# Patient Record
Sex: Female | Born: 1950
Health system: Southern US, Community
[De-identification: ages and names within clinical notes are randomized; demographics above are authoritative.]

## PROBLEM LIST (undated history)

## (undated) DIAGNOSIS — E7800 Pure hypercholesterolemia, unspecified: Secondary | ICD-10-CM

## (undated) DIAGNOSIS — I10 Essential (primary) hypertension: Secondary | ICD-10-CM

## (undated) HISTORY — DX: Essential (primary) hypertension: I10

---

## 2006-02-27 ENCOUNTER — Emergency Department: Payer: Self-pay | Admitting: Unknown Physician Specialty

## 2006-02-27 IMAGING — CR DG KNEE COMPLETE 4+V*R*
1 series · 4 of 4 positions shown · non-contrast
Comparison: none

REASON FOR EXAM: MULTIPLE FALLS
COMMENTS:

PROCEDURE:     DXR - DXR KNEE RT COMP WITH OBLIQUES  - [DATE]  [DATE]
RESULT:     No fracture, dislocation or other acute bony abnormality is
seen. The knee joint space is well maintained. The patella is intact.  There
is slight degenerative spurring about the knee medially.

[Series 1: view not recorded · 0.17mm/px · 4 of 4 slices shown]
[im 1/4]
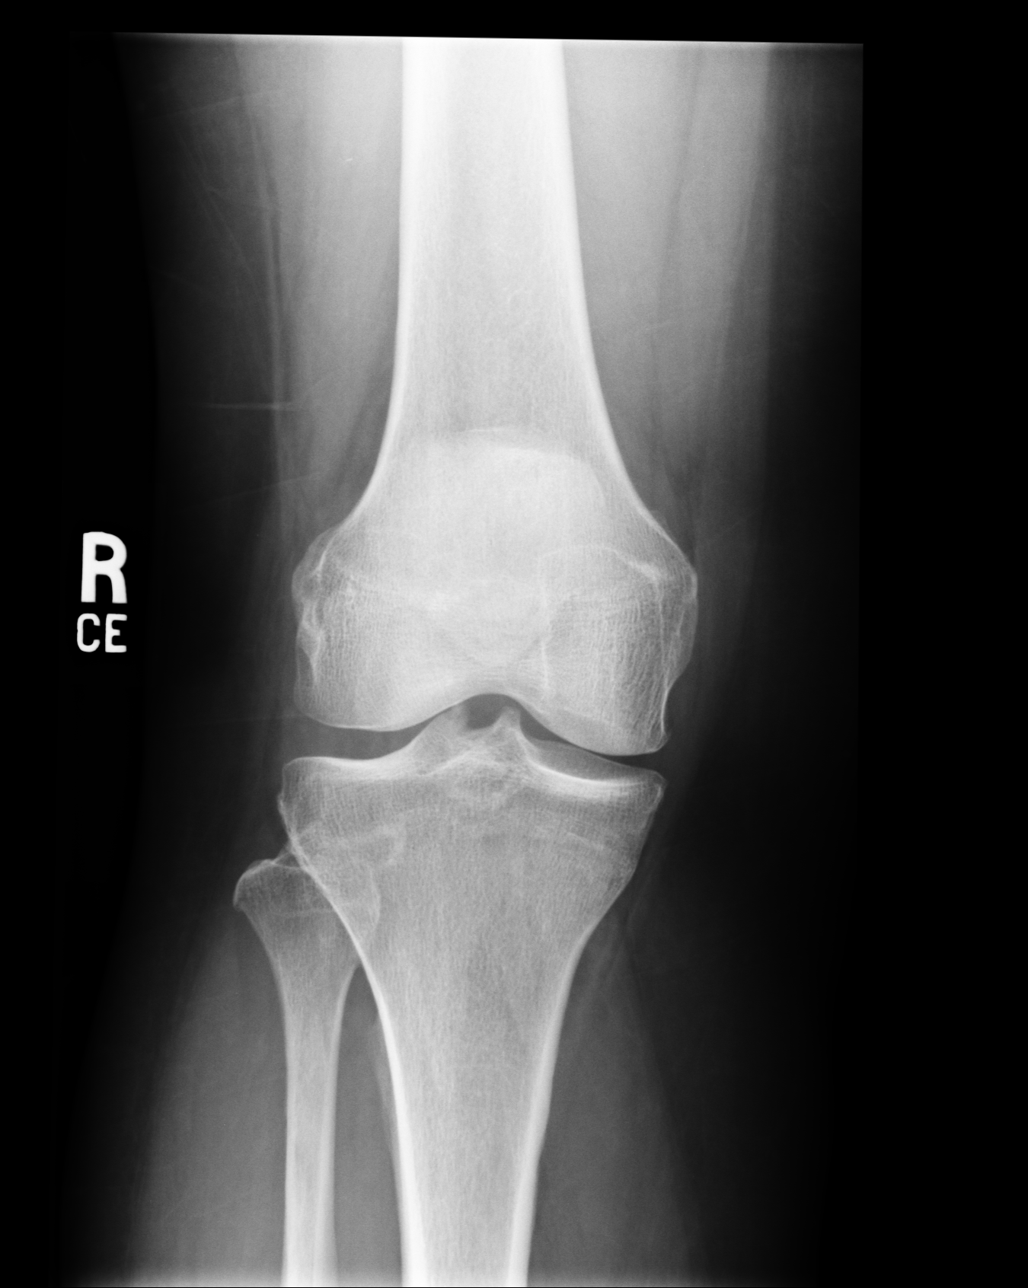
[im 2/4]
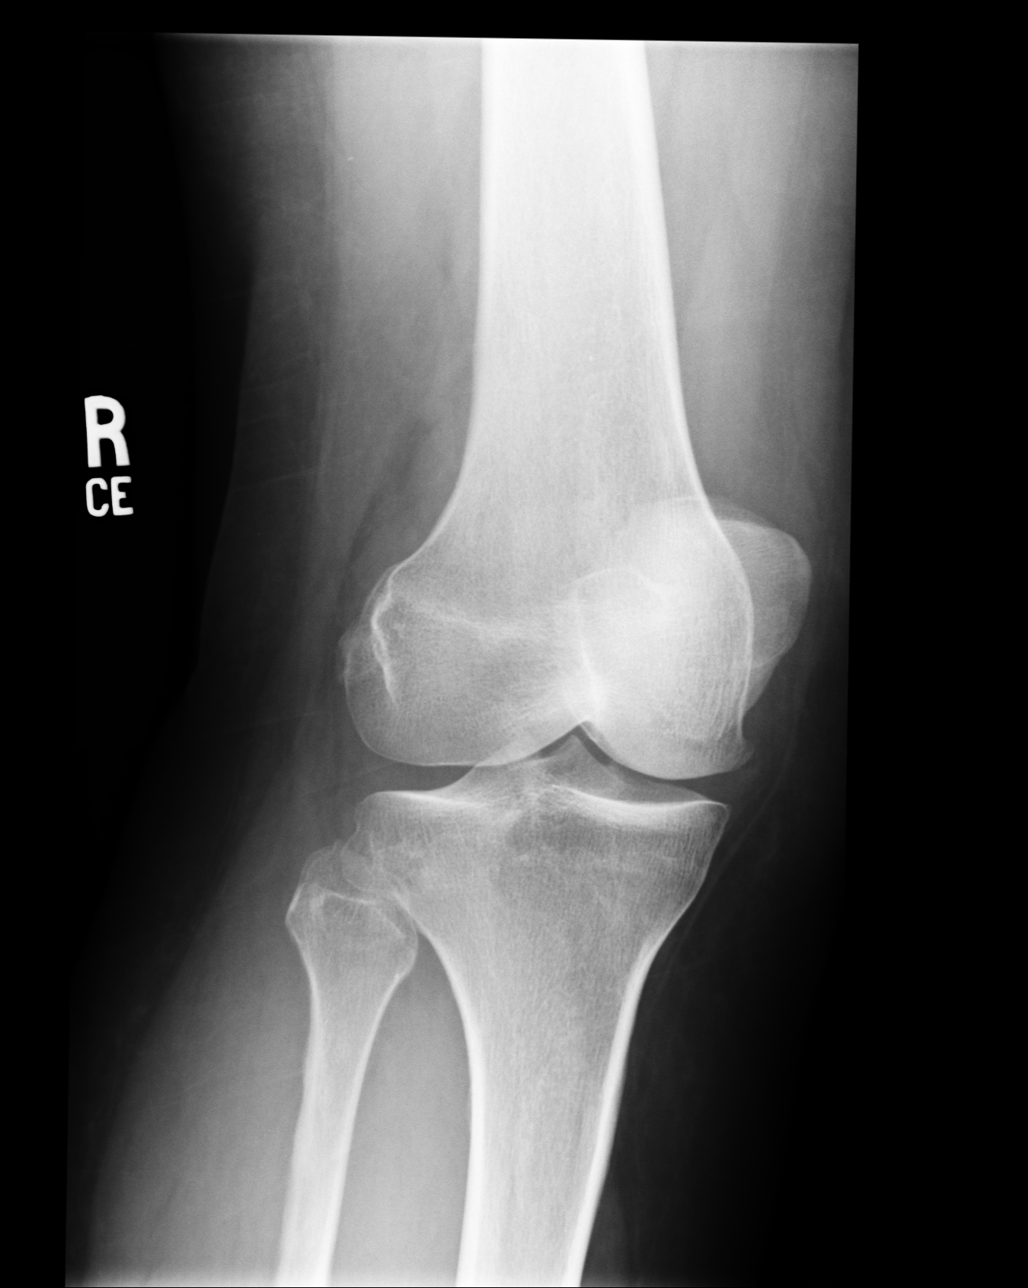
[im 3/4]
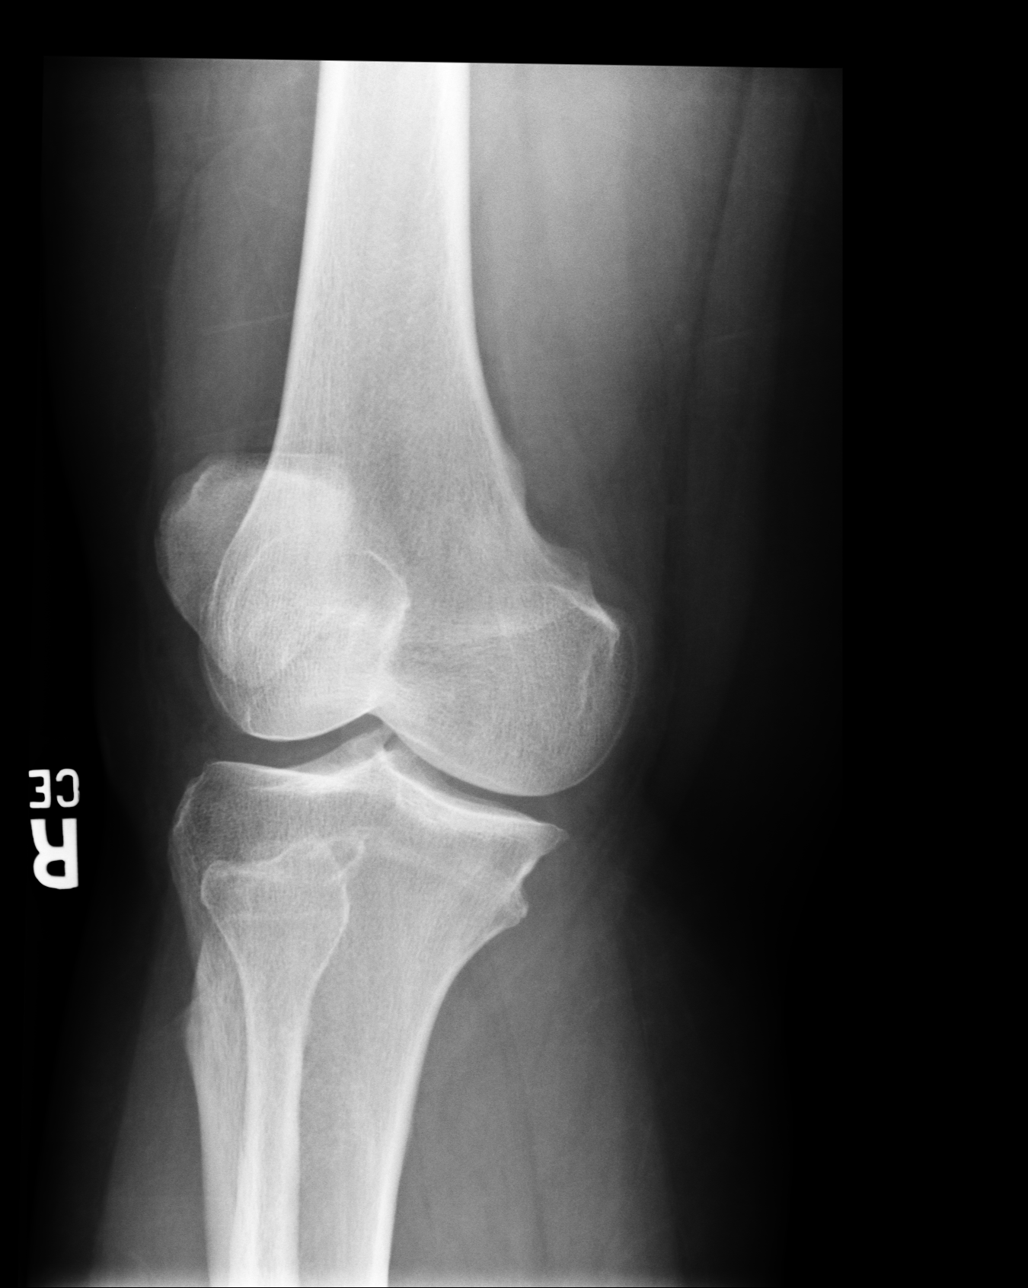
[im 4/4]
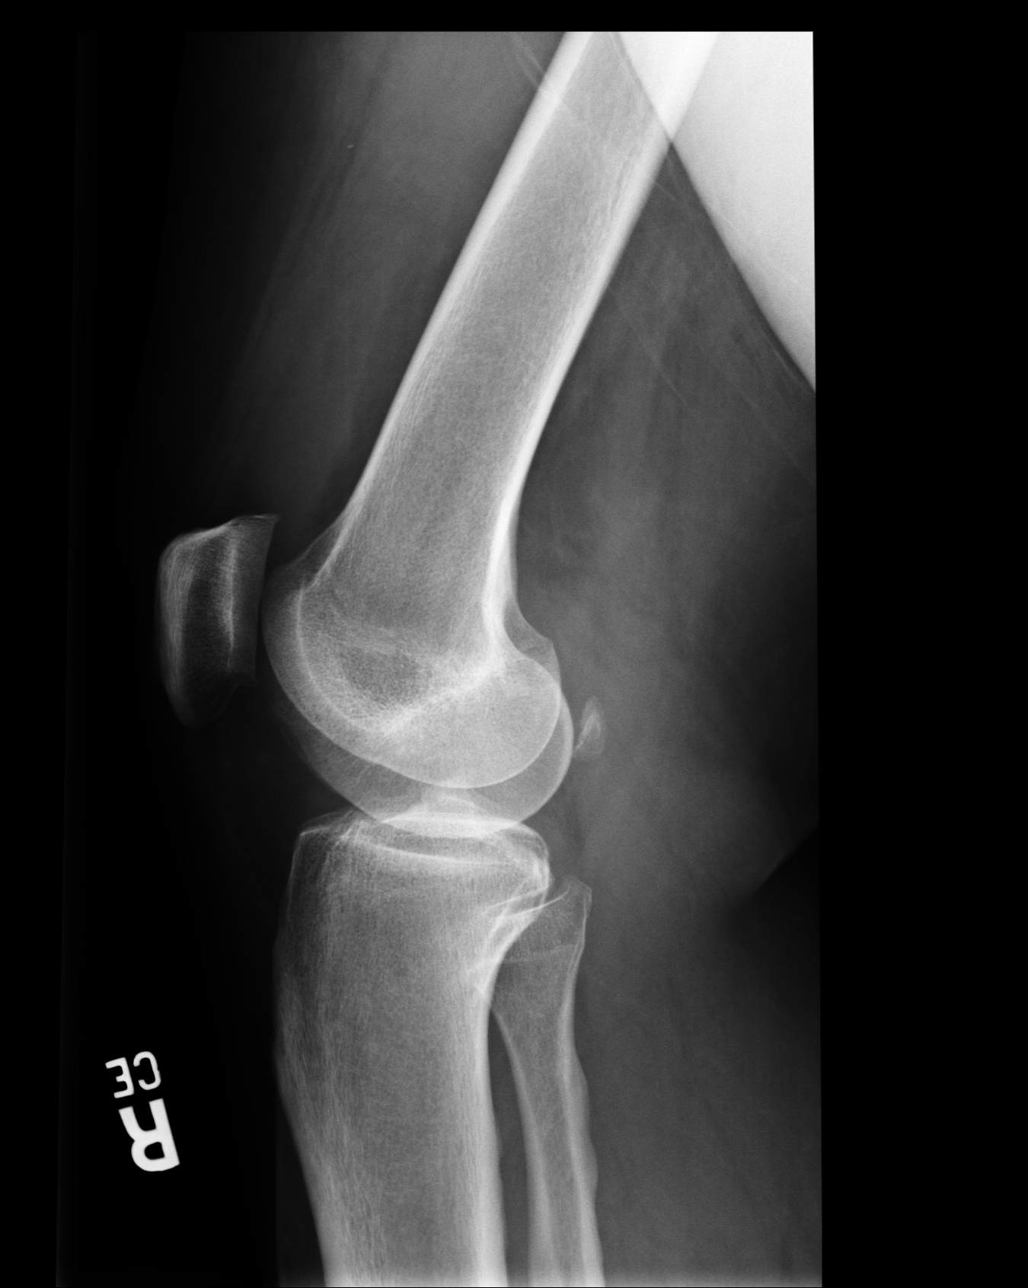

[4 of 4 positions shown; findings below may reference images not displayed]

IMPRESSION: 1)No acute bony abnormalities are seen.

2)There is slight degenerative spurring about the knee medially.

## 2007-03-08 ENCOUNTER — Other Ambulatory Visit: Payer: Self-pay

## 2007-03-08 ENCOUNTER — Emergency Department: Payer: Self-pay | Admitting: Emergency Medicine

## 2007-03-08 IMAGING — CR DG CHEST 2V
1 series · 2 of 2 positions shown · non-contrast
Comparison: none

REASON FOR EXAM: chest pain rm 7
COMMENTS:

[Series 1: view not recorded · 0.17mm/px · 2 of 2 slices shown]
[im 1/2]
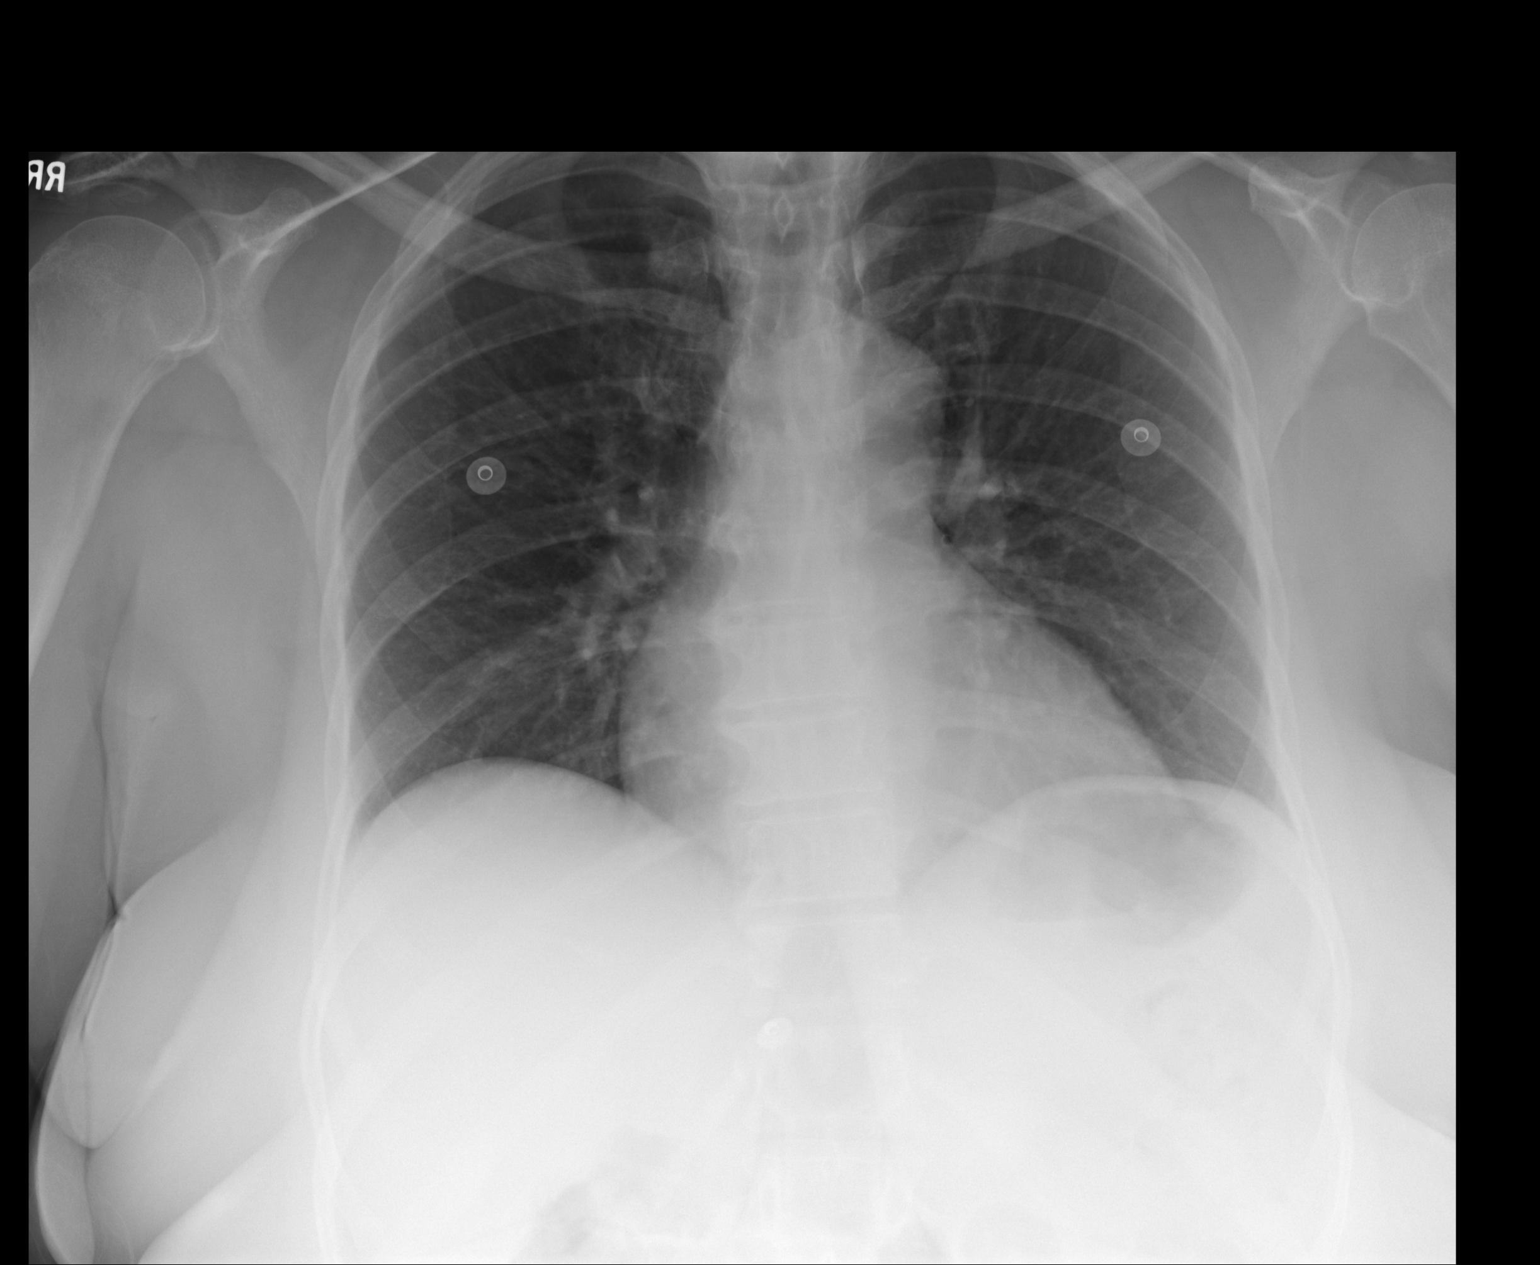
[im 2/2]
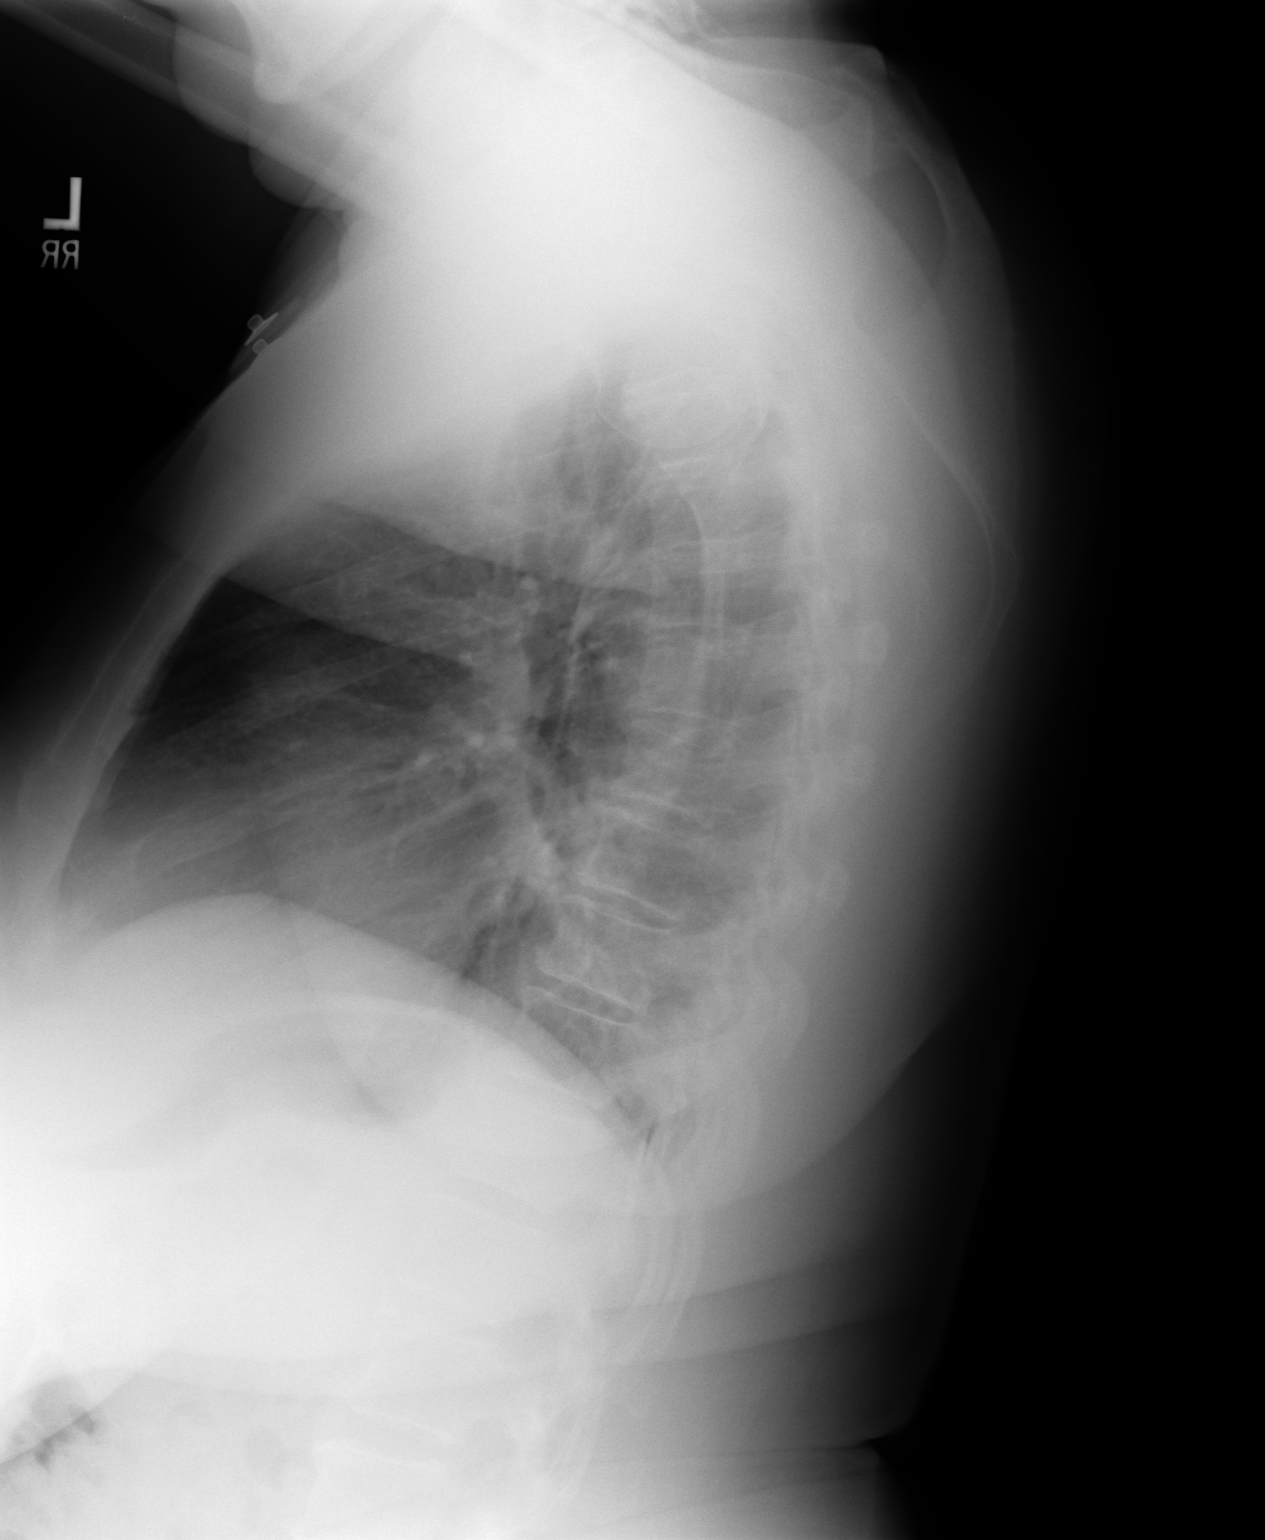

[2 of 2 positions shown; findings below may reference images not displayed]

PROCEDURE:     DXR - DXR CHEST PA (OR AP) AND LATERAL  - [DATE] [DATE]

RESULT:     The lungs are mildly hypoinflated. There is no focal infiltrate.
There is overlying soft tissue density which does increase the density of
the lung parenchyma over the thoracic spine on the lateral film. On the
frontal view this area is clear. The heart is not enlarged.
IMPRESSION: I do not see evidence of acute cardiopulmonary abnormality. Followup films
are recommended if patient has persistent symptoms.

## 2013-09-02 ENCOUNTER — Emergency Department: Payer: Self-pay | Admitting: Internal Medicine

## 2013-09-02 IMAGING — CR DG CHEST 2V
1 series · 2 of 2 positions shown · non-contrast
Comparison: none

REASON FOR EXAM: cough
COMMENTS:   LMP: Post-Menopausal

PROCEDURE:     DXR - DXR CHEST PA (OR AP) AND LATERAL  - [DATE]  [DATE]
RESULT:     The lungs are well-expanded and clear. The cardiac silhouette is
normal in size. The pulmonary vascularity is not engorged. The mediastinum
is normal in width. The observed portions of the bony thorax are normal.

[Series 1: w chest pa · 0.14mm/px · 2 of 2 slices shown]
[im 1/2]
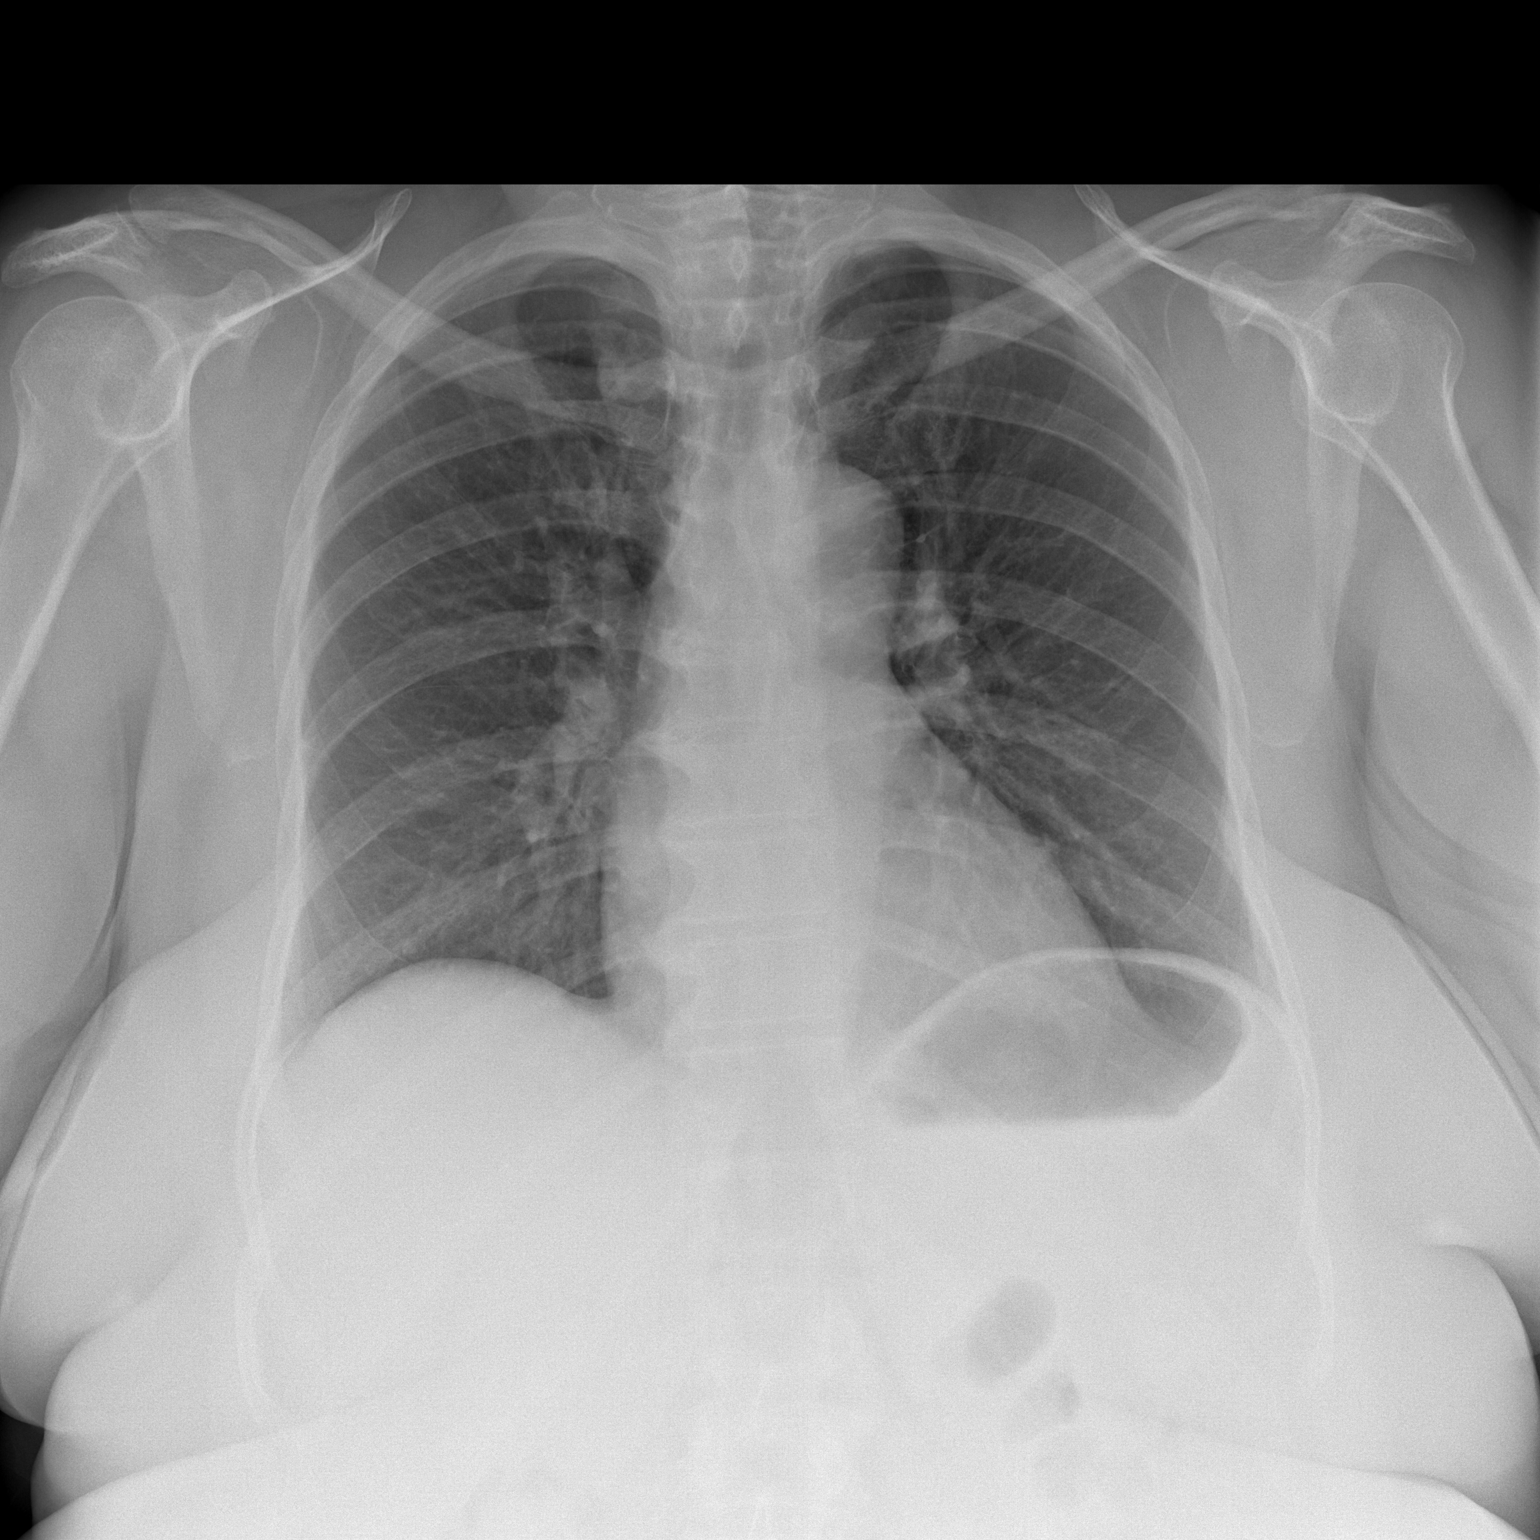
[im 2/2]
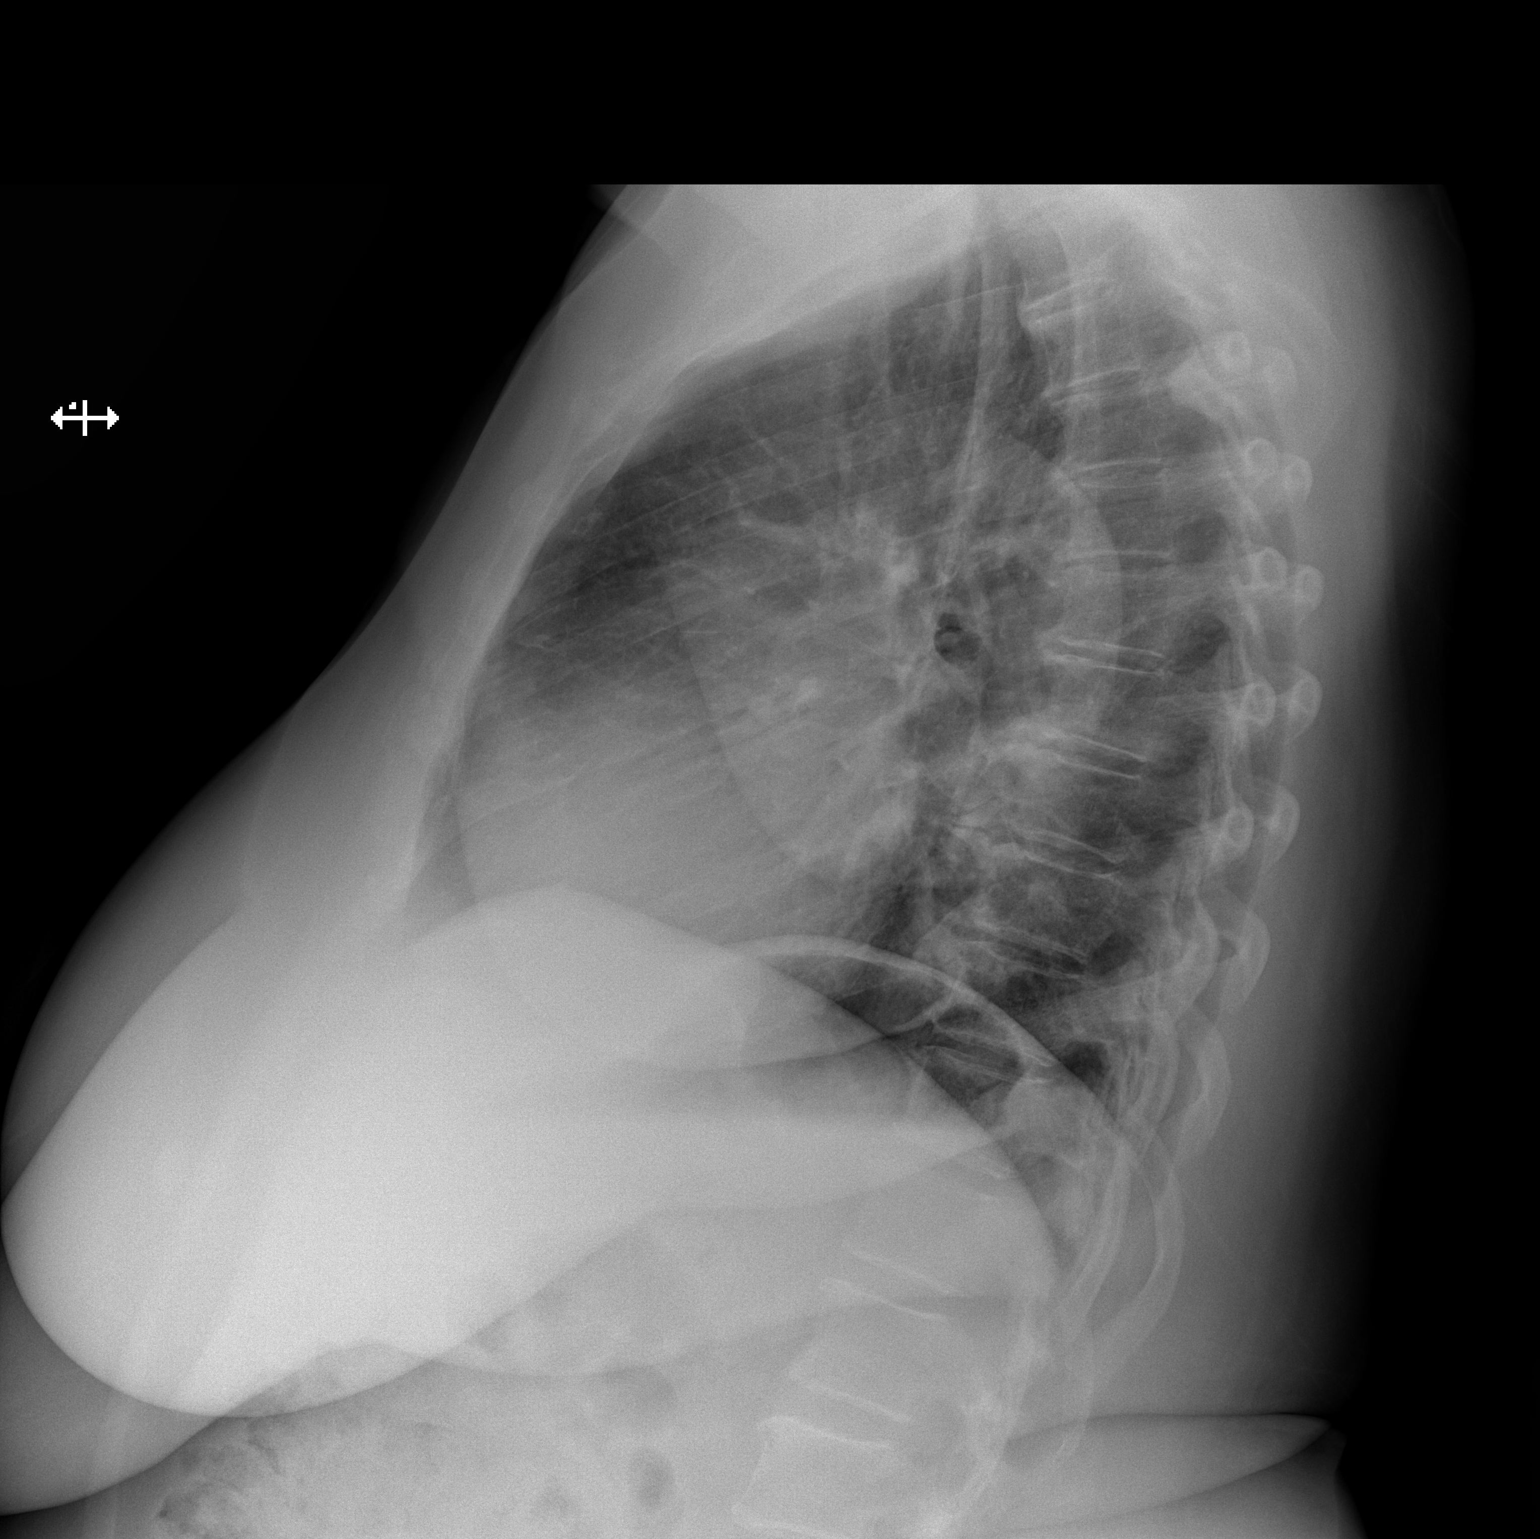

[2 of 2 positions shown; findings below may reference images not displayed]

IMPRESSION: There is no evidence of acute cardiopulmonary abnormality.

[REDACTED]

## 2013-12-13 ENCOUNTER — Emergency Department: Payer: Self-pay | Admitting: Emergency Medicine

## 2013-12-13 IMAGING — CR DG CHEST 2V
1 series · 2 of 2 positions shown · non-contrast
Comparison: PA and lateral chest [DATE].

CLINICAL DATA: Cough beginning [DATE].

EXAM:
CHEST  2 VIEW

[Series 1: w chest pa · 0.14mm/px · 2 of 2 slices shown]
[im 1/2]
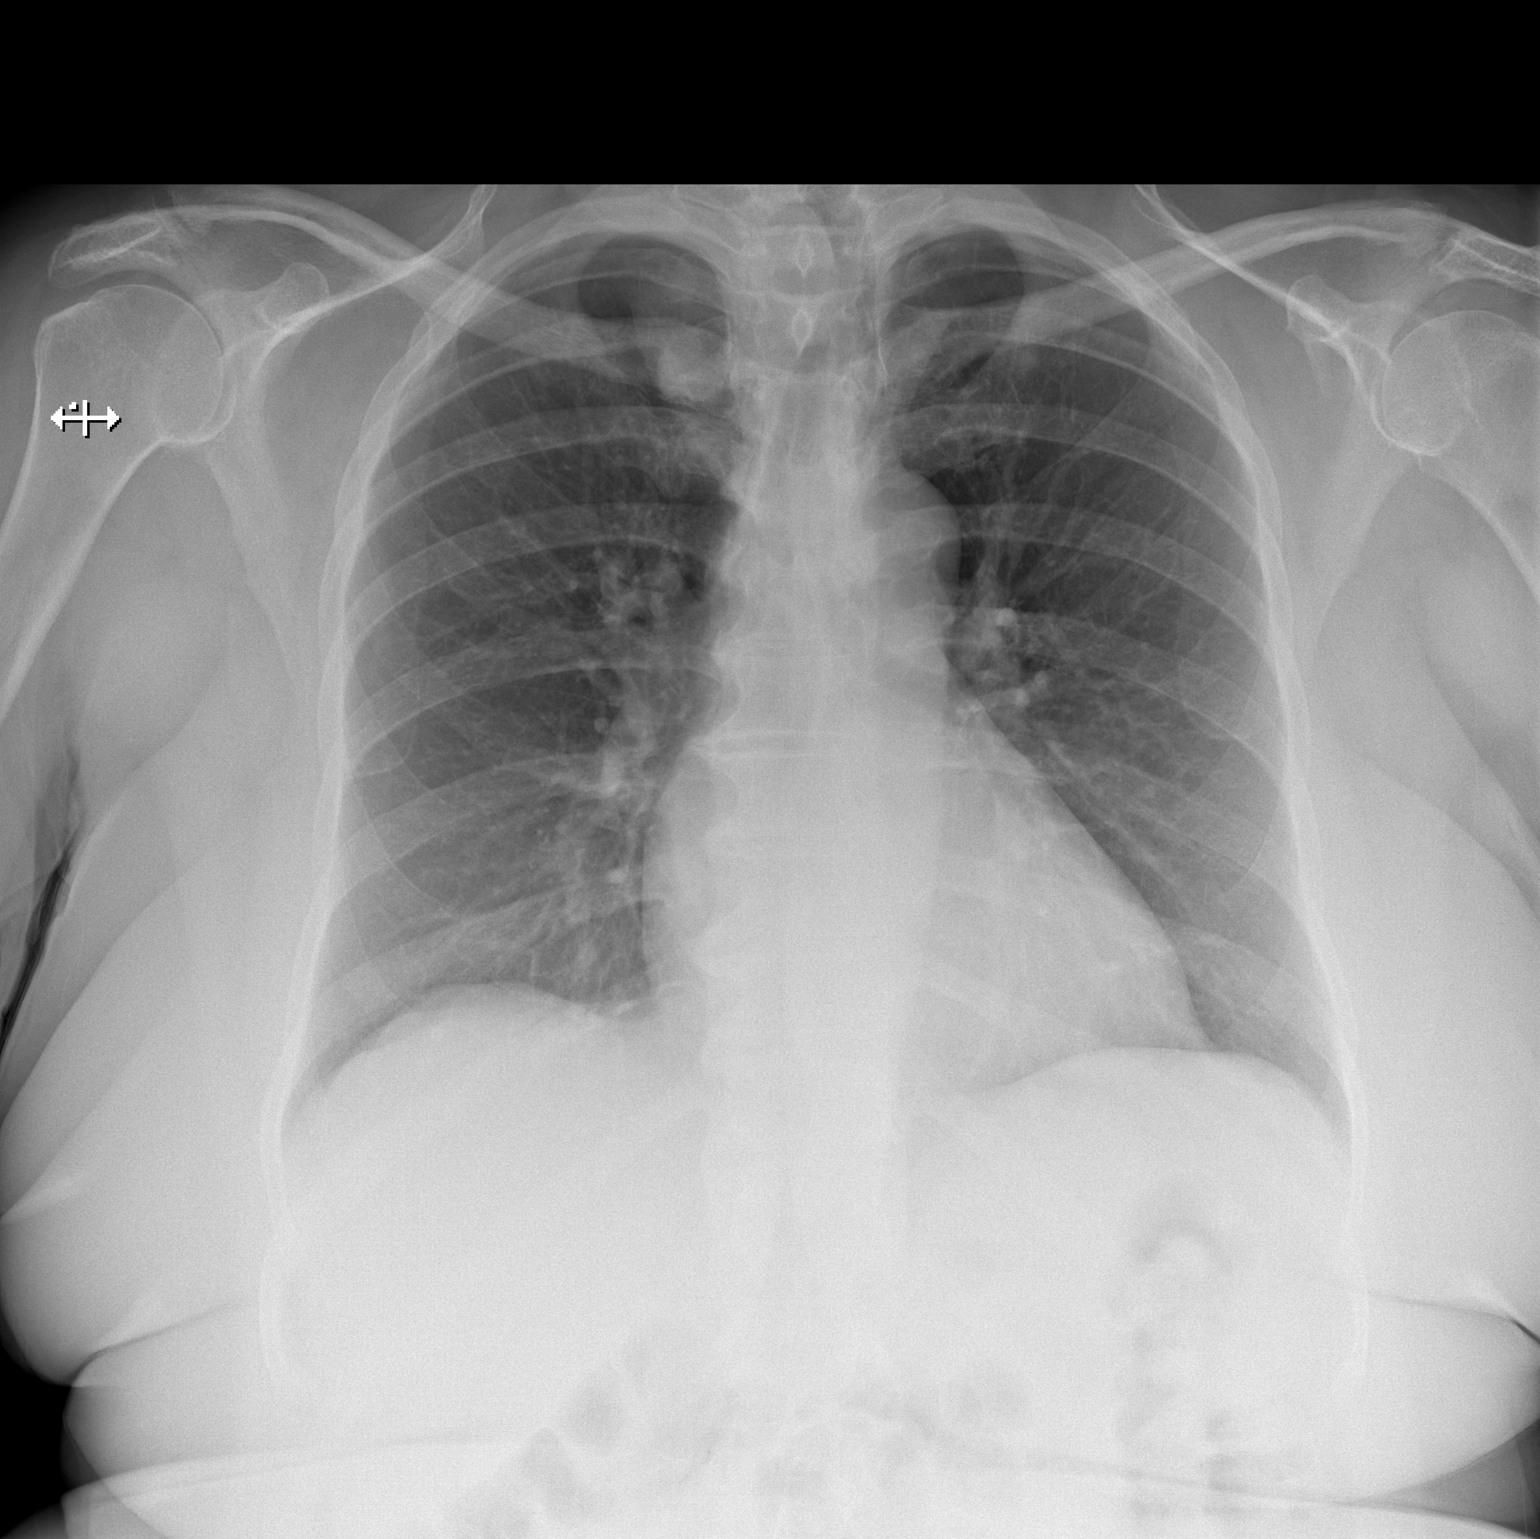
[im 2/2]
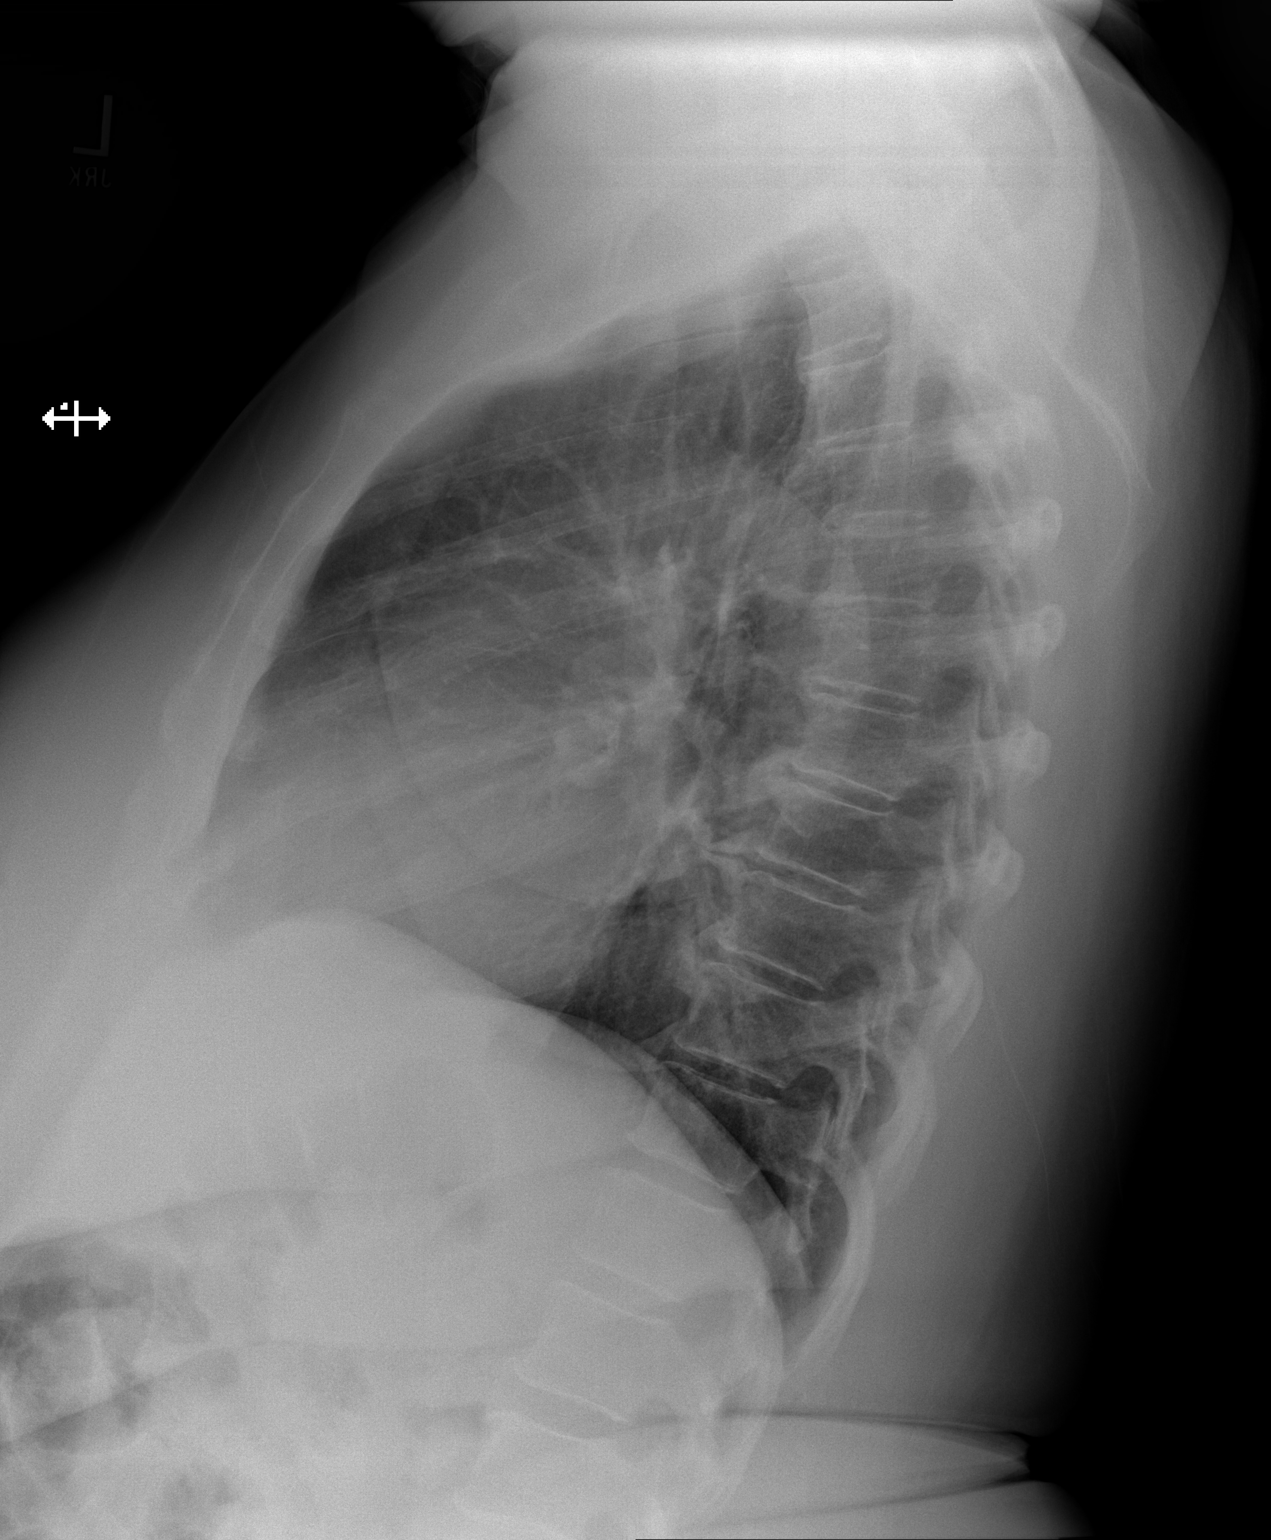

[2 of 2 positions shown; findings below may reference images not displayed]

FINDINGS: Heart size and mediastinal contours are within normal limits. Both
lungs are clear. Visualized skeletal structures are unremarkable.
IMPRESSION: Negative exam.

## 2013-12-14 LAB — RAPID INFLUENZA A&B ANTIGENS (ARMC ONLY)

## 2014-02-20 ENCOUNTER — Emergency Department: Payer: Self-pay | Admitting: Emergency Medicine

## 2014-02-20 LAB — TROPONIN I: Troponin-I: <0.02 ng/mL

## 2014-02-20 LAB — CBC WITH DIFFERENTIAL/PLATELET
BASOS ABS: 0.1 10*3/uL (ref 0.0–0.1)
Basophil %: 1.1 %
EOS PCT: 0.4 %
Eosinophil #: 0 10*3/uL (ref 0.0–0.7)
HCT: 38 % (ref 35.0–47.0)
HGB: 12.6 g/dL (ref 12.0–16.0)
Lymphocyte #: 1.6 10*3/uL (ref 1.0–3.6)
Lymphocyte %: 29 %
MCH: 28.7 pg (ref 26.0–34.0)
MCHC: 33 g/dL (ref 32.0–36.0)
MCV: 87 fL (ref 80–100)
MONOS PCT: 6.3 %
Monocyte #: 0.3 x10 3/mm (ref 0.2–0.9)
NEUTROS ABS: 3.4 10*3/uL (ref 1.4–6.5)
Neutrophil %: 63.2 %
Platelet: 324 10*3/uL (ref 150–440)
RBC: 4.37 10*6/uL (ref 3.80–5.20)
RDW: 15.1 % — AB (ref 11.5–14.5)
WBC: 5.4 10*3/uL (ref 3.6–11.0)

## 2014-02-20 LAB — URINALYSIS, COMPLETE
BLOOD: NEGATIVE
Bacteria: NONE SEEN
Bilirubin,UR: NEGATIVE
Glucose,UR: NEGATIVE mg/dL (ref 0–75)
Ketone: NEGATIVE
LEUKOCYTE ESTERASE: NEGATIVE
NITRITE: NEGATIVE
PH: 7 (ref 4.5–8.0)
PROTEIN: NEGATIVE
Specific Gravity: 1.01 (ref 1.003–1.030)
Squamous Epithelial: NONE SEEN

## 2014-02-20 LAB — BASIC METABOLIC PANEL
Anion Gap: 5 — ABNORMAL LOW (ref 7–16)
BUN: 11 mg/dL (ref 7–18)
CALCIUM: 9.1 mg/dL (ref 8.5–10.1)
Chloride: 106 mmol/L (ref 98–107)
Co2: 27 mmol/L (ref 21–32)
Creatinine: 0.71 mg/dL (ref 0.60–1.30)
GLUCOSE: 97 mg/dL (ref 65–99)
Osmolality: 275 (ref 275–301)
POTASSIUM: 4.2 mmol/L (ref 3.5–5.1)
SODIUM: 138 mmol/L (ref 136–145)

## 2014-02-20 IMAGING — CR DG CHEST 1V PORT
1 series · 1 of 1 positions shown · non-contrast
Comparison: DG CHEST 2V dated [DATE]; DG CHEST 2V dated
[DATE]; DG CHEST 2V dated [DATE]

CLINICAL DATA: Weakness and dizziness.

EXAM:
PORTABLE CHEST - 1 VIEW

[ap]
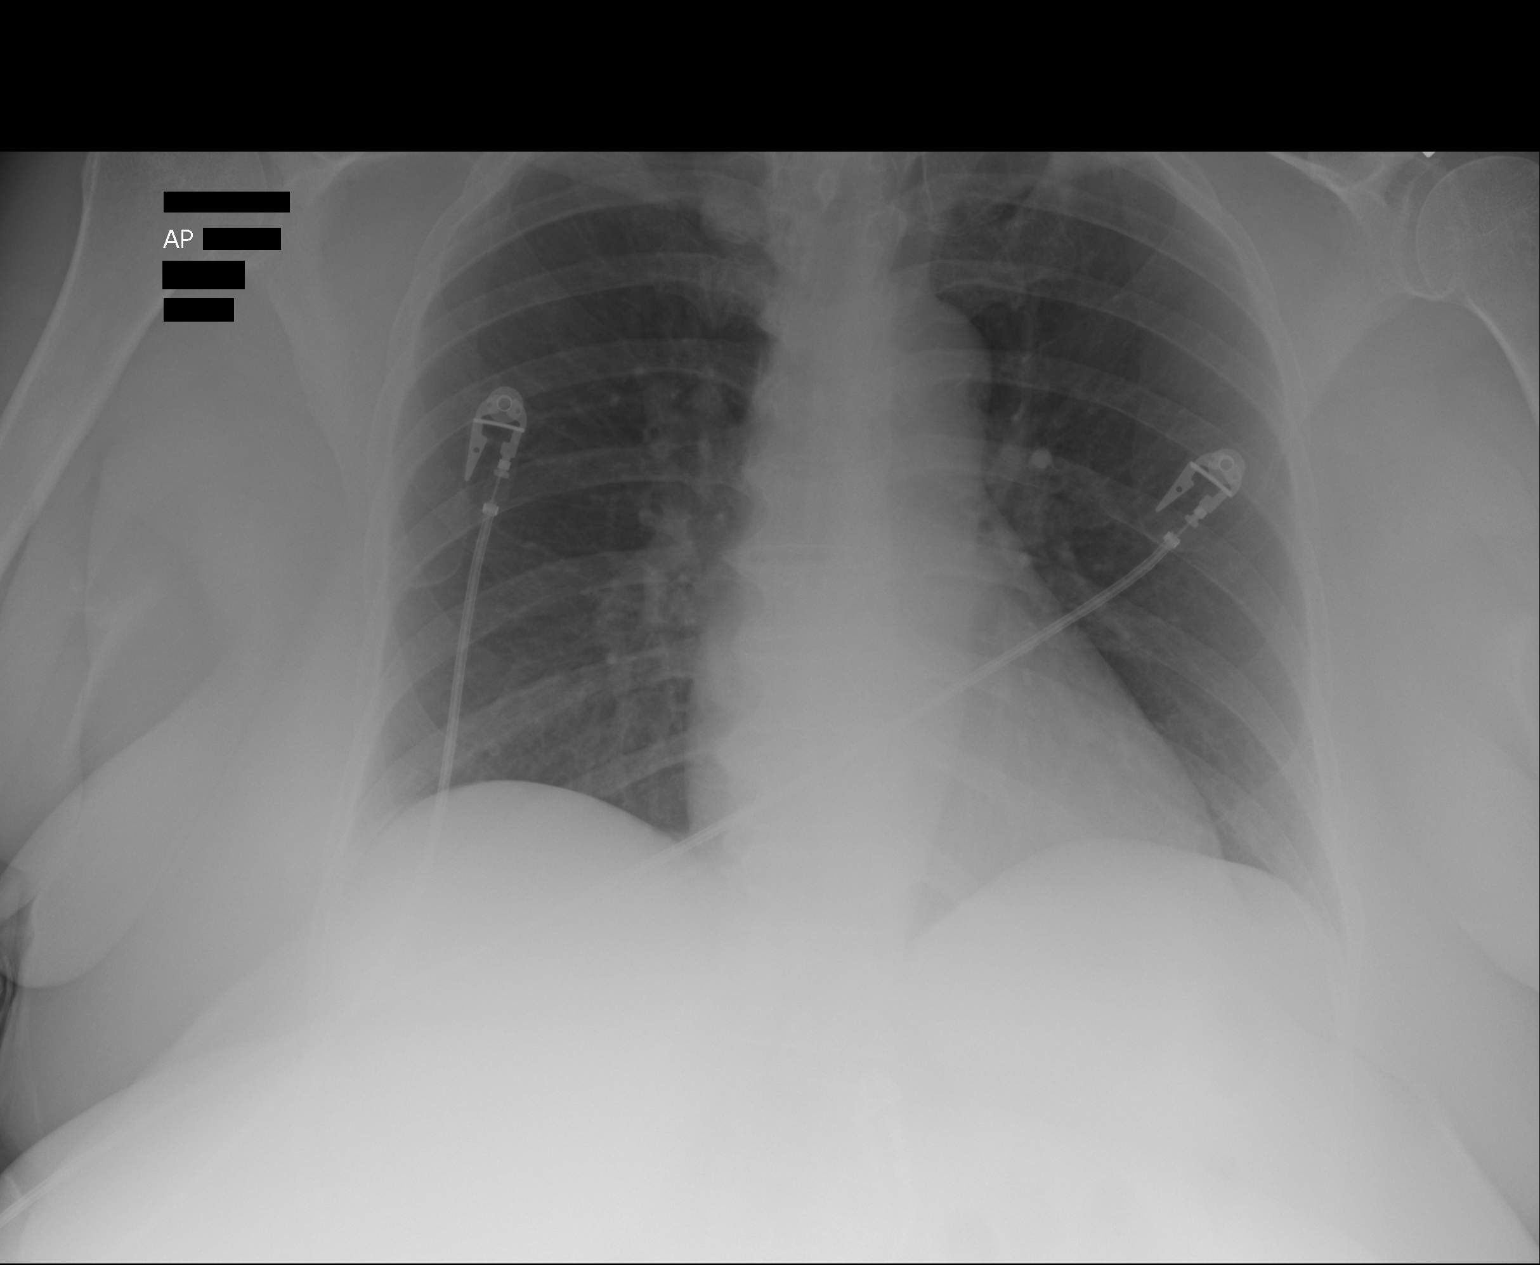

[1 of 1 positions shown; findings below may reference images not displayed]

FINDINGS: Both lungs are clear. Heart and mediastinum are within normal
limits. Again noted is leftward deviation of the trachea in the
upper chest. There has been at least mild tracheal deviation since
[FL] and findings could be related to an enlarged thyroid.
Degenerative changes in the AC joints bilaterally.
IMPRESSION: No acute chest findings.

Tracheal deviation as described. This could be related to an
enlarged thyroid gland but nonspecific.

## 2014-02-20 IMAGING — CT CT HEAD WITHOUT CONTRAST
1 series · 16 of 30 positions shown, 20 images · non-contrast
Comparison: None.

CLINICAL DATA: Dizziness since last night, vomiting, symptoms worse
with standing, hypertensive

EXAM:
CT HEAD WITHOUT CONTRAST
TECHNIQUE: Contiguous axial images were obtained from the base of the skull
through the vertex without intravenous contrast.

[Series 2: head wo · axial · 0.44mm/px · z∈[+1281,+1416]mm · 16 of 34 slices shown, 20 images]
[im 2/34  brain]
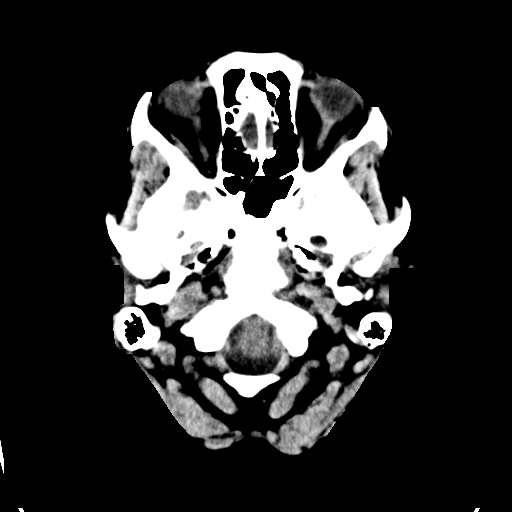
[im 2/34  bone]
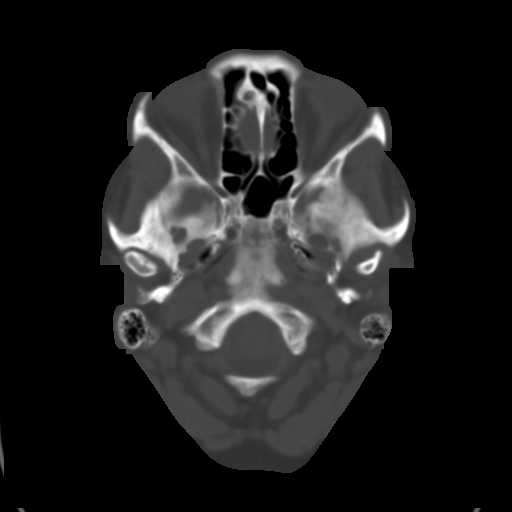
[im 4/34  brain]
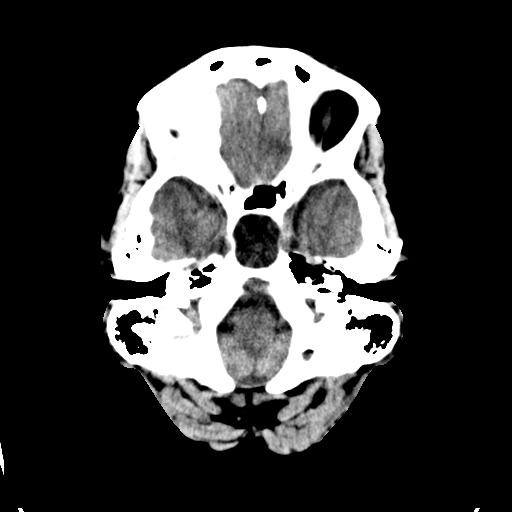
[im 6/34  brain]
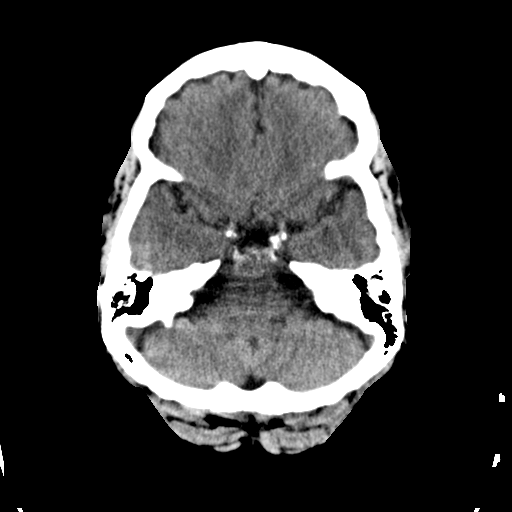
[im 8/34  brain]
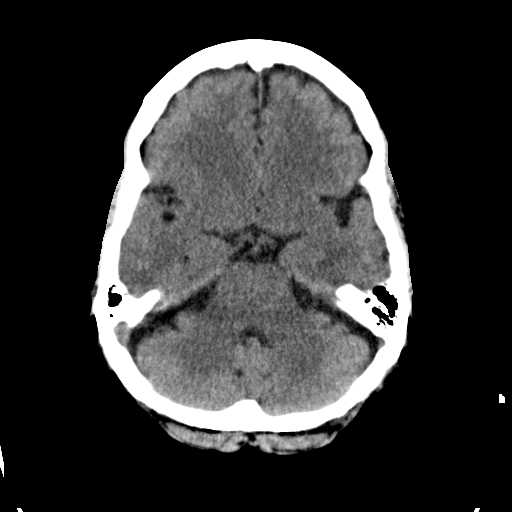
[im 10/34  brain]
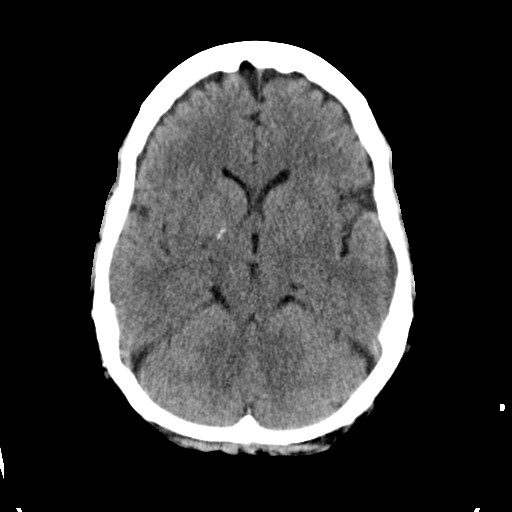
[im 10/34  bone]
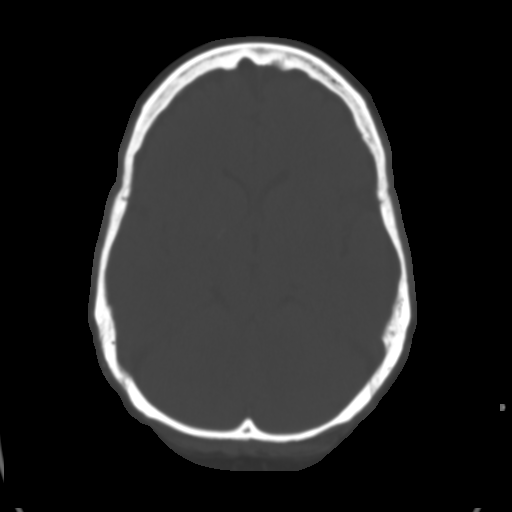
[im 12/34  brain]
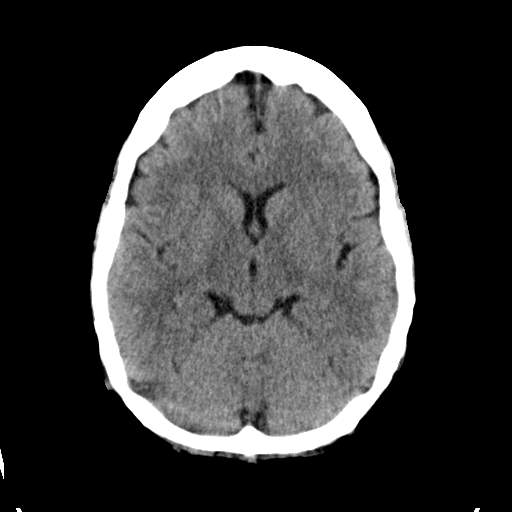
[im 14/34  brain]
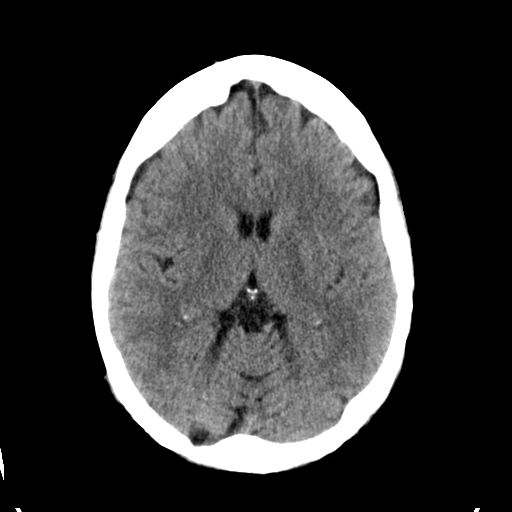
[im 16/34  brain]
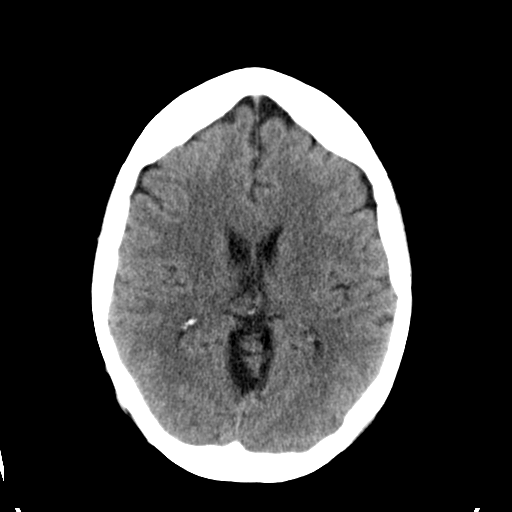
[im 18/34  brain]
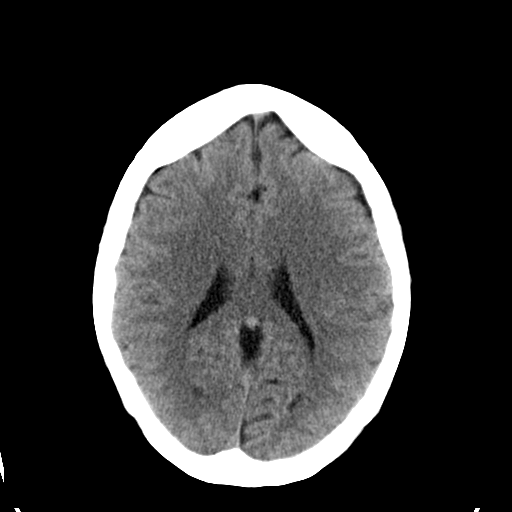
[im 18/34  bone]
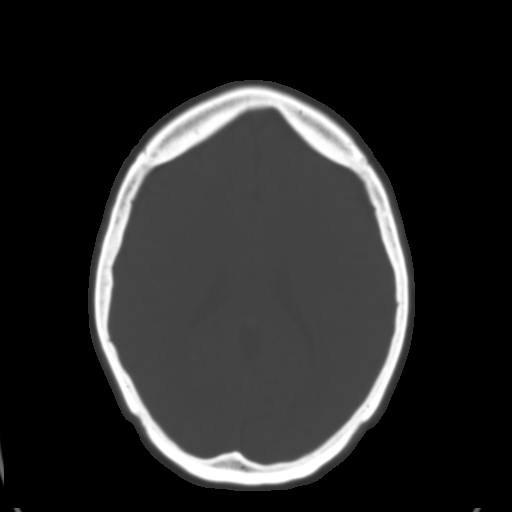
[im 20/34  brain]
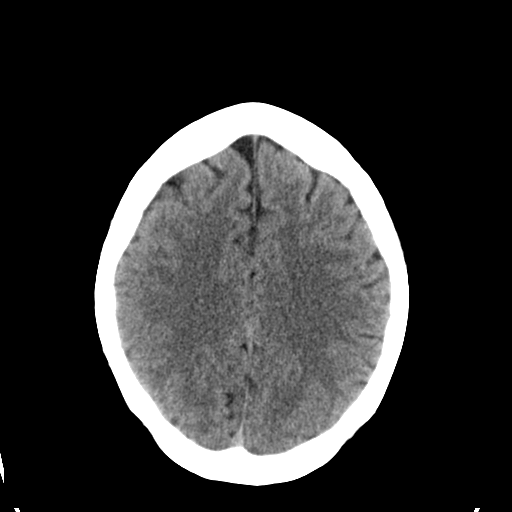
[im 22/34  brain]
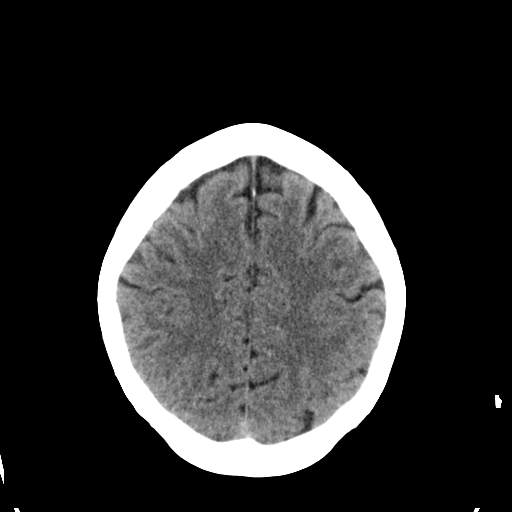
[im 24/34  brain]
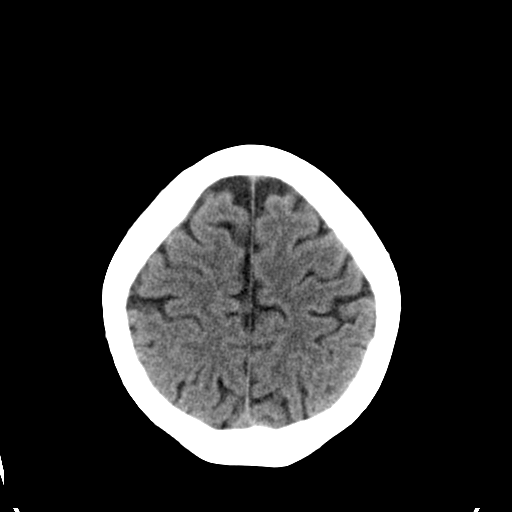
[im 26/34  brain]
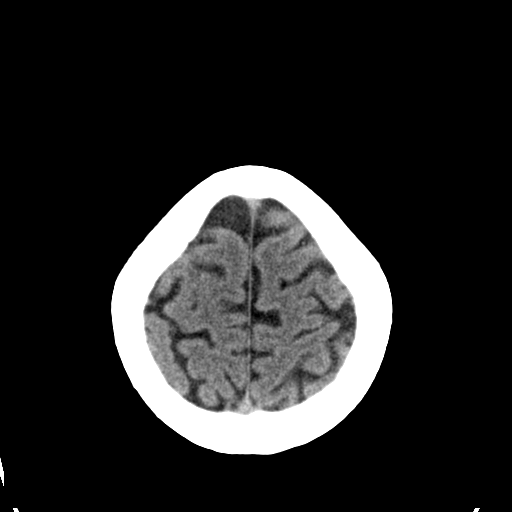
[im 26/34  bone]
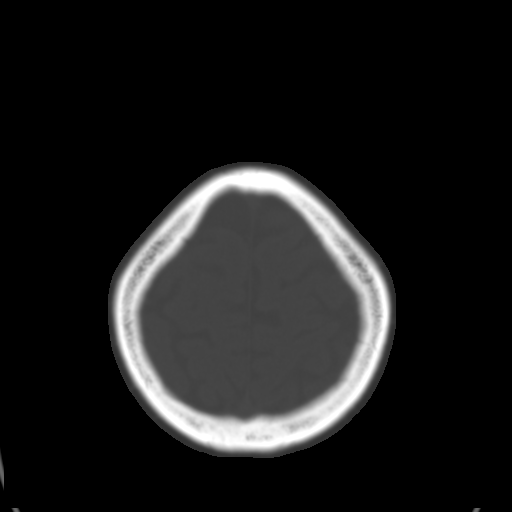
[im 28/34  brain]
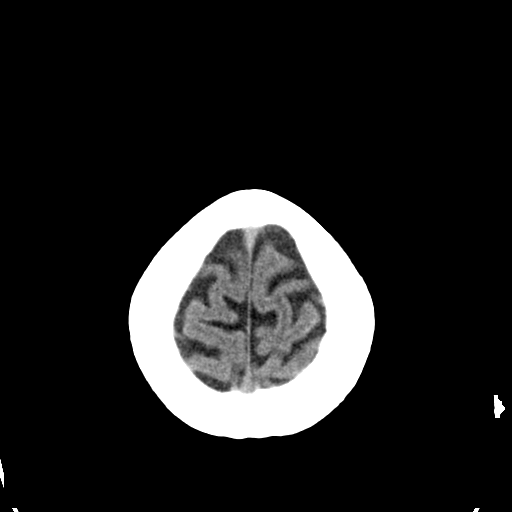
[im 30/34  brain]
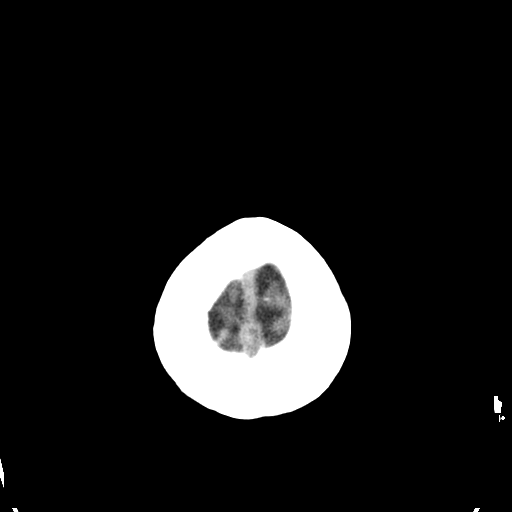
[im 32/34  brain]
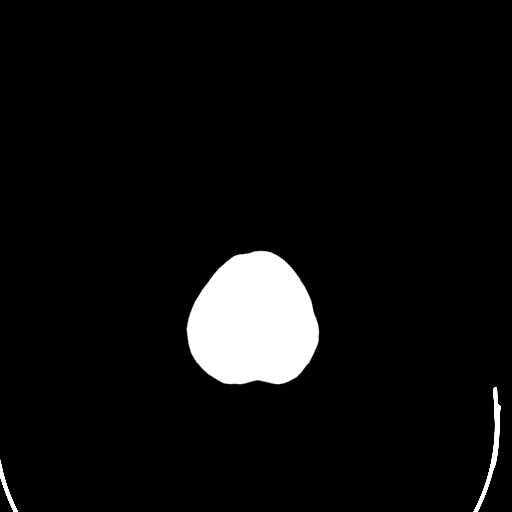

[16 of 30 positions shown; findings below may reference images not displayed]

FINDINGS: Normal ventricular morphology.

No midline shift or mass effect.

Normal appearance of brain parenchyma.

No intracranial hemorrhage, mass lesion or evidence acute
infarction.

No extra-axial fluid collections.

Sinuses clear.

Calvaria intact.

Markedly enlarged sella turcica 2.7 x 2.2 cm without definite soft
tissue mass.
IMPRESSION: No acute intracranial abnormalities.

Markedly enlarged sella turcica 2.7 x 2.2 cm without definite soft
tissue mass.

While this may represent an empty sella, consider followup
nonemergent MR imaging to further assess for other etiologies such
as arachnoid cyst or other hypodense mass.

## 2014-03-03 ENCOUNTER — Ambulatory Visit: Payer: Self-pay | Admitting: Family Medicine

## 2014-03-03 IMAGING — US THYROID ULTRASOUND
1 series · 13 of 25 positions shown · non-contrast
Comparison: None.

CLINICAL DATA: Enlarged thyroid

Findings meet consensus criteria for biopsy. Ultrasound-guided fine
needle aspiration should be considered, as per the consensus
statement: Management of Thyroid Nodules Detected at US: Society of
[4R]; [DATE].
EXAM:
THYROID ULTRASOUND
TECHNIQUE: Ultrasound examination of the thyroid gland and adjacent soft
tissues was performed.

[Series 1: thyroid ultrasound · 0.10mm/px · 13 of 65 slices shown]
[im 1/65]
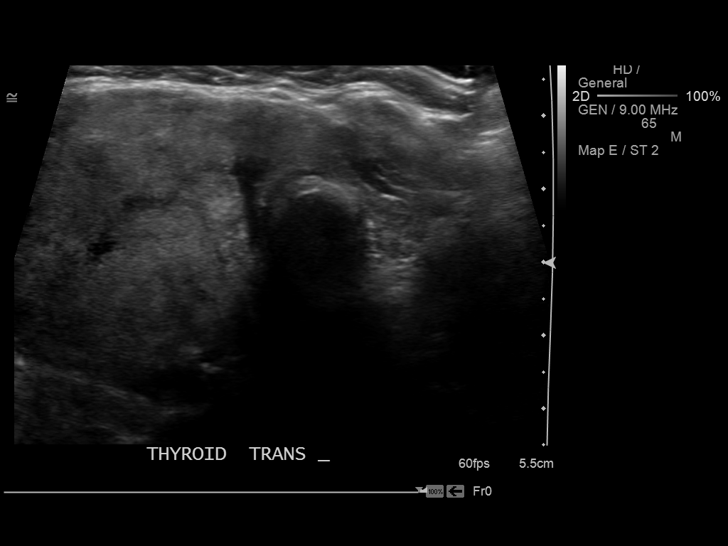
[im 6/65]
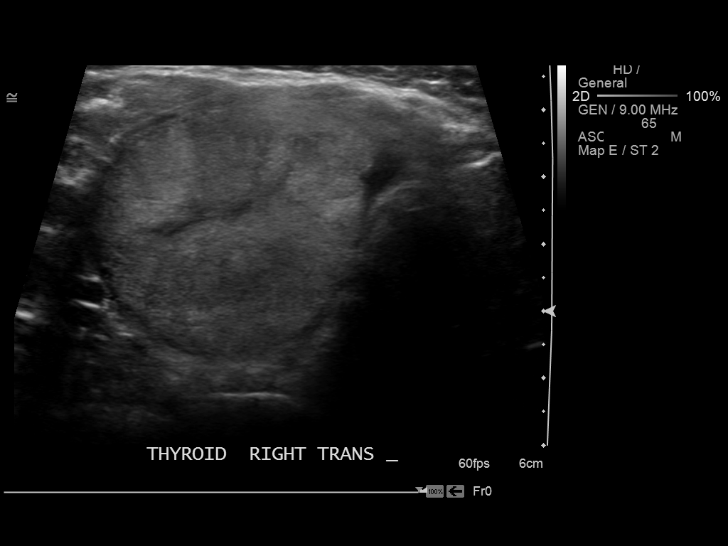
[im 11/65]
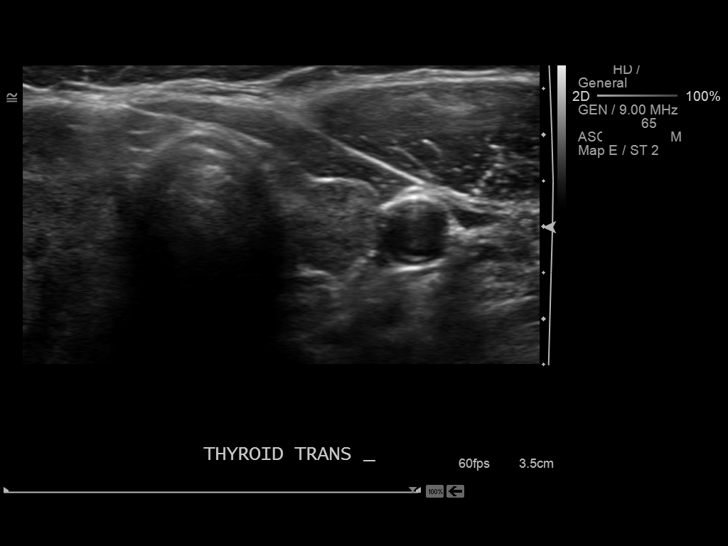
[im 17/65]
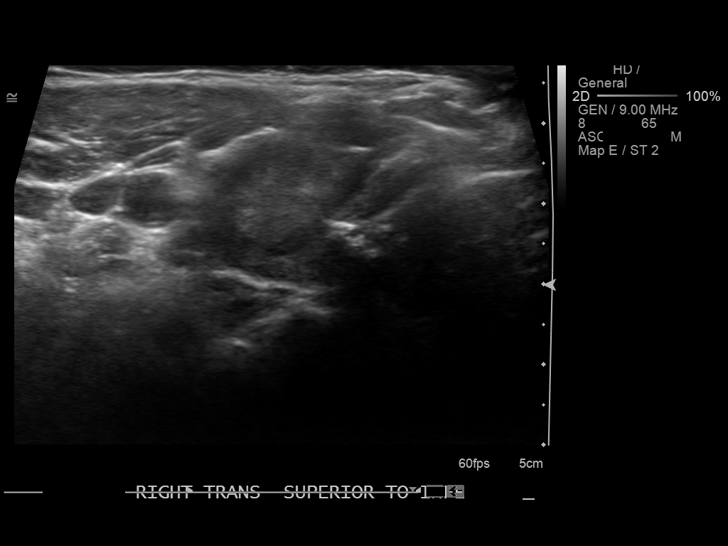
[im 22/65]
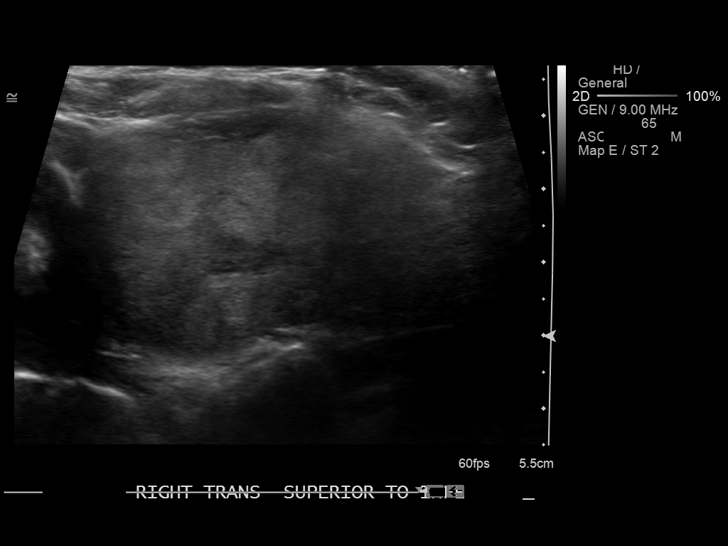
[im 27/65]
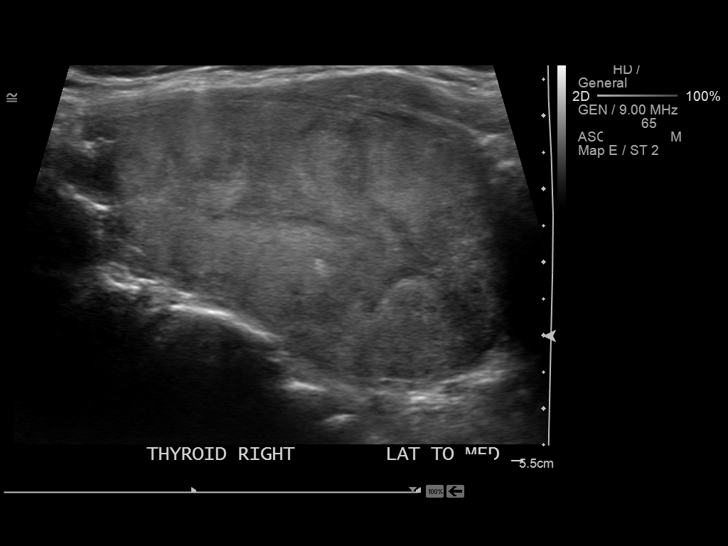
[im 33/65]
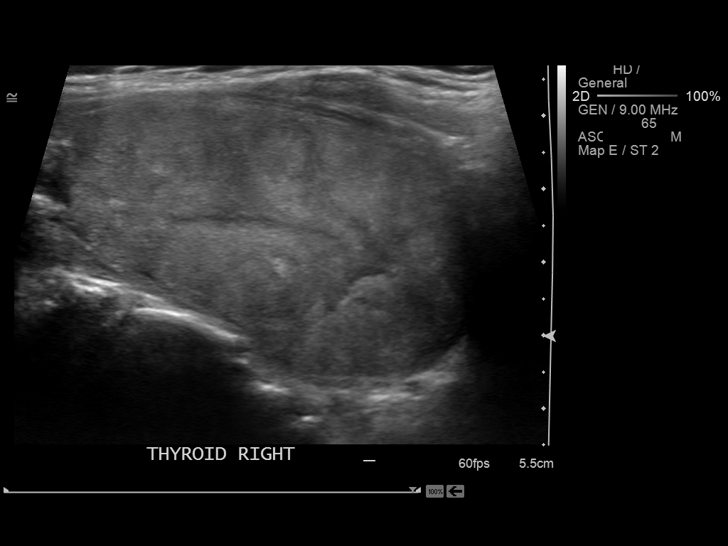
[im 38/65]
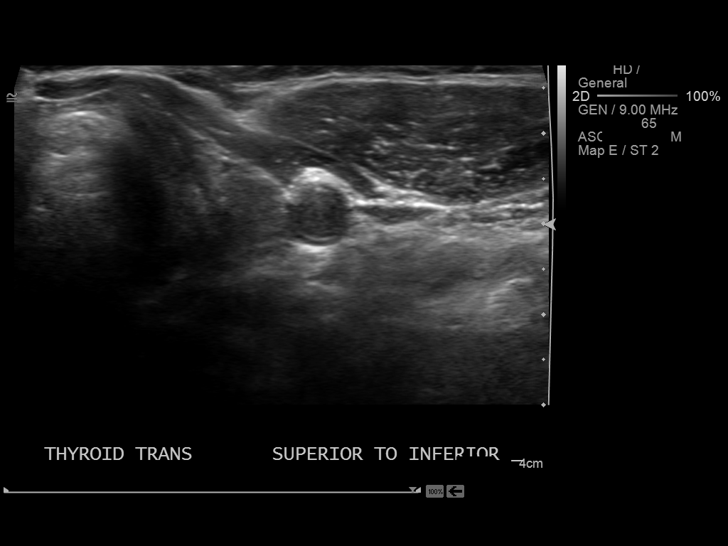
[im 43/65]
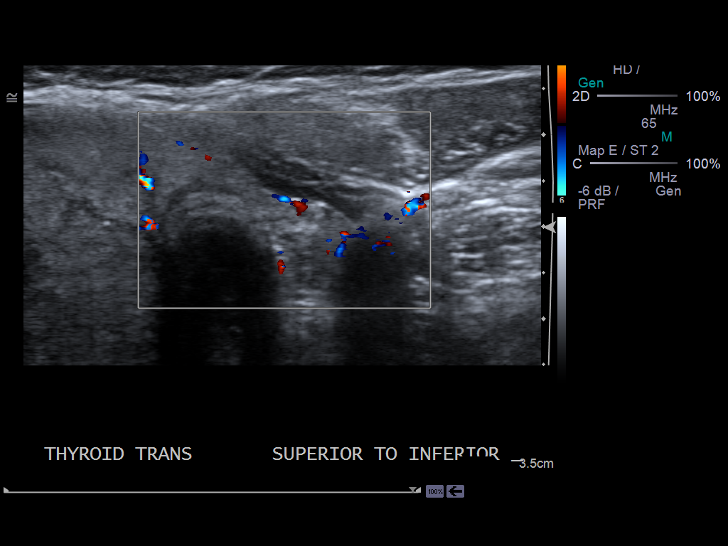
[im 49/65]
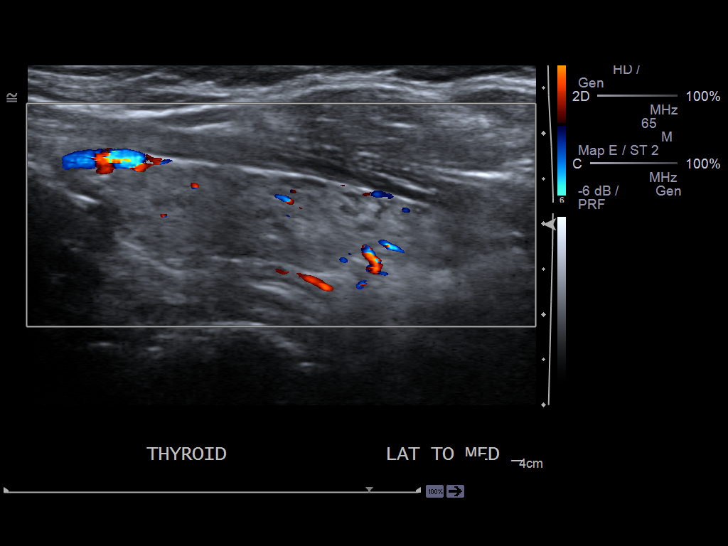
[im 54/65]
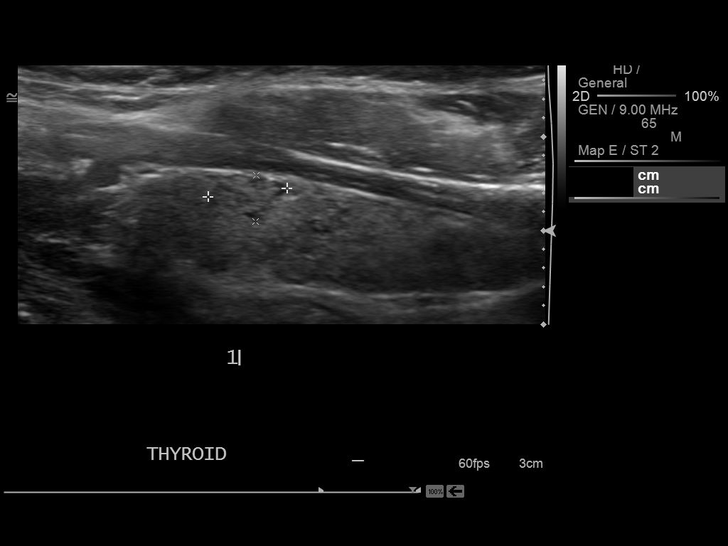
[im 59/65]
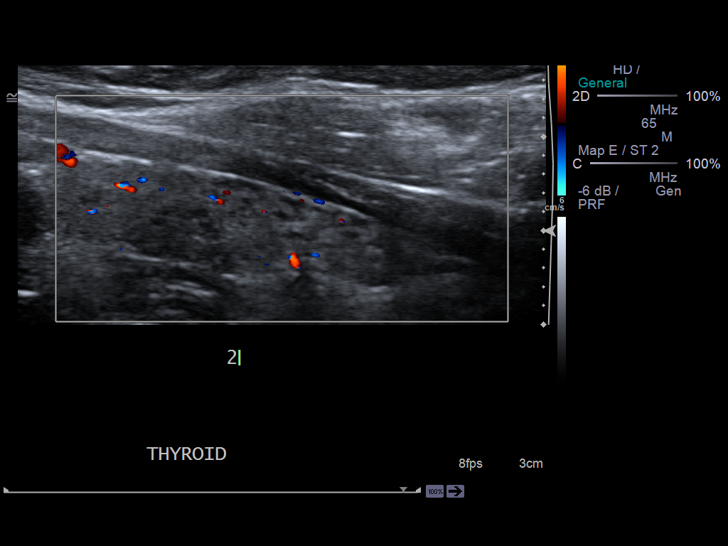
[im 65/65]
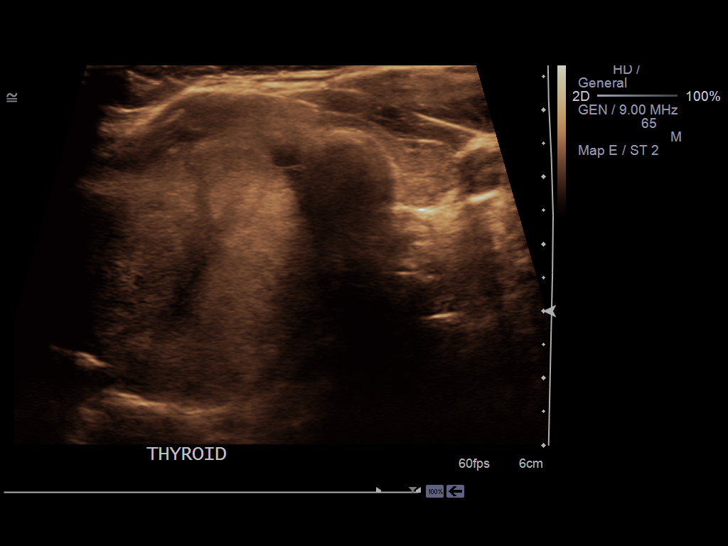

[13 of 25 positions shown; findings below may reference images not displayed]

FINDINGS: Right lobe measures 7.1 x 1.3 x 4.1 cm. Left lobe measures 4.9 x
x 1.1 cm. Isthmus measures 4 mm in thickness.

There is a dominant solid mass occupying much of the right lobe of
the thyroid measuring 5.9 x 3.5 x 5.2 cm. In the left lobe, there is
a 0.9 x 0.5 x 0.5 cm mass in the upper to midpole left lobe. In the
lower pole left lobe, there is a 1.3 x 0.5 x 0.8 cm mass. There are
microcalcifications in this lower pole left lobe mass. No other
calcifications are noted.

There is no surrounding adenopathy or mass.
IMPRESSION: Dominant solid mass occupying much of the right lobe of the thyroid.
Given the size of this mass, tissue sampling would be advisable.

There is a smaller mass in the left lobe inferiorly which contains
calcification. Given the calcification in this lesion, suspicion for
underlying thyroid neoplasia is heightened. It may be reasonable to
consider tissue sampling of this lesion is well given these
calcifications within the lesion.

## 2014-03-03 IMAGING — US US CAROTID DUPLEX BILAT
1 series · 13 of 24 positions shown · non-contrast
Comparison: None.

CLINICAL DATA: Bruit

EXAM:
BILATERAL CAROTID DUPLEX ULTRASOUND
TECHNIQUE: Gray scale imaging, color Doppler and duplex ultrasound were
performed of bilateral carotid and vertebral arteries in the neck.

[Series 1: us carotid duplex bilat · 0.08mm/px · 13 of 75 slices shown]
[im 1/75]
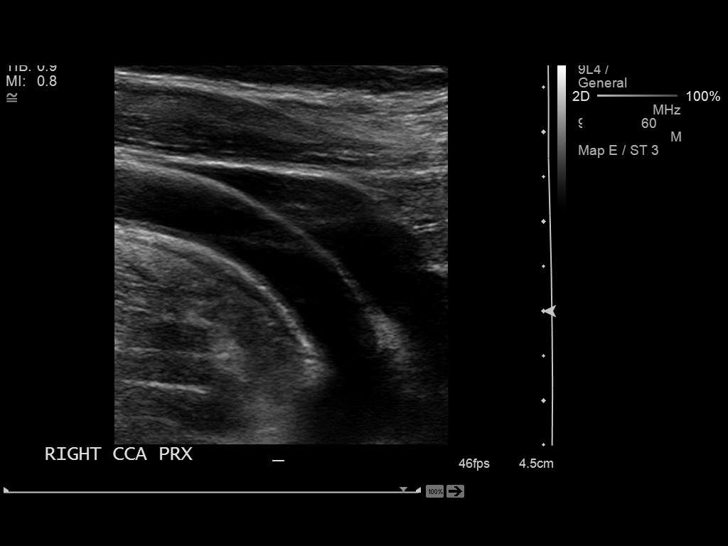
[im 7/75]
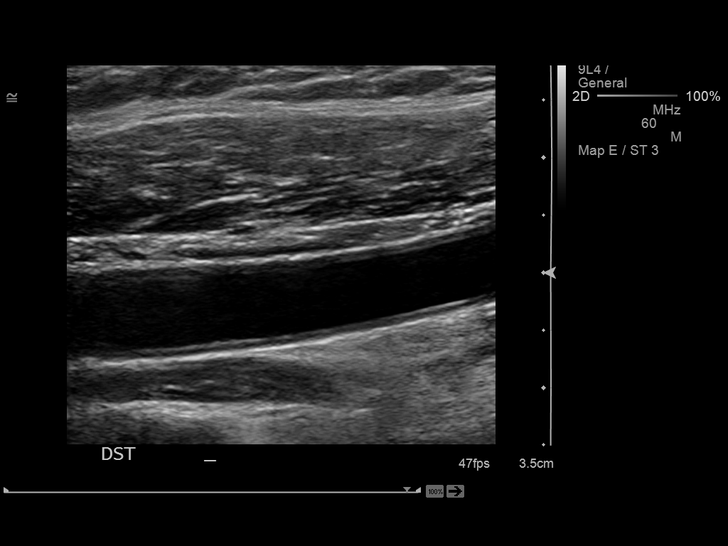
[im 13/75]
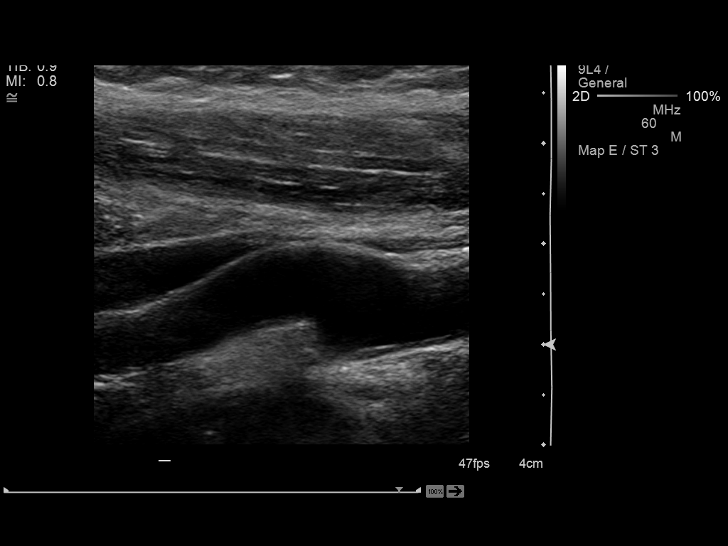
[im 20/75]
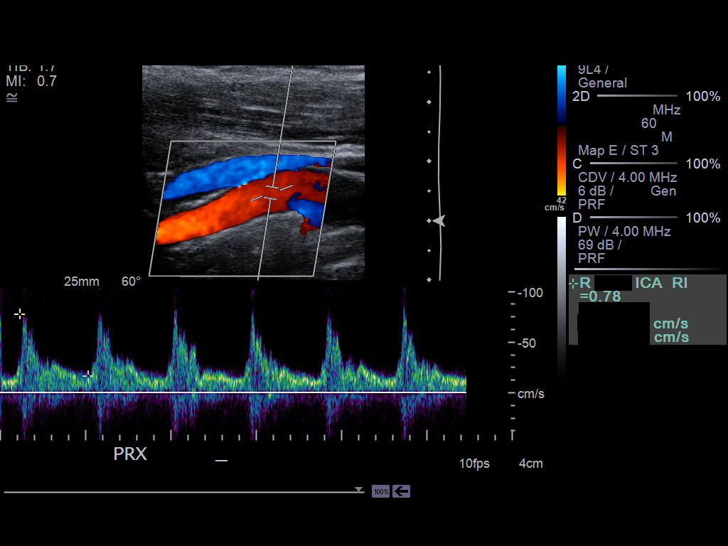
[im 26/75]
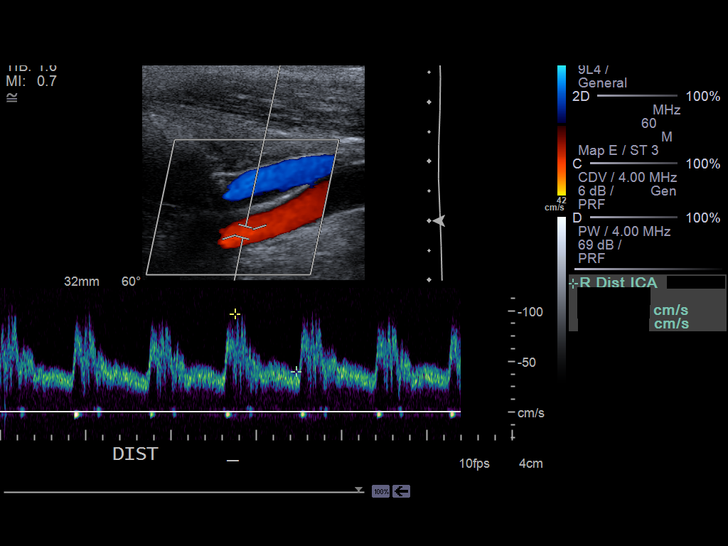
[im 33/75]
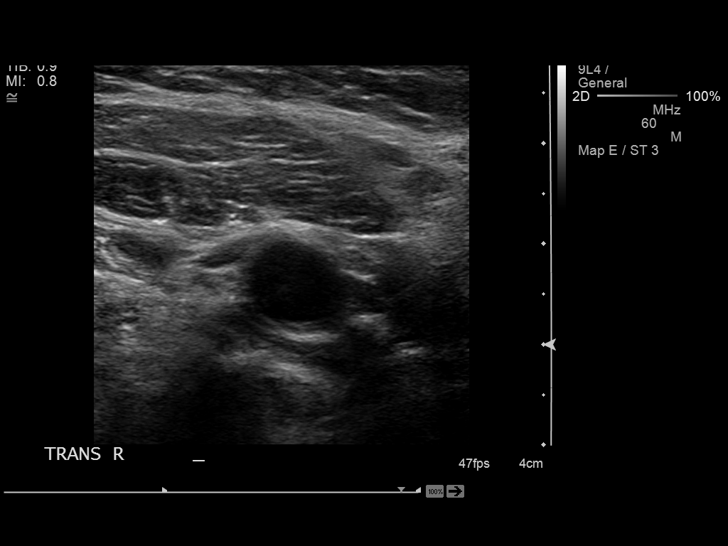
[im 39/75]
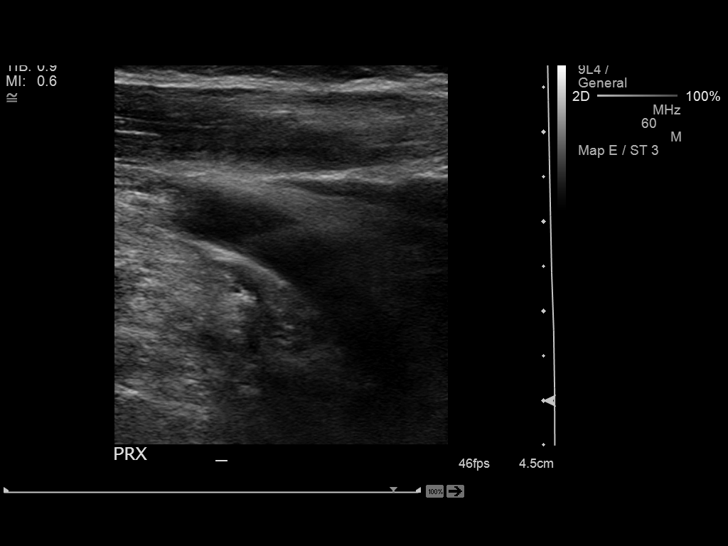
[im 42/75]
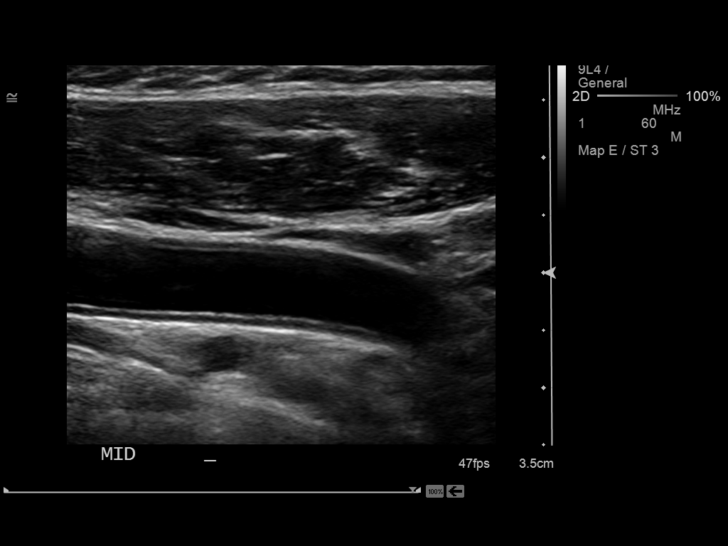
[im 49/75]
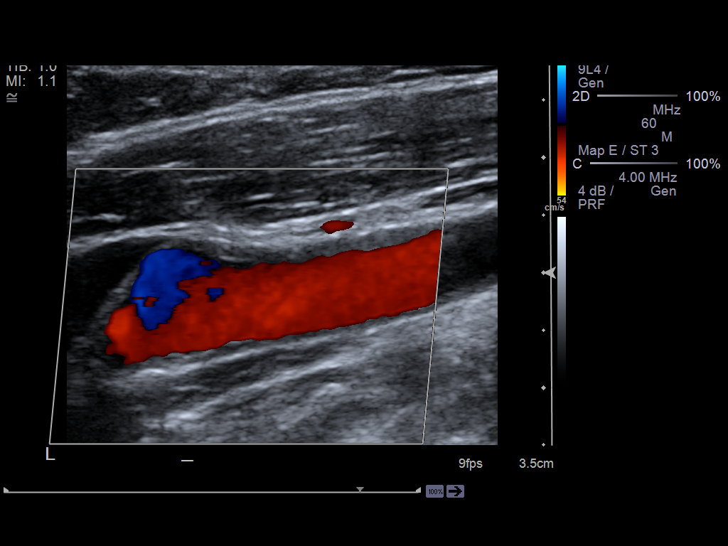
[im 55/75]
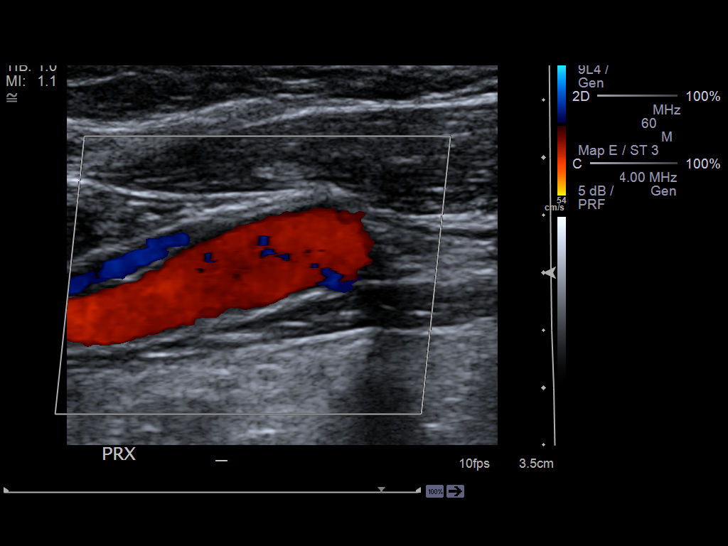
[im 62/75]
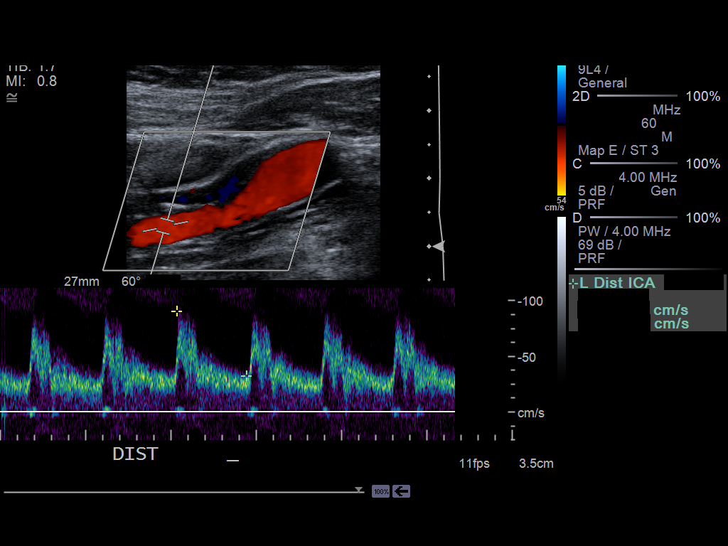
[im 68/75]
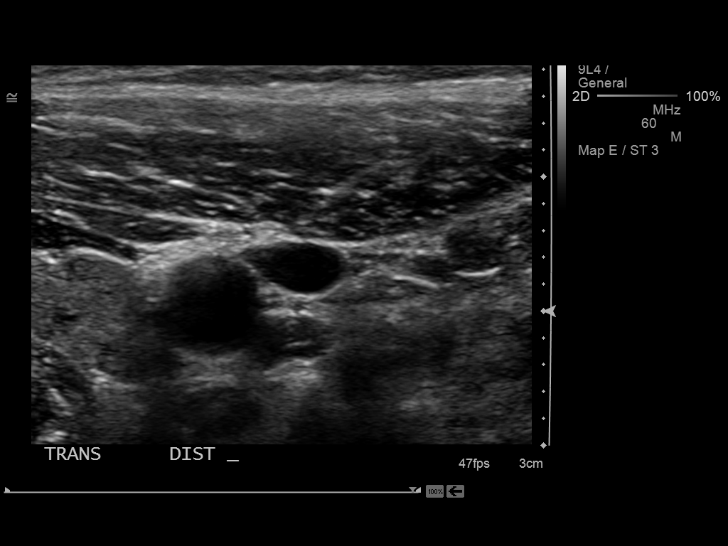
[im 75/75]
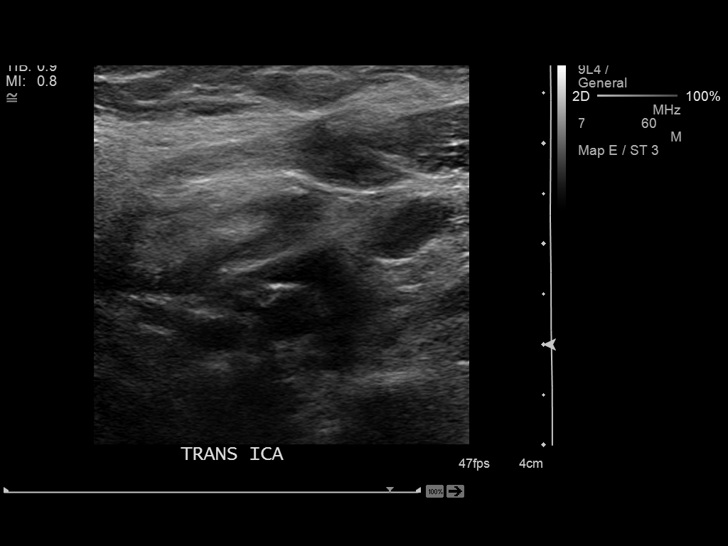

[13 of 24 positions shown; findings below may reference images not displayed]

FINDINGS: Criteria: Quantification of carotid stenosis is based on velocity
parameters that correlate the residual internal carotid diameter
with NASCET-based stenosis levels, using the diameter of the distal
internal carotid lumen as the denominator for stenosis measurement.

The following velocity measurements were obtained:

RIGHT

ICA:  101 cm/sec

CCA:  115 cm/sec

SYSTOLIC ICA/CCA RATIO:

DIASTOLIC ICA/CCA RATIO:

ECA:  86 cm/sec

LEFT

ICA:  93 cm/sec

CCA:  119 cm/sec

SYSTOLIC ICA/CCA RATIO:

DIASTOLIC ICA/CCA RATIO:

ECA:  94 cm/sec

RIGHT CAROTID ARTERY: Little if any plaque in the bulb. Low
resistance internal carotid Doppler pattern.

RIGHT VERTEBRAL ARTERY:  Antegrade.  Normal Doppler pattern.

LEFT CAROTID ARTERY: Little if any plaque in the bulb. Low
resistance internal carotid Doppler pattern.

LEFT VERTEBRAL ARTERY:  Antegrade.  Normal Doppler pattern.
IMPRESSION: Less than 50% stenosis in the right and left internal carotid
arteries.

## 2014-03-04 ENCOUNTER — Encounter: Payer: Self-pay | Admitting: General Surgery

## 2014-03-06 ENCOUNTER — Ambulatory Visit: Payer: Self-pay | Admitting: Family Medicine

## 2014-03-16 ENCOUNTER — Encounter: Payer: Self-pay | Admitting: General Surgery

## 2014-03-16 ENCOUNTER — Ambulatory Visit (INDEPENDENT_AMBULATORY_CARE_PROVIDER_SITE_OTHER): Payer: BC Managed Care – PPO | Admitting: General Surgery

## 2014-03-16 ENCOUNTER — Other Ambulatory Visit: Payer: BC Managed Care – PPO

## 2014-03-16 VITALS — BP 150/100 | HR 78 | Resp 16 | Ht 65.0 in | Wt 241.0 lb

## 2014-03-16 DIAGNOSIS — E041 Nontoxic single thyroid nodule: Secondary | ICD-10-CM

## 2014-03-16 NOTE — Patient Instructions (Signed)
The patient is aware to call back for any questions or concerns.  

## 2014-03-16 NOTE — Progress Notes (Signed)
Patient ID: Julie James, female   DOB: 1951-03-17, 63 y.o.   MRN: [id number]  Chief Complaint  Patient presents with  . Thyroid Nodule    New Patient evaluation of thyroid lesion     HPI Julie James is a 63 y.o. female here today for a thyroid lesion. Patient had a Caroid ultrasound  performed at [facility] on 03/03/14. The patient denies any problems with swallowing or any other symptoms. She states she had followed up with her Primary Care Physician after a visit to the ER for dizziness related to hypertension. This is when her Primary Care Physician felt her thyroid was enlarged.   HPI  Past Medical History  Diagnosis Date  . Hypertension     History reviewed. No pertinent past surgical history.  Family History  Problem Relation Age of Onset  . Cancer Sister     breast  . Cancer Sister     breast    Social History History  Substance Use Topics  . Smoking status: Never Smoker   . Smokeless tobacco: Never Used  . Alcohol Use: No    No Known Allergies  Current Outpatient Prescriptions  Medication Sig Dispense Refill  . CALCIUM-MAGNESIUM-ZINC PO Take 3 tablets by mouth daily.      . cyanocobalamin 100 MCG tablet Take 1,000 mcg by mouth daily.      . hydrochlorothiazide (HYDRODIURIL) 25 MG tablet Take 25 mg by mouth daily.      . Multiple Vitamin (MULTIVITAMIN) tablet Take 2 tablets by mouth daily.       No current facility-administered medications for this visit.    Review of Systems Review of Systems  Constitutional: Negative.   Respiratory: Negative.   Cardiovascular: Negative.     Blood pressure 150/100, pulse 78, resp. rate 16, height 5\' 5"  (1.651 m), weight 241 lb (109.317 kg).  Physical Exam Physical Exam  Constitutional: She is oriented to person, place, and time. She appears well-developed and well-nourished.  Eyes: Conjunctivae are normal. No scleral icterus.  Neck: Carotid bruit is present (right side). Mass (right side. 4 - 5 cm moves with  swallowing. Nontender.) present.  Cardiovascular: Normal rate, regular rhythm and normal heart sounds.   No murmur heard. Pulmonary/Chest: Effort normal and breath sounds normal.  Neurological: She is alert and oriented to person, place, and time.  Skin: Skin is warm and dry.    Data Reviewed  Ultrasound reviewed-large riht nodule , 2 small ones on left. Assessment    On [place] here today no defined nodules sen on left but focal calcification noted lower pole left lobe. Right lobe is more or less fully replaced by a large homogeneous nodule. FNA was completed today.     Plan    Await cytology report. Findings fully discussed with pt.        Seeplaputhur Julie James 03/16/2014, 11:23 AM

## 2014-03-17 LAB — FINE-NEEDLE ASPIRATION

## 2014-04-30 ENCOUNTER — Ambulatory Visit: Payer: BC Managed Care – PPO | Admitting: General Surgery

## 2014-05-14 ENCOUNTER — Encounter: Payer: Self-pay | Admitting: *Deleted

## 2014-08-20 ENCOUNTER — Ambulatory Visit: Payer: Self-pay | Admitting: Physician Assistant

## 2014-10-05 ENCOUNTER — Encounter: Payer: Self-pay | Admitting: General Surgery

## 2015-05-06 DIAGNOSIS — Z1211 Encounter for screening for malignant neoplasm of colon: Secondary | ICD-10-CM

## 2015-05-06 NOTE — Telephone Encounter (Signed)
This encounter was created in error - please disregard.

## 2015-05-13 ENCOUNTER — Ambulatory Visit: Payer: Self-pay | Admitting: General Surgery

## 2015-07-06 ENCOUNTER — Encounter: Payer: Self-pay | Admitting: *Deleted

## 2016-06-19 ENCOUNTER — Telehealth: Payer: Self-pay | Admitting: Family Medicine

## 2016-06-19 NOTE — Telephone Encounter (Signed)
Patient must been seen before any refills.Stites

## 2016-06-19 NOTE — Telephone Encounter (Signed)
Pt needs a refills on hydrochlorothiazide and BP medication sent to [facility].  Her call back number is [phone]

## 2016-06-23 ENCOUNTER — Ambulatory Visit (INDEPENDENT_AMBULATORY_CARE_PROVIDER_SITE_OTHER): Payer: Self-pay | Admitting: Family Medicine

## 2016-06-23 ENCOUNTER — Encounter: Payer: Self-pay | Admitting: Family Medicine

## 2016-06-23 VITALS — BP 136/72 | HR 56 | Temp 97.5°F | Resp 16 | Ht 65.0 in | Wt 242.6 lb

## 2016-06-23 DIAGNOSIS — E785 Hyperlipidemia, unspecified: Secondary | ICD-10-CM

## 2016-06-23 DIAGNOSIS — E041 Nontoxic single thyroid nodule: Secondary | ICD-10-CM

## 2016-06-23 DIAGNOSIS — I10 Essential (primary) hypertension: Secondary | ICD-10-CM

## 2016-06-23 LAB — COMPLETE METABOLIC PANEL WITH GFR
ALT: 13 U/L (ref 6–29)
AST: 18 U/L (ref 10–35)
Albumin: 4 g/dL (ref 3.6–5.1)
Alkaline Phosphatase: 58 U/L (ref 33–130)
BUN: 18 mg/dL (ref 7–25)
CALCIUM: 9.2 mg/dL (ref 8.6–10.4)
CHLORIDE: 104 mmol/L (ref 98–110)
CO2: 27 mmol/L (ref 20–31)
CREATININE: 0.8 mg/dL (ref 0.50–0.99)
GFR, Est Non African American: 79 mL/min (ref >=60)
Glucose, Bld: 89 mg/dL (ref 65–99)
POTASSIUM: 3.9 mmol/L (ref 3.5–5.3)
Sodium: 142 mmol/L (ref 135–146)
Total Bilirubin: 0.4 mg/dL (ref 0.2–1.2)
Total Protein: 7.2 g/dL (ref 6.1–8.1)

## 2016-06-23 LAB — TSH: TSH: 2.21 m[IU]/L

## 2016-06-23 MED ORDER — LOVASTATIN 40 MG PO TABS: 40.0000 mg | ORAL_TABLET | Freq: Every day | ORAL | Status: DC

## 2016-06-23 MED ORDER — HYDROCHLOROTHIAZIDE 25 MG PO TABS: 12.5000 mg | ORAL_TABLET | Freq: Every day | ORAL | Status: DC

## 2016-06-23 NOTE — Progress Notes (Signed)
Subjective:    Patient ID: Julie James, female    DOB: [birth year], 65 y.o.   MRN: [id number]  HPI: Julie James is a 65 y.o. female presenting on 06/23/2016 for Hypertension   HPI  Pt presents for follow-up of hypertension. Doing well at home. No dizziness. No chest pain. No HA. No visual changes.  H/o thyroid nodule and incomplete biopsy in 2015. Pt has not followed with Dr. Jamal Collin since that time. Pt also had an abnormal TSH at the time. No difficulty swallowing. No trouble breathing.   Has not has colonoscopy. Is not insured right now. Defer for insurance.   Past Medical History  Diagnosis Date  . Hypertension     Current Outpatient Prescriptions on File Prior to Visit  Medication Sig  . CALCIUM-MAGNESIUM-ZINC PO Take 3 tablets by mouth daily.  . cyanocobalamin 100 MCG tablet Take 1,000 mcg by mouth daily.  . Multiple Vitamin (MULTIVITAMIN) tablet Take 2 tablets by mouth daily.   No current facility-administered medications on file prior to visit.    Review of Systems  Constitutional: Negative for fever and chills.  HENT: Negative.   Respiratory: Negative for cough, chest tightness and wheezing.   Cardiovascular: Negative for chest pain and leg swelling.  Gastrointestinal: Negative for nausea, vomiting, abdominal pain, diarrhea and constipation.  Endocrine: Negative.  Negative for cold intolerance, heat intolerance, polydipsia, polyphagia and polyuria.  Genitourinary: Negative for dysuria and difficulty urinating.  Musculoskeletal: Negative.   Neurological: Negative for dizziness, light-headedness and numbness.  Psychiatric/Behavioral: Negative.    Per HPI unless specifically indicated above     Objective:    BP 136/72 mmHg  Pulse 56  Temp(Src) 97.5 F (36.4 C) (Oral)  Resp 16  Ht 5\' 5"  (1.651 m)  Wt 242 lb 9.6 oz (110.043 kg)  BMI 40.37 kg/m2  Wt Readings from Last 3 Encounters:  06/23/16 242 lb 9.6 oz (110.043 kg)  03/16/14 241 lb (109.317 kg)     Physical Exam  Constitutional: She is oriented to person, place, and time. She appears well-developed and well-nourished.  HENT:  Head: Normocephalic and atraumatic.  Neck: Neck supple. No edema, no erythema and normal range of motion present. Thyromegaly present.  Cardiovascular: Normal rate, regular rhythm and normal heart sounds.  Exam reveals no gallop and no friction rub.   No murmur heard. Pulmonary/Chest: Effort normal and breath sounds normal. She has no wheezes. She exhibits no tenderness.  Abdominal: Soft. Normal appearance and bowel sounds are normal. She exhibits no distension and no mass. There is no tenderness. There is no rebound and no guarding.  Musculoskeletal: Normal range of motion. She exhibits no edema or tenderness.  Lymphadenopathy:    She has no cervical adenopathy.  Neurological: She is alert and oriented to person, place, and time.  Skin: Skin is warm and dry.   Results for orders placed or performed in visit on 03/16/14  Fine-Needle Aspiration  Result Value Ref Range   Source: Comment    PATH REPORT.FINAL DX SPEC Comment    DIAGNOSIS: Comment    Signed out by: Comment    Performed by: Comment       Assessment & Plan:   Problem List Items Addressed This Visit      Cardiovascular and Mediastinum   Hypertension    Controlled. Will Try lower dose of HCTZ. Pt will monitor BP closely at home. Recheck 2 mos.  Check CMP today.       Relevant Medications   hydrochlorothiazide (HYDRODIURIL)  25 MG tablet   lovastatin (MEVACOR) 40 MG tablet   Other Relevant Orders   COMPLETE METABOLIC PANEL WITH GFR     Endocrine   Thyroid nodule - Primary    Pt encouraged to follow-up with surgeon. Will check TSH today. Consider thyroid US for follow-up. Reviewed alarm symptoms.       Relevant Orders   TSH     Other   Hyperlipidemia    Check CMP to determine liver function. Continue lovastatin.       Relevant Medications   hydrochlorothiazide (HYDRODIURIL)  25 MG tablet   lovastatin (MEVACOR) 40 MG tablet      Meds ordered this encounter  Medications  . DISCONTD: lovastatin (MEVACOR) 40 MG tablet    Sig: Take 40 mg by mouth at bedtime.  . hydrochlorothiazide (HYDRODIURIL) 25 MG tablet    Sig: Take 0.5 tablets (12.5 mg total) by mouth daily.    Dispense:  30 tablet    Refill:  11    Order Specific Question:  Supervising Provider    Answer:  Arlis Porta [phone]  . lovastatin (MEVACOR) 40 MG tablet    Sig: Take 1 tablet (40 mg total) by mouth at bedtime.    Dispense:  30 tablet    Refill:  11    Order Specific Question:  Supervising Provider    Answer:  Arlis Porta [phone]      Follow up plan: Return in about 2 months (around 08/24/2016) for Thyroid issues. Marland Kitchen

## 2016-06-23 NOTE — Assessment & Plan Note (Signed)
Pt encouraged to follow-up with surgeon. Will check TSH today. Consider thyroid US for follow-up. Reviewed alarm symptoms.

## 2016-06-23 NOTE — Patient Instructions (Signed)
Your blood pressure is very well controlled without medication today. Let's try a lower dose of the Hydrodiuril. Take 1/2 tablet once daily. Check your blood pressure.  Your goal blood pressure is 140/90 or less. If you BP is > 140/90 on consistent basis, please take a whole tablet of your BP medication.  Work on low salt/sodium diet - goal <1.5gm (1,500mg ) per day. Eat a diet high in fruits/vegetables and whole grains.  Look into mediterranean and DASH diet. Goal activity is 157min/wk of moderate intensity exercise.  This can be split into 30 minute chunks.  If you are not at this level, you can start with smaller 10-15 min increments and slowly build up activity. Look at [email or web address] for more resources  Please seek immediate medical attention at ER or Urgent Care if you develop: Chest pain, pressure or tightness. Shortness of breath accompanied by nausea or diaphoresis Visual changes Numbness or tingling on one side of the body Facial droop Altered mental status Or any concerning symptoms.

## 2016-06-23 NOTE — Assessment & Plan Note (Signed)
Check CMP to determine liver function. Continue lovastatin.

## 2016-06-23 NOTE — Assessment & Plan Note (Signed)
Controlled. Will Try lower dose of HCTZ. Pt will monitor BP closely at home. Recheck 2 mos.  Check CMP today.

## 2016-10-30 ENCOUNTER — Ambulatory Visit (INDEPENDENT_AMBULATORY_CARE_PROVIDER_SITE_OTHER): Payer: Self-pay | Admitting: Family Medicine

## 2016-10-30 ENCOUNTER — Encounter: Payer: Self-pay | Admitting: Family Medicine

## 2016-10-30 VITALS — BP 150/74 | HR 90 | Temp 98.2°F | Resp 16 | Ht 65.0 in | Wt 241.0 lb

## 2016-10-30 DIAGNOSIS — J209 Acute bronchitis, unspecified: Secondary | ICD-10-CM

## 2016-10-30 MED ORDER — AZITHROMYCIN 250 MG PO TABS: ORAL_TABLET | ORAL | 0 refills | Status: DC

## 2016-10-30 NOTE — Patient Instructions (Signed)
Thank you for coming in to clinic today.  1. It sounds like you had an Upper Respiratory Virus that has settled into a Bronchitis, lower respiratory tract infection. I don't have concerns for pneumonia today, and think that this should gradually improve. Once you are feeling better, the cough may take a few weeks to fully resolve. - Start Azithromycin Z pak (antibiotic) 2 tabs day 1, then 1 tab x 4 days, complete entire course even if improved - Start OTC Mucinex-DM for 7-10 days then stop, to help clear the mucus - Start OTC Loratadine (Claritin) 10mg  daily for 4 weeks to reduce congestion - Drink plenty of fluids to improve congestion - You may try over the counter Nasal Saline spray (Simply Saline, Ocean Spray) as needed to reduce congestion. - May take Tylenol / Motrin  Please schedule a follow-up appointment with Dr. Parks Ranger in 2 weeks as needed for bronchitis if worsening  If you have any other questions or concerns, please feel free to call the clinic or send a message through [facility]. You may also schedule an earlier appointment if necessary.  Nobie Putnam, DO [facility]

## 2016-10-30 NOTE — Progress Notes (Signed)
Subjective:    Patient ID: Julie James, female    DOB: 1951-10-26, 65 y.o.   MRN: [id number]  Julie James is a 65 y.o. female presenting on 10/30/2016 for Cough (onset 2 weeks dry cough)  Patient presents for a same day appointment.  HPI   URI / BRONCHITIS: - Reports persistent cough for past 2-3 weeks following initial URI symptoms with nasal congestion also had some concerns with "wheezing" or upper airway congestion. Initially improved and then worsening again over past few days with some productive cough. - Tried OTC cold decongestant and anti-histamine without much relief for a few days - No second hand smoke exposure - H/o prior taking allergy medicine on occasion, but not regularly - Recent sick contact with grand daughter 6 yr with ear infection and pink eye - Denies fevers/chills, sweats, nausea, vomiting, abdominal pain, chest pain, dyspnea, headache    Social History  Substance Use Topics  . Smoking status: Never Smoker  . Smokeless tobacco: Never Used  . Alcohol use No    Review of Systems Per HPI unless specifically indicated above     Objective:    BP (!) 150/74 (BP Location: Right Arm, Cuff Size: Normal)   Pulse 90   Temp 98.2 F (36.8 C) (Oral)   Resp 16   Ht 5\' 5"  (1.651 m)   Wt 241 lb (109.3 kg)   SpO2 100%   BMI 40.10 kg/m   Wt Readings from Last 3 Encounters:  10/30/16 241 lb (109.3 kg)  06/23/16 242 lb 9.6 oz (110 kg)  03/16/14 241 lb (109.3 kg)    Physical Exam  Constitutional: She appears well-developed and well-nourished. No distress.  Well-appearing, comfortable, cooperative  HENT:  Head: Normocephalic and atraumatic.  Frontal / maxillary sinuses non-tender. Nares patent with some deeper congestion but without purulence or edema. Bilateral TMs clear without erythema, effusion or bulging. Oropharynx clear without erythema, exudates, edema or asymmetry.  Eyes: Conjunctivae are normal. Right eye exhibits no discharge. Left eye  exhibits no discharge.  Neck: Normal range of motion. Neck supple.  Cardiovascular: Normal rate, regular rhythm, normal heart sounds and intact distal pulses.   No murmur heard. Pulmonary/Chest: Effort normal and breath sounds normal. No respiratory distress. She has no wheezes. She has no rales.  Frequent coughing spells, mildly productive. Good air movement.  Musculoskeletal: She exhibits no edema.  Neurological: She is alert.  Skin: Skin is warm and dry. She is not diaphoretic.  Nursing note and vitals reviewed.   Results for orders placed or performed in visit on 06/23/16  COMPLETE METABOLIC PANEL WITH GFR  Result Value Ref Range   Sodium 142 135 - 146 mmol/L   Potassium 3.9 3.5 - 5.3 mmol/L   Chloride 104 98 - 110 mmol/L   CO2 27 20 - 31 mmol/L   Glucose, Bld 89 65 - 99 mg/dL   BUN 18 7 - 25 mg/dL   Creat 0.80 0.50 - 0.99 mg/dL   Total Bilirubin 0.4 0.2 - 1.2 mg/dL   Alkaline Phosphatase 58 33 - 130 U/L   AST 18 10 - 35 U/L   ALT 13 6 - 29 U/L   Total Protein 7.2 6.1 - 8.1 g/dL   Albumin 4.0 3.6 - 5.1 g/dL   Calcium 9.2 8.6 - 10.4 mg/dL   GFR, Est African American >89 >=60 mL/min   GFR, Est Non African American 79 >=60 mL/min  TSH  Result Value Ref Range   TSH 2.21 mIU/L  Assessment & Plan:   Problem List Items Addressed This Visit    None    Visit Diagnoses    Acute bronchitis, unspecified organism    -  Primary  Consistent with worsening bronchitis in setting of likely viral URI (+sick contacts). Concern with duration >3 week - Afebrile, no focal signs of infection (not consistent with pneumonia by history or exam), no evidence sinusitis  Plan: 1. Start Azithromycin Z-pak dosing 500mg  then 250mg  daily x 4 days 2. Start Mucinex-DM 7-10 days then stop, stop other OTC decongestant 3. Start OTC Loratadine 10mg  daily for 4 weeks 4. Return criteria reviewed, follow-up within 1-2 week if not improved     Relevant Medications   azithromycin (ZITHROMAX Z-PAK)  250 MG tablet      Meds ordered this encounter  Medications  . azithromycin (ZITHROMAX Z-PAK) 250 MG tablet    Sig: Take 2 tabs (500mg  total) on Day 1. Take 1 tab (250mg ) daily for next 4 days.    Dispense:  6 tablet    Refill:  0      Follow up plan: Return in about 2 weeks (around 11/13/2016), or if symptoms worsen or fail to improve, for bronchitis.  Nobie Putnam, DO [facility] 10/30/2016, 9:30 AM

## 2017-07-30 ENCOUNTER — Encounter: Payer: Self-pay | Admitting: Nurse Practitioner

## 2017-07-30 ENCOUNTER — Ambulatory Visit (INDEPENDENT_AMBULATORY_CARE_PROVIDER_SITE_OTHER): Payer: Self-pay | Admitting: Nurse Practitioner

## 2017-07-30 VITALS — BP 150/76 | HR 71 | Temp 98.6°F | Ht 65.0 in | Wt 252.6 lb

## 2017-07-30 DIAGNOSIS — E041 Nontoxic single thyroid nodule: Secondary | ICD-10-CM

## 2017-07-30 DIAGNOSIS — I10 Essential (primary) hypertension: Secondary | ICD-10-CM

## 2017-07-30 DIAGNOSIS — D171 Benign lipomatous neoplasm of skin and subcutaneous tissue of trunk: Secondary | ICD-10-CM

## 2017-07-30 DIAGNOSIS — E782 Mixed hyperlipidemia: Secondary | ICD-10-CM

## 2017-07-30 DIAGNOSIS — D179 Benign lipomatous neoplasm, unspecified: Secondary | ICD-10-CM | POA: Insufficient documentation

## 2017-07-30 DIAGNOSIS — D1723 Benign lipomatous neoplasm of skin and subcutaneous tissue of right leg: Secondary | ICD-10-CM | POA: Insufficient documentation

## 2017-07-30 MED ORDER — CHLORTHALIDONE 25 MG PO TABS: 12.5000 mg | ORAL_TABLET | Freq: Every day | ORAL | 2 refills | Status: DC

## 2017-07-30 NOTE — Progress Notes (Signed)
I have reviewed this encounter including the documentation in this note and/or discussed this patient with the provider, [name], AGPCNP-BC. I am certifying that I agree with the content of this note as supervising physician.  Nobie Putnam, [facility] 07/30/2017, 12:16 PM

## 2017-07-30 NOTE — Assessment & Plan Note (Signed)
Last lipid panel 2016.  Pt on lovastatin and tolerating well w/o side effects.  Plan: 1. Recheck lipid panel and CMP to determine liver function.  2. Continue lovastatin.  3. Follow up 1 year if stable, med change and recheck 6 mos if worsening.

## 2017-07-30 NOTE — Progress Notes (Signed)
Subjective:    Patient ID: Julie James, female    DOB: 1951-09-11, 66 y.o.   MRN: [id number]  Julie James is a 66 y.o. female presenting on 07/30/2017 for Hypertension (need refill on medication)   HPI Hypertension - She is not checking BP at home or outside of clinic.    - Current medications: hydrochlorothiazide 25 mg once daily (despite last Rx for 1/2 tablet), tolerating well without side effects - Pt denies headache, lightheadedness, dizziness, changes in vision, chest tightness/pressure, palpitations, leg swelling, sudden loss of speech or loss of consciousness. - She  reports no regular exercise routine. - Her diet is high in salt, moderate in fat, and moderate in carbohydrates. - Weight increased 10 lbs in 1 year.  Previously stable.   Thyroid Nodule  Has not had any follow up since 2015.  Pt states she didn't want to have follow up because didn't feel it was necessary (beliefs about illness).  No notice of changes to size.  Occasionally feels like food goes down the wrong way, but otherwise has no compressive symptoms. - She denies, fatigue, excess energy, weight changes, heart racing, heart palpitations, heat and cold intolerance, changes in hair/skin/nails, and lower leg swelling.  - She does not have any compressive symptoms to include difficulty swallowing, globus sensation, or difficulty breathing when lying flat.   Lipoma Pt has previously been diagnosed w/ "fatty tumors" (L side stomach and inner right thigh).  Bothersome when laying on side.  On leg, now starting to protrude w/ increased visibility.     Social History  Substance Use Topics  . Smoking status: Never Smoker  . Smokeless tobacco: Never Used  . Alcohol use No    Review of Systems Per HPI unless specifically indicated above     Objective:    BP (!) 150/76 (BP Location: Right Arm, Patient Position: Sitting, Cuff Size: Large)   Pulse 71   Temp 98.6 F (37 C) (Oral)   Ht 5\' 5"  (1.651  m)   Wt 252 lb 9.6 oz (114.6 kg)   BMI 42.03 kg/m   Wt Readings from Last 3 Encounters:  07/30/17 252 lb 9.6 oz (114.6 kg)  10/30/16 241 lb (109.3 kg)  06/23/16 242 lb 9.6 oz (110 kg)    Physical Exam  Constitutional: She is oriented to person, place, and time. She appears well-developed and well-nourished. No distress.  HENT:  Head: Normocephalic and atraumatic.  Mouth/Throat: Oropharynx is clear and moist.  Neck: Normal range of motion. Neck supple. No thyromegaly present.  Cardiovascular: Normal rate, regular rhythm and normal heart sounds.   Abdominal: Soft. Bowel sounds are normal.    Neurological: She is alert and oriented to person, place, and time.  Skin: Skin is warm and dry.     Psychiatric: She has a normal mood and affect. Her behavior is normal. Judgment and thought content normal.  Vitals reviewed.  Results for orders placed or performed in visit on 06/23/16  COMPLETE METABOLIC PANEL WITH GFR  Result Value Ref Range   Sodium 142 135 - 146 mmol/L   Potassium 3.9 3.5 - 5.3 mmol/L   Chloride 104 98 - 110 mmol/L   CO2 27 20 - 31 mmol/L   Glucose, Bld 89 65 - 99 mg/dL   BUN 18 7 - 25 mg/dL   Creat 0.80 0.50 - 0.99 mg/dL   Total Bilirubin 0.4 0.2 - 1.2 mg/dL   Alkaline Phosphatase 58 33 - 130 U/L   AST 18 10 -  35 U/L   ALT 13 6 - 29 U/L   Total Protein 7.2 6.1 - 8.1 g/dL   Albumin 4.0 3.6 - 5.1 g/dL   Calcium 9.2 8.6 - 10.4 mg/dL   GFR, Est African American >89 >=60 mL/min   GFR, Est Non African American 79 >=60 mL/min  TSH  Result Value Ref Range   TSH 2.21 mIU/L      Assessment & Plan:   Problem List Items Addressed This Visit      Cardiovascular and Mediastinum   Hypertension - Primary    Uncontrolled today.  Two SBP readings in clinic > 150 .  Pt was taking HCTZ 25 mg once daily despite Rx for 1/2 tablet 12.5 mg once daily.  Plan: 1. Discussed medication options - pt prefers to stay on one agent and has no side effects of current med. 2.  Reviewed BP goals,  pt will monitor BP closely at home. 3. BP check in 2 weeks in clinic.  Must bring BP log on new medication prior to refill.  5. Follow up 3 months.  Pt declines regular office visit - would rather her continue med for BP control than be uncontrolled so consider med refill up to 1 year if BP checks at goal.       Relevant Medications   chlorthalidone (HYGROTON) 25 MG tablet   Other Relevant Orders   Comprehensive metabolic panel     Endocrine   Thyroid nodule    Pt notes is stable, but has not had repeat thyroid US since initial in 2015.  Pt does not recall results of biopsy, but states Dr. Jamal Collin did review w/ her that no further testing was needed other than repeat US.  Plan: 1. Pt encouraged to obtain US evaluate changes in nodule if any.  If changes or suggestion of biopsy, will refer to endocrinology. 2. Defer TSH today as was normal in 2016. 3. Follow up 1 year or sooner if changes.       Relevant Orders   US Thyroid Biopsy     Other   Hyperlipidemia    Last lipid panel 2016.  Pt on lovastatin and tolerating well w/o side effects.  Plan: 1. Recheck lipid panel and CMP to determine liver function.  2. Continue lovastatin.  3. Follow up 1 year if stable, med change and recheck 6 mos if worsening.      Relevant Medications   chlorthalidone (HYGROTON) 25 MG tablet   Other Relevant Orders   Lipid panel   Comprehensive metabolic panel   Lipoma of abdomen    8 in x 5 in in size.  No prior measurement.  Becoming more noticeable to pt.  Plan: 1. Reviewed benign nature of these lipomas.   2. If bothersome can have general surgery remove.  Would recommend [place] to verify type of mass if not proceeding w/ referral. 3. Follow up as needed and in 1 year.      Lipoma of right thigh    3.5 in diameter - See AP for lipoma abdomen.         Meds ordered this encounter  Medications  . chlorthalidone (HYGROTON) 25 MG tablet    Sig: Take 0.5 tablets (12.5 mg  total) by mouth daily.    Dispense:  15 tablet    Refill:  2    Order Specific Question:   Supervising Provider    Answer:   Julie James [[id number]]      Follow up  plan: Return in about 3 months (around 10/30/2017) for Blood Pressure.  [name], DNP, AGPCNP-BC Adult Gerontology Primary Care Nurse Practitioner [facility] 07/30/2017, 8:43 AM

## 2017-07-30 NOTE — Assessment & Plan Note (Signed)
3.5 in diameter - See AP for lipoma abdomen.

## 2017-07-30 NOTE — Assessment & Plan Note (Signed)
8 in x 5 in in size.  No prior measurement.  Becoming more noticeable to pt.  Plan: 1. Reviewed benign nature of these lipomas.   2. If bothersome can have general surgery remove.  Would recommend [place] to verify type of mass if not proceeding w/ referral. 3. Follow up as needed and in 1 year.

## 2017-07-30 NOTE — Patient Instructions (Addendum)
Julie James, Thank you for coming in to clinic today.  1. For your blood pressure: - GOAL is less than 130/80 and higher than 110/70 - STOP hydrochlorothiazide. - START chlorthalidone 25 mg tablet.  TAKE 1/2 tablet once daily. - Bring a log of your readings to clinic in about 2-4 weeks.   - Make an appointment for BP check in clinic in about 2 weeks. - In encourage you to eat a low salt diet.  Read DASH eating plan below.  2. For your thyroid: - I have ordered a thyroid ultrasound.  [facility] will call to schedule.  Ask how much your cost will be. - If too much, we can get you connected w/ [facility].   3. For your fatty tumors: - Benign - not going to cause you harm.   - General Surgery - I will send a referral for removal if you want.   Please schedule a follow-up appointment with Julie James, AGNP. Return in about 3 months (around 10/30/2017) for Blood Pressure.  If you have any other questions or concerns, please feel free to call the clinic or send a message through [place]. You may also schedule an earlier appointment if necessary.  You will receive a survey after today's visit either digitally by e-mail or paper by [employer]. Your experiences and feedback matter to [place].  Please respond so we know how we are doing as we provide care for you.   Julie Smiles, DNP, AGNP-BC Adult Gerontology Nurse Practitioner [facility], [facility]    DASH Eating Plan DASH stands for "Dietary Approaches to Stop Hypertension." The DASH eating plan is a healthy eating plan that has been shown to reduce high blood pressure (hypertension). It may also reduce your risk for type 2 diabetes, heart disease, and stroke. The DASH eating plan may also help with weight loss. What are tips for following this plan? General guidelines  Avoid eating more than 2,300 mg (milligrams) of salt (sodium) a day. If you have hypertension, you may need to reduce your sodium  intake to 1,500 mg a day.  Limit alcohol intake to no more than 1 drink a day for nonpregnant women and 2 drinks a day for men. One drink equals 12 oz of beer, 5 oz of wine, or 1 oz of hard liquor.  Work with your health care provider to maintain a healthy body weight or to lose weight. Ask what an ideal weight is for you.  Get at least 30 minutes of exercise that causes your heart to beat faster (aerobic exercise) most days of the week. Activities may include walking, swimming, or biking.  Work with your health care provider or diet and nutrition specialist (dietitian) to adjust your eating plan to your individual calorie needs. Reading food labels  Check food labels for the amount of sodium per serving. Choose foods with less than 5 percent of the Daily Value of sodium. Generally, foods with less than 300 mg of sodium per serving fit into this eating plan.  To find whole grains, look for the word "whole" as the first word in the ingredient list. Shopping  Buy products labeled as "low-sodium" or "no salt added."  Buy fresh foods. Avoid canned foods and premade or frozen meals. Cooking  Avoid adding salt when cooking. Use salt-free seasonings or herbs instead of table salt or sea salt. Check with your health care provider or pharmacist before using salt substitutes.  Do not fry foods.  Cook foods using healthy methods such as baking, boiling, grilling, and broiling instead.  Cook with heart-healthy oils, such as olive, canola, soybean, or sunflower oil. Meal planning   Eat a balanced diet that includes: ? 5 or more servings of fruits and vegetables each day. At each meal, try to fill half of your plate with fruits and vegetables. ? Up to 6-8 servings of whole grains each day. ? Less than 6 oz of lean meat, poultry, or fish each day. A 3-oz serving of meat is about the same size as a deck of cards. One egg equals 1 oz. ? 2 servings of low-fat dairy each day. ? A serving of nuts,  seeds, or beans 5 times each week. ? Heart-healthy fats. Healthy fats called Omega-3 fatty acids are found in foods such as flaxseeds and coldwater fish, like sardines, salmon, and mackerel.  Limit how much you eat of the following: ? Canned or prepackaged foods. ? Food that is high in trans fat, such as fried foods. ? Food that is high in saturated fat, such as fatty meat. ? Sweets, desserts, sugary drinks, and other foods with added sugar. ? Full-fat dairy products.  Do not salt foods before eating.  Try to eat at least 2 vegetarian meals each week.  Eat more home-cooked food and less restaurant, buffet, and fast food.  When eating at a restaurant, ask that your food be prepared with less salt or no salt, if possible. What foods are recommended? The items listed may not be a complete list. Talk with your dietitian about what dietary choices are best for you. Grains Whole-grain or whole-wheat bread. Whole-grain or whole-wheat pasta. Brown rice. [place]. Bulgur. Whole-grain and low-sodium cereals. Pita bread. Low-fat, low-sodium crackers. Whole-wheat flour tortillas. Vegetables Fresh or frozen vegetables (raw, steamed, roasted, or grilled). Low-sodium or reduced-sodium tomato and vegetable juice. Low-sodium or reduced-sodium tomato sauce and tomato paste. Low-sodium or reduced-sodium canned vegetables. Fruits All fresh, dried, or frozen fruit. Canned fruit in natural juice (without added sugar). Meat and other protein foods Skinless chicken or [place]. Ground chicken or [place]. Pork with fat trimmed off. Fish and seafood. Egg whites. Dried beans, peas, or lentils. Unsalted nuts, nut butters, and seeds. Unsalted canned beans. Lean cuts of beef with fat trimmed off. Low-sodium, lean deli meat. Dairy Low-fat (1%) or fat-free (skim) milk. Fat-free, low-fat, or reduced-fat cheeses. Nonfat, low-sodium ricotta or cottage cheese. Low-fat or nonfat yogurt. Low-fat, low-sodium cheese. Fats  and oils Soft margarine without trans fats. Vegetable oil. Low-fat, reduced-fat, or light mayonnaise and salad dressings (reduced-sodium). Canola, safflower, olive, soybean, and sunflower oils. Avocado. Seasoning and other foods Herbs. Spices. Seasoning mixes without salt. Unsalted popcorn and pretzels. Fat-free sweets. What foods are not recommended? The items listed may not be a complete list. Talk with your dietitian about what dietary choices are best for you. Grains Baked goods made with fat, such as croissants, muffins, or some breads. Dry pasta or rice meal packs. Vegetables Creamed or fried vegetables. Vegetables in a cheese sauce. Regular canned vegetables (not low-sodium or reduced-sodium). Regular canned tomato sauce and paste (not low-sodium or reduced-sodium). Regular tomato and vegetable juice (not low-sodium or reduced-sodium). Angie Fava. Olives. Fruits Canned fruit in a light or heavy syrup. Fried fruit. Fruit in cream or butter sauce. Meat and other protein foods Fatty cuts of meat. Ribs. Fried meat. Berniece Salines. Sausage. Bologna and other processed lunch meats. Salami. Fatback. Hotdogs. Bratwurst. Salted nuts and seeds. Canned beans with added salt. Canned  or smoked fish. Whole eggs or egg yolks. Chicken or [place] with skin. Dairy Whole or 2% milk, cream, and half-and-half. Whole or full-fat cream cheese. Whole-fat or sweetened yogurt. Full-fat cheese. Nondairy creamers. Whipped toppings. Processed cheese and cheese spreads. Fats and oils Butter. Stick margarine. Lard. Shortening. Ghee. Bacon fat. Tropical oils, such as coconut, palm kernel, or palm oil. Seasoning and other foods Salted popcorn and pretzels. Onion salt, garlic salt, seasoned salt, table salt, and sea salt. Worcestershire sauce. Tartar sauce. Barbecue sauce. Teriyaki sauce. Soy sauce, including reduced-sodium. Steak sauce. Canned and packaged gravies. Fish sauce. Oyster sauce. Cocktail sauce. Horseradish that you find on  the shelf. Ketchup. Mustard. Meat flavorings and tenderizers. Bouillon cubes. Hot sauce and Tabasco sauce. Premade or packaged marinades. Premade or packaged taco seasonings. Relishes. Regular salad dressings. Where to find more information:  National Heart, Lung, and [place]: https://wilson-eaton.com/  American Heart Association: www.heart.org Summary  The DASH eating plan is a healthy eating plan that has been shown to reduce high blood pressure (hypertension). It may also reduce your risk for type 2 diabetes, heart disease, and stroke.  With the DASH eating plan, you should limit salt (sodium) intake to 2,300 mg a day. If you have hypertension, you may need to reduce your sodium intake to 1,500 mg a day.  When on the DASH eating plan, aim to eat more fresh fruits and vegetables, whole grains, lean proteins, low-fat dairy, and heart-healthy fats.  Work with your health care provider or diet and nutrition specialist (dietitian) to adjust your eating plan to your individual calorie needs. This information is not intended to replace advice given to you by your health care provider. Make sure you discuss any questions you have with your health care provider. Document Released: 11/09/2011 Document Revised: 11/13/2016 Document Reviewed: 11/13/2016 Elsevier Interactive Patient Education  2017 [employer].

## 2017-07-30 NOTE — Assessment & Plan Note (Signed)
Uncontrolled today.  Two SBP readings in clinic > 150 .  Pt was taking HCTZ 25 mg once daily despite Rx for 1/2 tablet 12.5 mg once daily.  Plan: 1. Discussed medication options - pt prefers to stay on one agent and has no side effects of current med. 2. Reviewed BP goals,  pt will monitor BP closely at home. 3. BP check in 2 weeks in clinic.  Must bring BP log on new medication prior to refill.  5. Follow up 3 months.  Pt declines regular office visit - would rather her continue med for BP control than be uncontrolled so consider med refill up to 1 year if BP checks at goal.

## 2017-07-30 NOTE — Assessment & Plan Note (Signed)
Pt notes is stable, but has not had repeat thyroid US since initial in 2015.  Pt does not recall results of biopsy, but states Dr. Jamal Collin did review w/ her that no further testing was needed other than repeat US.  Plan: 1. Pt encouraged to obtain US evaluate changes in nodule if any.  If changes or suggestion of biopsy, will refer to endocrinology. 2. Defer TSH today as was normal in 2016. 3. Follow up 1 year or sooner if changes.

## 2017-07-31 ENCOUNTER — Other Ambulatory Visit: Payer: Self-pay | Admitting: Nurse Practitioner

## 2017-07-31 DIAGNOSIS — E785 Hyperlipidemia, unspecified: Secondary | ICD-10-CM

## 2017-07-31 LAB — COMPREHENSIVE METABOLIC PANEL
ALT: 15 U/L (ref 6–29)
AST: 19 U/L (ref 10–35)
Albumin: 4.1 g/dL (ref 3.6–5.1)
Alkaline Phosphatase: 68 U/L (ref 33–130)
BUN: 13 mg/dL (ref 7–25)
CO2: 25 mmol/L (ref 20–32)
Calcium: 9.4 mg/dL (ref 8.6–10.4)
Chloride: 103 mmol/L (ref 98–110)
Creat: 0.86 mg/dL (ref 0.50–0.99)
Glucose, Bld: 99 mg/dL (ref 65–99)
Potassium: 4.2 mmol/L (ref 3.5–5.3)
Sodium: 138 mmol/L (ref 135–146)
Total Bilirubin: 0.5 mg/dL (ref 0.2–1.2)
Total Protein: 7.1 g/dL (ref 6.1–8.1)

## 2017-07-31 LAB — LIPID PANEL
Cholesterol: 180 mg/dL (ref <200)
HDL: 46 mg/dL — ABNORMAL LOW (ref >50)
LDL Cholesterol: 111 mg/dL — ABNORMAL HIGH (ref <100)
Total CHOL/HDL Ratio: 3.9 Ratio (ref <5.0)
Triglycerides: 116 mg/dL (ref <150)
VLDL: 23 mg/dL (ref <30)

## 2017-07-31 MED ORDER — LOVASTATIN 40 MG PO TABS: 40.0000 mg | ORAL_TABLET | Freq: Every day | ORAL | 11 refills | Status: DC

## 2017-08-13 ENCOUNTER — Ambulatory Visit (INDEPENDENT_AMBULATORY_CARE_PROVIDER_SITE_OTHER): Payer: Self-pay

## 2017-08-13 VITALS — BP 139/65 | HR 73 | Ht 65.0 in | Wt 246.8 lb

## 2017-08-13 DIAGNOSIS — I10 Essential (primary) hypertension: Secondary | ICD-10-CM

## 2017-08-13 NOTE — Progress Notes (Signed)
Pt came in today for a 2 week bp check following up on  New bp medication.

## 2017-10-22 ENCOUNTER — Telehealth: Payer: Self-pay | Admitting: Nurse Practitioner

## 2017-10-22 DIAGNOSIS — I10 Essential (primary) hypertension: Secondary | ICD-10-CM

## 2017-10-22 MED ORDER — CHLORTHALIDONE 25 MG PO TABS: 12.5000 mg | ORAL_TABLET | Freq: Every day | ORAL | 2 refills | Status: DC

## 2017-10-22 NOTE — Telephone Encounter (Signed)
Pt needs a refill on chlorthalidone sent to [facility].  She has a follow up appt scheduled for 12/5.  Her number is [phone]

## 2017-11-07 ENCOUNTER — Ambulatory Visit: Payer: Self-pay | Admitting: Nurse Practitioner

## 2018-02-08 ENCOUNTER — Encounter: Payer: Self-pay | Admitting: Nurse Practitioner

## 2018-02-08 ENCOUNTER — Other Ambulatory Visit: Payer: Self-pay

## 2018-02-08 ENCOUNTER — Ambulatory Visit (INDEPENDENT_AMBULATORY_CARE_PROVIDER_SITE_OTHER): Payer: Self-pay | Admitting: Nurse Practitioner

## 2018-02-08 DIAGNOSIS — E785 Hyperlipidemia, unspecified: Secondary | ICD-10-CM

## 2018-02-08 DIAGNOSIS — I10 Essential (primary) hypertension: Secondary | ICD-10-CM

## 2018-02-08 MED ORDER — CHLORTHALIDONE 25 MG PO TABS: 25.0000 mg | ORAL_TABLET | Freq: Every day | ORAL | 1 refills | Status: DC

## 2018-02-08 MED ORDER — LOVASTATIN 40 MG PO TABS: 40.0000 mg | ORAL_TABLET | Freq: Every day | ORAL | 1 refills | Status: DC

## 2018-02-08 NOTE — Progress Notes (Signed)
Subjective:    Patient ID: Julie James, female    DOB: [birth year], 67 y.o.   MRN: [id number]  Shene Maxfield is a 67 y.o. female presenting on 02/08/2018 for Hypertension   HPI Hypertension - She is not checking BP at home or outside of clinic.    - Current medications: Chlorthalidone 12.5 mg once daily, tolerating well without side effects - She is not currently symptomatic. - Pt denies headache, lightheadedness, dizziness, changes in vision, chest tightness/pressure, palpitations, leg swelling, sudden loss of speech or loss of consciousness. - She  reports no regular exercise routine. - Her diet is high in salt, high in fat, and high in carbohydrates.  Restaurant meals out at fast food 5-6x / week.   Hyperlipidemia Patient has mixed hyperlipidemia and is currently managed with lovastatin.  Lovastatin is tolerated well  Without myalgias. - Pt denies changes in vision, chest tightness/pressure, palpitations, shortness of breath, leg pain while walking, leg or arm weakness, and sudden loss of speech or loss of consciousness.   Social History   Tobacco Use  . Smoking status: Never Smoker  . Smokeless tobacco: Never Used  Substance Use Topics  . Alcohol use: No  . Drug use: No    Review of Systems Per HPI unless specifically indicated above     Objective:    BP (!) 154/67 (BP Location: Right Arm, Patient Position: Sitting, Cuff Size: Normal)   Pulse 68   Temp 97.7 F (36.5 C) (Oral)   Ht 5\' 5"  (1.651 m)   Wt 236 lb 6.4 oz (107.2 kg)   BMI 39.34 kg/m   Wt Readings from Last 3 Encounters:  02/08/18 236 lb 6.4 oz (107.2 kg)  08/13/17 246 lb 12.8 oz (111.9 kg)  07/30/17 252 lb 9.6 oz (114.6 kg)    Physical Exam  Constitutional: She is oriented to person, place, and time. She appears well-developed and well-nourished. No distress.  Neck: Normal range of motion. Neck supple. Carotid bruit is not present.  Cardiovascular: Normal rate, regular rhythm, S1 normal,  S2 normal, normal heart sounds and intact distal pulses.  Pulmonary/Chest: Effort normal and breath sounds normal. No respiratory distress.  Abdominal: Soft. Bowel sounds are normal. She exhibits no distension. There is no hepatosplenomegaly. There is no tenderness.  Musculoskeletal: She exhibits no edema (pedal).  Neurological: She is alert and oriented to person, place, and time.  Skin: Skin is warm and dry.  Psychiatric: She has a normal mood and affect. Her behavior is normal.  Vitals reviewed.  Results for orders placed or performed in visit on 07/30/17  Lipid panel  Result Value Ref Range   Cholesterol 180 <200 mg/dL   Triglycerides 116 <150 mg/dL   HDL 46 (L) >50 mg/dL   Total CHOL/HDL Ratio 3.9 <5.0 Ratio   VLDL 23 <30 mg/dL   LDL Cholesterol 111 (H) <100 mg/dL  Comprehensive metabolic panel  Result Value Ref Range   Sodium 138 135 - 146 mmol/L   Potassium 4.2 3.5 - 5.3 mmol/L   Chloride 103 98 - 110 mmol/L   CO2 25 20 - 32 mmol/L   Glucose, Bld 99 65 - 99 mg/dL   BUN 13 7 - 25 mg/dL   Creat 0.86 0.50 - 0.99 mg/dL   Total Bilirubin 0.5 0.2 - 1.2 mg/dL   Alkaline Phosphatase 68 33 - 130 U/L   AST 19 10 - 35 U/L   ALT 15 6 - 29 U/L   Total Protein 7.1 6.1 -  8.1 g/dL   Albumin 4.1 3.6 - 5.1 g/dL   Calcium 9.4 8.6 - 10.4 mg/dL      Assessment & Plan:   Problem List Items Addressed This Visit      Cardiovascular and Mediastinum   Hypertension Persistently uncontrolled hypertension.  BP goal < 130/80.  Pt is not currently working on lifestyle modifications.  Taking medications tolerating well without side effects. No currently identified complications, but no recent labs.  Plan: 1. Increase to chlorthalidone 25 mg once daily.  Will add lisinopril or amlodipine if not effective to bring BP to goal.  Pt currently only wants to take single medication. 2. Obtain labs today: CMP, lipid.  3. Encouraged heart healthy diet and increasing exercise to 30 minutes most days of  the week. 4. Check BP 1-2 x per week at home, keep log, and bring to clinic at next appointment.  Reviewed blood pressure goals less than 130/80 and greater than 100/60 5. Follow up 3 months with BP check in 2 weeks.   Relevant Medications   chlorthalidone (HYGROTON) 25 MG tablet   lovastatin (MEVACOR) 40 MG tablet   Other Relevant Orders   COMPLETE METABOLIC PANEL WITH GFR   Lipid panel     Other   Hyperlipidemia Stable on medications, but without recent lipid panel.  Patient tolerating lovastatin well without side effects.  Plan: Continue lovastatin 40 mg once daily.  Recheck lipid panel today fasting.  Encouraged heart healthy diet with low glycemic carbohydrates.  Encouraged daily physical activity 30 minutes.  Follow-up 3 months.   Relevant Medications   chlorthalidone (HYGROTON) 25 MG tablet   lovastatin (MEVACOR) 40 MG tablet   Other Relevant Orders   COMPLETE METABOLIC PANEL WITH GFR   Lipid panel      Meds ordered this encounter  Medications  . chlorthalidone (HYGROTON) 25 MG tablet    Sig: Take 1 tablet (25 mg total) by mouth daily.    Dispense:  90 tablet    Refill:  1    Order Specific Question:   Supervising Provider    Answer:   Olin Hauser [[id number]]  . lovastatin (MEVACOR) 40 MG tablet    Sig: Take 1 tablet (40 mg total) by mouth at bedtime.    Dispense:  90 tablet    Refill:  1    Order Specific Question:   Supervising Provider    Answer:   Olin Hauser [[id number]]    Follow up plan: Return in about 3 months (around 05/11/2018) for hypertension.  [name], DNP, AGPCNP-BC Adult Gerontology Primary Care Nurse Practitioner [facility] 02/08/2018, 5:20 PM

## 2018-02-08 NOTE — Patient Instructions (Addendum)
Julie James, Thank you for coming in to clinic today.  1. You are doing great overall.  Continue taking lovastatin INCREASE chlorthalidone to 25 mg whole tablet once daily.  2. BP recheck in clinic on 3/22, 3/28, OR 3/29.  BP goal is less than 130/80.  Always higher than 100/60 with medication.  Please schedule a follow-up appointment with [name], AGNP. Return in about 3 months (around 05/11/2018) for hypertension.  If you have any other questions or concerns, please feel free to call the clinic or send a message through [place]. You may also schedule an earlier appointment if necessary.  You will receive a survey after today's visit either digitally by e-mail or paper by [employer]. Your experiences and feedback matter to [place].  Please respond so we know how we are doing as we provide care for you.   [name], DNP, AGNP-BC Adult Gerontology Nurse Practitioner [place]

## 2018-02-09 LAB — COMPLETE METABOLIC PANEL WITH GFR
AG Ratio: 1.5 (calc) (ref 1.0–2.5)
ALT: 15 U/L (ref 6–29)
AST: 19 U/L (ref 10–35)
Albumin: 4.5 g/dL (ref 3.6–5.1)
Alkaline phosphatase (APISO): 68 U/L (ref 33–130)
BUN: 18 mg/dL (ref 7–25)
CO2: 30 mmol/L (ref 20–32)
Calcium: 9.9 mg/dL (ref 8.6–10.4)
Chloride: 102 mmol/L (ref 98–110)
Creat: 0.82 mg/dL (ref 0.50–0.99)
GFR, Est African American: 88 mL/min/{1.73_m2} (ref >=60)
GFR, Est Non African American: 76 mL/min/{1.73_m2} (ref >=60)
Globulin: 3 g/dL (calc) (ref 1.9–3.7)
Glucose, Bld: 93 mg/dL (ref 65–99)
Potassium: 4.1 mmol/L (ref 3.5–5.3)
Sodium: 140 mmol/L (ref 135–146)
Total Bilirubin: 0.5 mg/dL (ref 0.2–1.2)
Total Protein: 7.5 g/dL (ref 6.1–8.1)

## 2018-02-09 LAB — LIPID PANEL
Cholesterol: 195 mg/dL (ref <200)
HDL: 49 mg/dL — ABNORMAL LOW (ref >50)
LDL Cholesterol (Calc): 127 mg/dL (calc) — ABNORMAL HIGH
Non-HDL Cholesterol (Calc): 146 mg/dL (calc) — ABNORMAL HIGH (ref <130)
Total CHOL/HDL Ratio: 4 (calc) (ref <5.0)
Triglycerides: 90 mg/dL (ref <150)

## 2018-03-01 ENCOUNTER — Ambulatory Visit (INDEPENDENT_AMBULATORY_CARE_PROVIDER_SITE_OTHER): Payer: Self-pay

## 2018-03-01 VITALS — BP 140/80 | HR 76

## 2018-03-01 DIAGNOSIS — I159 Secondary hypertension, unspecified: Secondary | ICD-10-CM

## 2018-03-01 NOTE — Progress Notes (Signed)
patient is here for nurse visit for B/CP check.

## 2018-04-12 ENCOUNTER — Telehealth: Payer: Self-pay | Admitting: Nurse Practitioner

## 2018-04-12 DIAGNOSIS — I10 Essential (primary) hypertension: Secondary | ICD-10-CM

## 2018-04-12 DIAGNOSIS — E785 Hyperlipidemia, unspecified: Secondary | ICD-10-CM

## 2018-04-12 MED ORDER — CHLORTHALIDONE 25 MG PO TABS: 25.0000 mg | ORAL_TABLET | Freq: Every day | ORAL | 1 refills | Status: DC

## 2018-04-12 MED ORDER — LOVASTATIN 40 MG PO TABS: 40.0000 mg | ORAL_TABLET | Freq: Every day | ORAL | 1 refills | Status: DC

## 2018-04-12 NOTE — Telephone Encounter (Signed)
Pt needs refills on lovastatin and chlorthalidone sent to [facility].  Her call back number is [phone]

## 2018-05-10 ENCOUNTER — Ambulatory Visit: Payer: Self-pay | Admitting: Nurse Practitioner

## 2018-05-17 ENCOUNTER — Other Ambulatory Visit: Payer: Self-pay

## 2018-05-17 ENCOUNTER — Encounter: Payer: Self-pay | Admitting: Nurse Practitioner

## 2018-05-17 ENCOUNTER — Ambulatory Visit (INDEPENDENT_AMBULATORY_CARE_PROVIDER_SITE_OTHER): Payer: Self-pay | Admitting: Nurse Practitioner

## 2018-05-17 VITALS — BP 133/69 | HR 70 | Temp 97.7°F | Ht 65.0 in | Wt 241.1 lb

## 2018-05-17 DIAGNOSIS — I10 Essential (primary) hypertension: Secondary | ICD-10-CM

## 2018-05-17 DIAGNOSIS — E782 Mixed hyperlipidemia: Secondary | ICD-10-CM

## 2018-05-17 MED ORDER — CHLORTHALIDONE 25 MG PO TABS: 25.0000 mg | ORAL_TABLET | Freq: Every day | ORAL | 1 refills | Status: DC

## 2018-05-17 MED ORDER — SIMVASTATIN 40 MG PO TABS: 40.0000 mg | ORAL_TABLET | Freq: Every day | ORAL | 1 refills | Status: DC

## 2018-05-17 NOTE — Progress Notes (Signed)
Subjective:    Patient ID: Julie James, female    DOB: Apr 20, 1951, 67 y.o.   MRN: [id number]  Julie James is a 67 y.o. female presenting on 05/17/2018 for Hypertension   HPI Hypertension - She is not checking BP at home or outside of clinic.    - Current medications: chlorthalidone 25 mg once daily, tolerating well without side effects - She is not currently symptomatic. - Pt denies headache, lightheadedness, dizziness, changes in vision, chest tightness/pressure, palpitations, leg swelling, sudden loss of speech or loss of consciousness. - She  reports no regular exercise routine. - Her diet is high in salt, high in fat, and high in carbohydrates.   Hyperlipidemia Pt is also following up for high cholesterol.  She is taking statin 40 mg once daily and tolerating well without myalgias or arthralgias.  She also declines cognitive dysfunction.  She does have family history of heart disease, but no personal history of heart attack or stroke. - Pt denies changes in vision, chest tightness/pressure, palpitations, shortness of breath, leg pain while walking, leg or arm weakness, and sudden loss of speech or loss of consciousness.   Social History   Tobacco Use  . Smoking status: Never Smoker  . Smokeless tobacco: Never Used  Substance Use Topics  . Alcohol use: No  . Drug use: No    Review of Systems Per HPI unless specifically indicated above     Objective:    BP 133/69 (BP Location: Right Arm, Patient Position: Sitting, Cuff Size: Large)   Pulse 70   Temp 97.7 F (36.5 C) (Oral)   Ht 5\' 5"  (1.651 m)   Wt 241 lb 1.6 oz (109.4 kg)   BMI 40.12 kg/m   Wt Readings from Last 3 Encounters:  05/17/18 241 lb 1.6 oz (109.4 kg)  02/08/18 236 lb 6.4 oz (107.2 kg)  08/13/17 246 lb 12.8 oz (111.9 kg)    Physical Exam  Constitutional: She is oriented to person, place, and time. She appears well-developed and well-nourished. No distress.  HENT:  Head: Normocephalic and  atraumatic.  Neck: Normal range of motion. Neck supple. Carotid bruit is not present.  Cardiovascular: Normal rate, regular rhythm, S1 normal, S2 normal, normal heart sounds and intact distal pulses.  Pulmonary/Chest: Effort normal and breath sounds normal. No respiratory distress.  Musculoskeletal: She exhibits no edema (pedal).  Neurological: She is alert and oriented to person, place, and time.  Skin: Skin is warm and dry. Capillary refill takes less than 2 seconds.  Psychiatric: She has a normal mood and affect. Her behavior is normal. Judgment and thought content normal.  Vitals reviewed.   Results for orders placed or performed in visit on 02/08/18  COMPLETE METABOLIC PANEL WITH GFR  Result Value Ref Range   Glucose, Bld 93 65 - 99 mg/dL   BUN 18 7 - 25 mg/dL   Creat 0.82 0.50 - 0.99 mg/dL   GFR, Est Non African American 76 > OR = 60 mL/min/1.25m2   GFR, Est African American 88 > OR = 60 mL/min/1.85m2   BUN/Creatinine Ratio NOT APPLICABLE 6 - 22 (calc)   Sodium 140 135 - 146 mmol/L   Potassium 4.1 3.5 - 5.3 mmol/L   Chloride 102 98 - 110 mmol/L   CO2 30 20 - 32 mmol/L   Calcium 9.9 8.6 - 10.4 mg/dL   Total Protein 7.5 6.1 - 8.1 g/dL   Albumin 4.5 3.6 - 5.1 g/dL   Globulin 3.0 1.9 - 3.7 g/dL (  calc)   AG Ratio 1.5 1.0 - 2.5 (calc)   Total Bilirubin 0.5 0.2 - 1.2 mg/dL   Alkaline phosphatase (APISO) 68 33 - 130 U/L   AST 19 10 - 35 U/L   ALT 15 6 - 29 U/L  Lipid panel  Result Value Ref Range   Cholesterol 195 <200 mg/dL   HDL 49 (L) >50 mg/dL   Triglycerides 90 <150 mg/dL   LDL Cholesterol (Calc) 127 (H) mg/dL (calc)   Total CHOL/HDL Ratio 4.0 <5.0 (calc)   Non-HDL Cholesterol (Calc) 146 (H) <130 mg/dL (calc)      Assessment & Plan:   Problem List Items Addressed This Visit      Cardiovascular and Mediastinum   Hypertension    Improved control today with BP near goal < 130/80.   Plan: 1. Continue chlorthalidone without change. Take 25 mg once daily. 2. Reviewed  BP goals,  pt will monitor BP closely at home. 3. BP check in 2 weeks in clinic.  Must bring BP log on new medication prior to refill.  4. Encouraged heart healthy diet. 5. Follow up 3 months.        Relevant Medications   chlorthalidone (HYGROTON) 25 MG tablet   Other Relevant Orders   COMPLETE METABOLIC PANEL WITH GFR     Other   Hyperlipidemia - Primary    Last lipid panel March 2019 with LDL above goal < 100.  Pt on lovastatin and tolerating well w/o side effects. No recent ASCVD events or signs and symptoms of MI/CVA.  Plan: 1. Recheck lipid panel and CMP to determine liver function.  2. STOP lovastatin.   - START simvastatin 40 mg once daily. 3. Follow up 3 months.      Relevant Medications   chlorthalidone (HYGROTON) 25 MG tablet   Other Relevant Orders   Lipid panel   COMPLETE METABOLIC PANEL WITH GFR      Meds ordered this encounter  Medications  . chlorthalidone (HYGROTON) 25 MG tablet    Sig: Take 1 tablet (25 mg total) by mouth daily.    Dispense:  90 tablet    Refill:  1    Order Specific Question:   Supervising Provider    Answer:   Olin Hauser [[id number]]  . simvastatin (ZOCOR) 40 MG tablet    Sig: Take 1 tablet (40 mg total) by mouth at bedtime.    Dispense:  90 tablet    Refill:  1    Order Specific Question:   Supervising Provider    Answer:   Olin Hauser [[id number]]    Follow up plan: Return in about 3 months (around 08/17/2018) for hypertension, cholesterol.  [name], DNP, AGPCNP-BC Adult Gerontology Primary Care Nurse Practitioner [facility] 05/17/2018, 2:13 PM

## 2018-05-17 NOTE — Patient Instructions (Signed)
Julie James,   Thank you for coming in to clinic today.  1. Change lovastatin to simvastatin 40 mg once daily.  2. Continue chlorthalidone without change  3. Increase your physical activity until you are increasing your heart rate for 30 minutes on most days of the week.  4. Eat a heart healthy diet high in vegetables, lean meat, fruit, and whole grains.  5. You will be due for FASTING BLOOD WORK.  This means you should eat no food or drink after midnight.  Drink only water or coffee without cream/sugar on the morning of your lab visit. - Please go ahead and schedule a "Lab Only" visit in the morning at the clinic for lab draw 3 days before your next office visit. - Your results will be available about 2-3 days after blood draw.  If you have set up a MyChart account, you can can log in to MyChart online to view your results and a brief explanation. Also, we can discuss your results together at your next office visit if you would like.   Please schedule a follow-up appointment with [name], AGNP. Return in about 3 months (around 08/17/2018) for hypertension, cholesterol.  If you have any other questions or concerns, please feel free to call the clinic or send a message through [place]. You may also schedule an earlier appointment if necessary.  You will receive a survey after today's visit either digitally by e-mail or paper by [employer]. Your experiences and feedback matter to [place].  Please respond so we know how we are doing as we provide care for you.   [name], DNP, AGNP-BC Adult Gerontology Nurse Practitioner [place]

## 2018-07-17 ENCOUNTER — Other Ambulatory Visit: Payer: Self-pay

## 2018-07-17 ENCOUNTER — Other Ambulatory Visit: Payer: Self-pay | Admitting: Nurse Practitioner

## 2018-07-17 DIAGNOSIS — E782 Mixed hyperlipidemia: Secondary | ICD-10-CM

## 2018-08-12 ENCOUNTER — Encounter: Payer: Self-pay | Admitting: Nurse Practitioner

## 2018-08-12 NOTE — Assessment & Plan Note (Addendum)
Last lipid panel March 2019 with LDL above goal < 100.  Pt on lovastatin and tolerating well w/o side effects. No recent ASCVD events or signs and symptoms of MI/CVA.  Plan: 1. Recheck lipid panel and CMP to determine liver function.  2. STOP lovastatin.   - START simvastatin 40 mg once daily. 3. Follow up 3 months.

## 2018-08-12 NOTE — Assessment & Plan Note (Signed)
Improved control today with BP near goal < 130/80.   Plan: 1. Continue chlorthalidone without change. Take 25 mg once daily. 2. Reviewed BP goals,  pt will monitor BP closely at home. 3. BP check in 2 weeks in clinic.  Must bring BP log on new medication prior to refill.  4. Encouraged heart healthy diet. 5. Follow up 3 months.

## 2018-08-19 ENCOUNTER — Ambulatory Visit (INDEPENDENT_AMBULATORY_CARE_PROVIDER_SITE_OTHER): Payer: Medicare Other | Admitting: Nurse Practitioner

## 2018-08-19 ENCOUNTER — Encounter: Payer: Self-pay | Admitting: Nurse Practitioner

## 2018-08-19 ENCOUNTER — Other Ambulatory Visit: Payer: Self-pay

## 2018-08-19 VITALS — BP 132/55 | HR 71 | Temp 98.1°F | Ht 65.0 in | Wt 243.0 lb

## 2018-08-19 DIAGNOSIS — G8929 Other chronic pain: Secondary | ICD-10-CM

## 2018-08-19 DIAGNOSIS — E782 Mixed hyperlipidemia: Secondary | ICD-10-CM

## 2018-08-19 DIAGNOSIS — M25512 Pain in left shoulder: Secondary | ICD-10-CM

## 2018-08-19 DIAGNOSIS — I10 Essential (primary) hypertension: Secondary | ICD-10-CM

## 2018-08-19 MED ORDER — CHLORTHALIDONE 25 MG PO TABS: 25.0000 mg | ORAL_TABLET | Freq: Every day | ORAL | 6 refills | Status: DC

## 2018-08-19 MED ORDER — SIMVASTATIN 40 MG PO TABS: 40.0000 mg | ORAL_TABLET | Freq: Every day | ORAL | 6 refills | Status: DC

## 2018-08-19 NOTE — Progress Notes (Signed)
Subjective:    Patient ID: Julie James, female    DOB: 08/06/1951, 67 y.o.   MRN: [id number]  Julie James is a 67 y.o. female presenting on 08/19/2018 for Hypertension; Hyperlipidemia; and Shoulder Pain (intemittent left shoulder throbbing pain )   HPI Hypertension - She is not checking BP at home or outside of clinic regularly.  Readings were around 130-140 about 2 months ago - Current medications: chlorthalidone 25 mg once daily, tolerating well without side effects - She is not currently symptomatic. - Pt denies headache, lightheadedness, dizziness, changes in vision, chest tightness/pressure, palpitations, leg swelling, sudden loss of speech or loss of consciousness. - She  reports no regular exercise routine. - Her diet is moderate in salt, moderate in fat, and moderate in carbohydrates.   Hyperlipidemia Patient is taking simvastatin 40 mg once daily and is tolerating well without side effects.   Pt denies changes in vision, chest tightness/pressure, palpitations, shortness of breath, leg pain while walking, leg or arm weakness, and sudden loss of speech or loss of consciousness.   Shoulder Pain Patient has had intermittent left shoulder pain, throbbing over the last 1 month.  It was worse about 1 month ago, and has improved some since initial pain.  Throbbing is improving.  Patient has had difficulty lying on left side for sleeping since pain started.  Over last 2 weeks, worst pain is 7/10, now feels no pain without movement. - Patient was taking Tylenol about 2 weeks with initial pain.    Social History   Tobacco Use  . Smoking status: Never Smoker  . Smokeless tobacco: Never Used  Substance Use Topics  . Alcohol use: No  . Drug use: No    Review of Systems Per HPI unless specifically indicated above     Objective:    BP (!) 132/55 (BP Location: Left Arm, Patient Position: Sitting, Cuff Size: Large)   Pulse 71   Temp 98.1 F (36.7 C) (Oral)   Ht 5\' 5"   (1.651 m)   Wt 243 lb (110.2 kg)   BMI 40.44 kg/m   Wt Readings from Last 3 Encounters:  08/19/18 243 lb (110.2 kg)  05/17/18 241 lb 1.6 oz (109.4 kg)  02/08/18 236 lb 6.4 oz (107.2 kg)    Physical Exam  Constitutional: She is oriented to person, place, and time. She appears well-developed and well-nourished. No distress.  HENT:  Head: Normocephalic and atraumatic.  Cardiovascular: Normal rate, regular rhythm, S1 normal, S2 normal, normal heart sounds and intact distal pulses.  Pulmonary/Chest: Effort normal and breath sounds normal. No respiratory distress.  Musculoskeletal:  LEFT Shoulder Inspection: Normal appearance bilateral symmetrical Palpation: Non-tender to palpation over anterior, lateral, or posterior shoulder  ROM: Full intact active ROM forward flexion, abduction, internal / external rotation, symmetrical.  Mild pain with internal rotation, no limitation with ROM or weakness. Special Testing: Rotator cuff testing negative for weakness with supraspinatus full can and empty can test, O'brien's negative for labral pain, Hawkin's AC impingement negative for pain Strength: Normal strength 5/5 flex/ext, ext rot / int rot, grip, rotator cuff str testing. Neurovascular: Distally intact pulses, sensation to light touch   Neurological: She is alert and oriented to person, place, and time.  Skin: Skin is warm and dry. Capillary refill takes less than 2 seconds.  Psychiatric: She has a normal mood and affect. Her behavior is normal. Judgment and thought content normal.  Vitals reviewed.   Results for orders placed or performed in visit on 02/08/18  COMPLETE METABOLIC PANEL WITH GFR  Result Value Ref Range   Glucose, Bld 93 65 - 99 mg/dL   BUN 18 7 - 25 mg/dL   Creat 0.82 0.50 - 0.99 mg/dL   GFR, Est Non African American 76 > OR = 60 mL/min/1.33m2   GFR, Est African American 88 > OR = 60 mL/min/1.70m2   BUN/Creatinine Ratio NOT APPLICABLE 6 - 22 (calc)   Sodium 140 135 - 146  mmol/L   Potassium 4.1 3.5 - 5.3 mmol/L   Chloride 102 98 - 110 mmol/L   CO2 30 20 - 32 mmol/L   Calcium 9.9 8.6 - 10.4 mg/dL   Total Protein 7.5 6.1 - 8.1 g/dL   Albumin 4.5 3.6 - 5.1 g/dL   Globulin 3.0 1.9 - 3.7 g/dL (calc)   AG Ratio 1.5 1.0 - 2.5 (calc)   Total Bilirubin 0.5 0.2 - 1.2 mg/dL   Alkaline phosphatase (APISO) 68 33 - 130 U/L   AST 19 10 - 35 U/L   ALT 15 6 - 29 U/L  Lipid panel  Result Value Ref Range   Cholesterol 195 <200 mg/dL   HDL 49 (L) >50 mg/dL   Triglycerides 90 <150 mg/dL   LDL Cholesterol (Calc) 127 (H) mg/dL (calc)   Total CHOL/HDL Ratio 4.0 <5.0 (calc)   Non-HDL Cholesterol (Calc) 146 (H) <130 mg/dL (calc)      Assessment & Plan:   Problem List Items Addressed This Visit      Cardiovascular and Mediastinum   Hypertension - Primary    Continued control.  Patient continues to have BP very near goal < 130/80.   Plan: 1. Continue chlorthalidone without change. Take 25 mg once daily. 2. Reviewed BP goals,  pt will monitor BP closely at home. 3. Encouraged heart healthy diet. 4. Follow up 3 months.        Relevant Medications   simvastatin (ZOCOR) 40 MG tablet   chlorthalidone (HYGROTON) 25 MG tablet   Other Relevant Orders   COMPLETE METABOLIC PANEL WITH GFR (Completed)   Lipid panel (Completed)     Other   Hyperlipidemia    Last lipid panel March 2019 with LDL above goal < 100.  Pt now on simvastatin after switching from lovastatin 3 months ago and tolerating well w/o side effects. No recent ASCVD events or signs and symptoms of MI/CVA.  Plan: 1. Recheck lipid panel and CMP to determine liver function.  2. Continue simvastatin 40 mg once daily. 3. Encouraged low glycemic diet 4. Follow up 3 months.      Relevant Medications   simvastatin (ZOCOR) 40 MG tablet   chlorthalidone (HYGROTON) 25 MG tablet   Other Relevant Orders   COMPLETE METABOLIC PANEL WITH GFR (Completed)   Lipid panel (Completed)    Other Visit Diagnoses     Chronic left shoulder pain        Pain likely chronic and related to osteoporosis.  No concern for rotator cuff injury.  Plan:  1. Treat with OTC pain meds (acetaminophen and ibuprofen).  Discussed alternate dosing and max dosing. 2. Apply heat and/or ice to affected area. 3. May also apply a muscle rub with lidocaine or lidocaine patch after heat or ice. 4. Provided ROM exercises for patient to continue.   5. Follow up prn.  Can consider steroid injection at any time if patient desires.   Meds ordered this encounter  Medications  . simvastatin (ZOCOR) 40 MG tablet    Sig: Take 1 tablet (  40 mg total) by mouth at bedtime.    Dispense:  30 tablet    Refill:  6  . chlorthalidone (HYGROTON) 25 MG tablet    Sig: Take 1 tablet (25 mg total) by mouth daily.    Dispense:  30 tablet    Refill:  6    Follow up plan: Return in about 6 months (around 02/17/2019) for hypertension, cholesterol AND welcome to Medicare with Eulice Rutledge (40 min) before 12/03/2018.  [name], DNP, AGPCNP-BC Adult Gerontology Primary Care Nurse Practitioner [facility] 08/19/2018, 8:39 AM

## 2018-08-19 NOTE — Patient Instructions (Addendum)
Julie James,   Thank you for coming in to clinic today.  1. Great work with your diet and activity levels.  Work to increase activity a little more so that your heart rate and breathing are increased for about 20 minutes most days of the week.  2. NO changes to medicines today.  3. For your shoulder pain, Continue Tylenol as needed.  You may take up to 1,000 mg three times per day.  This is 2 Extra Strength Tylenol tablets. - Tylenol Arthritis is 650 mg tablets, only take 1 tablet per dose.  4. Continue your ROM activities for your shoulder.  Please schedule a follow-up appointment with [name], AGNP. Return in about 6 months (around 02/17/2019) for hypertension, cholesterol.  If you have any other questions or concerns, please feel free to call the clinic or send a message through [name]. You may also schedule an earlier appointment if necessary.  You will receive a survey after today's visit either digitally by e-mail or paper by [employer]. Your experiences and feedback matter to [place].  Please respond so we know how we are doing as we provide care for you.   [name], DNP, AGNP-BC Adult Gerontology Nurse Practitioner [place]

## 2018-08-20 ENCOUNTER — Encounter: Payer: Self-pay | Admitting: Nurse Practitioner

## 2018-08-20 LAB — LIPID PANEL
Cholesterol: 179 mg/dL (ref <200)
HDL: 48 mg/dL — ABNORMAL LOW (ref >50)
LDL Cholesterol (Calc): 108 mg/dL (calc) — ABNORMAL HIGH
Non-HDL Cholesterol (Calc): 131 mg/dL (calc) — ABNORMAL HIGH (ref <130)
Total CHOL/HDL Ratio: 3.7 (calc) (ref <5.0)
Triglycerides: 121 mg/dL (ref <150)

## 2018-08-20 LAB — COMPLETE METABOLIC PANEL WITH GFR
AG Ratio: 1.3 (calc) (ref 1.0–2.5)
ALT: 17 U/L (ref 6–29)
AST: 18 U/L (ref 10–35)
Albumin: 4.3 g/dL (ref 3.6–5.1)
Alkaline phosphatase (APISO): 68 U/L (ref 33–130)
BUN: 15 mg/dL (ref 7–25)
CO2: 31 mmol/L (ref 20–32)
Calcium: 10 mg/dL (ref 8.6–10.4)
Chloride: 101 mmol/L (ref 98–110)
Creat: 0.83 mg/dL (ref 0.50–0.99)
GFR, Est African American: 86 mL/min/{1.73_m2} (ref >=60)
GFR, Est Non African American: 74 mL/min/{1.73_m2} (ref >=60)
Globulin: 3.3 g/dL (calc) (ref 1.9–3.7)
Glucose, Bld: 100 mg/dL — ABNORMAL HIGH (ref 65–99)
Potassium: 4 mmol/L (ref 3.5–5.3)
Sodium: 140 mmol/L (ref 135–146)
Total Bilirubin: 0.4 mg/dL (ref 0.2–1.2)
Total Protein: 7.6 g/dL (ref 6.1–8.1)

## 2018-08-20 NOTE — Assessment & Plan Note (Addendum)
Last lipid panel March 2019 with LDL above goal < 100.  Pt now on simvastatin after switching from lovastatin 3 months ago and tolerating well w/o side effects. No recent ASCVD events or signs and symptoms of MI/CVA.  Plan: 1. Recheck lipid panel and CMP to determine liver function.  2. Continue simvastatin 40 mg once daily. 3. Encouraged low glycemic diet 4. Follow up 6 months.

## 2018-08-20 NOTE — Assessment & Plan Note (Addendum)
Continued control.  Patient continues to have BP very near goal < 130/80.   Plan: 1. Continue chlorthalidone without change. Take 25 mg once daily. 2. Reviewed BP goals,  pt will monitor BP closely at home. 3. Encouraged heart healthy diet. 4. Follow up 6 months.

## 2018-08-22 DIAGNOSIS — Z23 Encounter for immunization: Secondary | ICD-10-CM | POA: Diagnosis not present

## 2018-12-02 ENCOUNTER — Ambulatory Visit: Payer: Medicare Other | Admitting: Nurse Practitioner

## 2019-02-17 ENCOUNTER — Ambulatory Visit (INDEPENDENT_AMBULATORY_CARE_PROVIDER_SITE_OTHER): Payer: Medicare Other | Admitting: Nurse Practitioner

## 2019-02-17 ENCOUNTER — Ambulatory Visit: Payer: Medicare Other | Admitting: Nurse Practitioner

## 2019-02-17 ENCOUNTER — Other Ambulatory Visit: Payer: Self-pay

## 2019-02-17 VITALS — BP 132/68 | HR 75 | Resp 16 | Ht 65.0 in | Wt 237.8 lb

## 2019-02-17 DIAGNOSIS — E041 Nontoxic single thyroid nodule: Secondary | ICD-10-CM

## 2019-02-17 DIAGNOSIS — I10 Essential (primary) hypertension: Secondary | ICD-10-CM

## 2019-02-17 DIAGNOSIS — Z1239 Encounter for other screening for malignant neoplasm of breast: Secondary | ICD-10-CM | POA: Diagnosis not present

## 2019-02-17 DIAGNOSIS — E782 Mixed hyperlipidemia: Secondary | ICD-10-CM | POA: Diagnosis not present

## 2019-02-17 MED ORDER — SIMVASTATIN 40 MG PO TABS: 40.0000 mg | ORAL_TABLET | Freq: Every day | ORAL | 1 refills | Status: DC

## 2019-02-17 MED ORDER — CHLORTHALIDONE 25 MG PO TABS: 25.0000 mg | ORAL_TABLET | Freq: Every day | ORAL | 1 refills | Status: DC

## 2019-02-17 NOTE — Patient Instructions (Addendum)
Julie James,   Thank you for coming in to clinic today.  1. Continue working on staying active and eating healthy.  2. Continue all meds today without changes.   Cholesterol med may change after labs.  3. Thyroid nodule: We need Ultrasound follow-up.  Imaging center in [place] will call you to schedule.  Please schedule a follow-up appointment with [name], AGNP. Return in about 6 months (around 08/20/2019) for hypertension, cholesterol.  If you have any other questions or concerns, please feel free to call the clinic or send a message through [place]. You may also schedule an earlier appointment if necessary.  You will receive a survey after today's visit either digitally by e-mail or paper by [employer]. Your experiences and feedback matter to [place].  Please respond so we know how we are doing as we provide care for you.   [name], DNP, AGNP-BC Adult Gerontology Nurse Practitioner [place]

## 2019-02-17 NOTE — Progress Notes (Signed)
Subjective:    Patient ID: Julie James, female    DOB: 11-06-1951, 68 y.o.   MRN: [id number]  Julie James is a 68 y.o. female presenting on 02/17/2019 for Hypertension   HPI Hypertension - She is not checking BP at home or outside of clinic.    - Current medications: chlorthalidone 25 mg once daily, tolerating well without side effects - She is not currently symptomatic. - Pt denies headache, lightheadedness, dizziness, changes in vision, chest tightness/pressure, palpitations, leg swelling, sudden loss of speech or loss of consciousness. - She  reports no regular exercise routine. - Her diet is moderate in salt, moderate in fat, and moderate in carbohydrates.   Hyperlipidemia Patient is currently taking simvastatin 40 mg once daily and is tolerating well.   - Pt denies changes in vision, chest tightness/pressure, palpitations, shortness of breath, leg pain while walking, leg or arm weakness, and sudden loss of speech or loss of consciousness. - No known family history of ASCVD events  Thyroid nodule Last [place] follow-up was 2015.  Patient is due for repeat imaging with [place].  She has had normal thyroid function on labs at last checks. - She denies fatigue, excess energy, weight changes, heart racing, heart palpitations, heat and cold intolerance, changes in hair/skin/nails, and lower leg swelling.  - She does not have any compressive symptoms to include difficulty swallowing, globus sensation, or difficulty breathing when lying flat.    Social History   Tobacco Use  . Smoking status: Never Smoker  . Smokeless tobacco: Never Used  Substance Use Topics  . Alcohol use: No  . Drug use: No    Review of Systems Per HPI unless specifically indicated above     Objective:    BP 132/68 (BP Location: Left Arm, Patient Position: Sitting, Cuff Size: Large)   Pulse 75   Resp 16   Ht 5\' 5"  (1.651 m)   Wt 237 lb 12.8 oz (107.9 kg)   SpO2 99%   BMI 39.57 kg/m    Wt  Readings from Last 3 Encounters:  02/17/19 237 lb 12.8 oz (107.9 kg)  08/19/18 243 lb (110.2 kg)  05/17/18 241 lb 1.6 oz (109.4 kg)    Physical Exam Vitals signs reviewed.  Constitutional:      General: She is not in acute distress.    Appearance: She is well-developed. She is obese.  HENT:     Head: Normocephalic and atraumatic.  Neck:     Musculoskeletal: Full passive range of motion without pain, normal range of motion and neck supple.     Thyroid: Thyroid mass (right lobe w goiter) and thyromegaly present. No thyroid tenderness.     Trachea: Trachea and phonation normal.  Cardiovascular:     Rate and Rhythm: Normal rate and regular rhythm.     Pulses:          Radial pulses are 2+ on the right side and 2+ on the left side.       Posterior tibial pulses are 1+ on the right side and 1+ on the left side.     Heart sounds: Normal heart sounds, S1 normal and S2 normal.  Pulmonary:     Effort: Pulmonary effort is normal. No respiratory distress.     Breath sounds: Normal breath sounds and air entry.  Abdominal:     General: Abdomen is flat. Bowel sounds are normal. There is no distension.     Palpations: Abdomen is soft.     Tenderness: There  is no abdominal tenderness.     Hernia: No hernia is present.  Musculoskeletal:     Right lower leg: No edema.     Left lower leg: No edema.  Lymphadenopathy:     Cervical: No cervical adenopathy.  Skin:    General: Skin is warm and dry.     Capillary Refill: Capillary refill takes less than 2 seconds.  Neurological:     General: No focal deficit present.     Mental Status: She is alert and oriented to person, place, and time. Mental status is at baseline.  Psychiatric:        Attention and Perception: Attention normal.        Mood and Affect: Mood and affect normal.        Behavior: Behavior normal. Behavior is cooperative.        Thought Content: Thought content normal.        Judgment: Judgment normal.    Results for orders  placed or performed in visit on 02/17/19  COMPLETE METABOLIC PANEL WITH GFR  Result Value Ref Range   Glucose, Bld 98 65 - 99 mg/dL   BUN 12 7 - 25 mg/dL   Creat 0.89 0.50 - 0.99 mg/dL   GFR, Est Non African American 68 > OR = 60 mL/min/1.79m2   GFR, Est African American 79 > OR = 60 mL/min/1.45m2   BUN/Creatinine Ratio NOT APPLICABLE 6 - 22 (calc)   Sodium 139 135 - 146 mmol/L   Potassium 3.8 3.5 - 5.3 mmol/L   Chloride 100 98 - 110 mmol/L   CO2 31 20 - 32 mmol/L   Calcium 9.7 8.6 - 10.4 mg/dL   Total Protein 7.5 6.1 - 8.1 g/dL   Albumin 4.2 3.6 - 5.1 g/dL   Globulin 3.3 1.9 - 3.7 g/dL (calc)   AG Ratio 1.3 1.0 - 2.5 (calc)   Total Bilirubin 0.5 0.2 - 1.2 mg/dL   Alkaline phosphatase (APISO) 66 37 - 153 U/L   AST 20 10 - 35 U/L   ALT 16 6 - 29 U/L  Lipid panel  Result Value Ref Range   Cholesterol 169 <200 mg/dL   HDL 48 (L) > OR = 50 mg/dL   Triglycerides 94 <150 mg/dL   LDL Cholesterol (Calc) 102 (H) mg/dL (calc)   Total CHOL/HDL Ratio 3.5 <5.0 (calc)   Non-HDL Cholesterol (Calc) 121 <130 mg/dL (calc)  TSH + free T4  Result Value Ref Range   TSH W/REFLEX TO FT4 3.55 0.40 - 4.50 mIU/L      Assessment & Plan:   Problem List Items Addressed This Visit      Cardiovascular and Mediastinum   Hypertension - Primary Controlled hypertension.  BP goal < 130/80.  Pt is maintaining some lifestyle modifications.  Taking medications tolerating well without side effects.  Complicates obesity class II.  Plan: 1. Continue taking chlorthalidone 2. Obtain labs   3. Encouraged heart healthy diet and increasing exercise to 30 minutes most days of the week. 4. Check BP 1-2 x per week at home, keep log, and bring to clinic at next appointment. 5. Follow up 6 months.     Relevant Medications   chlorthalidone (HYGROTON) 25 MG tablet   simvastatin (ZOCOR) 40 MG tablet   Other Relevant Orders   COMPLETE METABOLIC PANEL WITH GFR (Completed)     Endocrine   Thyroid nodule  Status  unknown.  Last [place] 2015. Due for repeat today.  Possibly enlarging goiter/nodules.  Recheck  labs.  Continue without any thyroid meds today.  Followup prn after labs/test (elective - can be delayed due to COVID-19 transmission reduction efforts).  Follow-up 6 months.   Relevant Orders   TSH + free T4 (Completed)   US THYROID     Other   Hyperlipidemia Previously controlled hyperlipidemia.  Currently taking statin. No new ASCVD events since last visit.   - No personal history of prior ASCVD events - No family history of ASCVD events/atherosclerosis.  Plan: 1. Continue medications without changes 2. Discussed low glycemic/heart healthy diet. 3. Discussed increasing exercise to 30 minutes most days of the week. 4. Follow-up with labs in about 6 months.    Relevant Medications   chlorthalidone (HYGROTON) 25 MG tablet   simvastatin (ZOCOR) 40 MG tablet   Other Relevant Orders   COMPLETE METABOLIC PANEL WITH GFR (Completed)   Lipid panel (Completed)    Other Visit Diagnoses    Breast cancer screening       Relevant Orders   MM DIGITAL SCREENING BILATERAL      Meds ordered this encounter  Medications  . chlorthalidone (HYGROTON) 25 MG tablet    Sig: Take 1 tablet (25 mg total) by mouth daily.    Dispense:  90 tablet    Refill:  1    Order Specific Question:   Supervising Provider    Answer:   Olin Hauser [[id number]]  . simvastatin (ZOCOR) 40 MG tablet    Sig: Take 1 tablet (40 mg total) by mouth at bedtime.    Dispense:  90 tablet    Refill:  1    Order Specific Question:   Supervising Provider    Answer:   Olin Hauser [[id number]]    Follow up plan: Return in about 6 months (around 08/20/2019) for hypertension, cholesterol.  [name], DNP, AGPCNP-BC Adult Gerontology Primary Care Nurse Practitioner [facility] 02/17/2019, 8:37 AM

## 2019-02-18 LAB — COMPLETE METABOLIC PANEL WITH GFR
AG Ratio: 1.3 (calc) (ref 1.0–2.5)
ALT: 16 U/L (ref 6–29)
AST: 20 U/L (ref 10–35)
Albumin: 4.2 g/dL (ref 3.6–5.1)
Alkaline phosphatase (APISO): 66 U/L (ref 37–153)
BUN: 12 mg/dL (ref 7–25)
CO2: 31 mmol/L (ref 20–32)
Calcium: 9.7 mg/dL (ref 8.6–10.4)
Chloride: 100 mmol/L (ref 98–110)
Creat: 0.89 mg/dL (ref 0.50–0.99)
GFR, Est African American: 79 mL/min/{1.73_m2} (ref >=60)
GFR, Est Non African American: 68 mL/min/{1.73_m2} (ref >=60)
Globulin: 3.3 g/dL (calc) (ref 1.9–3.7)
Glucose, Bld: 98 mg/dL (ref 65–99)
Potassium: 3.8 mmol/L (ref 3.5–5.3)
Sodium: 139 mmol/L (ref 135–146)
Total Bilirubin: 0.5 mg/dL (ref 0.2–1.2)
Total Protein: 7.5 g/dL (ref 6.1–8.1)

## 2019-02-18 LAB — LIPID PANEL
Cholesterol: 169 mg/dL (ref <200)
HDL: 48 mg/dL — ABNORMAL LOW (ref >=50)
LDL Cholesterol (Calc): 102 mg/dL (calc) — ABNORMAL HIGH
Non-HDL Cholesterol (Calc): 121 mg/dL (calc) (ref <130)
Total CHOL/HDL Ratio: 3.5 (calc) (ref <5.0)
Triglycerides: 94 mg/dL (ref <150)

## 2019-02-18 LAB — TSH+FREE T4: TSH W/REFLEX TO FT4: 3.55 mIU/L (ref 0.40–4.50)

## 2019-02-24 ENCOUNTER — Encounter: Payer: Self-pay | Admitting: Nurse Practitioner

## 2019-02-25 ENCOUNTER — Ambulatory Visit: Payer: Medicare Other

## 2019-08-07 ENCOUNTER — Emergency Department: Payer: Medicare Other

## 2019-08-07 ENCOUNTER — Other Ambulatory Visit: Payer: Self-pay

## 2019-08-07 ENCOUNTER — Encounter: Payer: Self-pay | Admitting: Emergency Medicine

## 2019-08-07 ENCOUNTER — Observation Stay
Admission: EM | Admit: 2019-08-07 | Discharge: 2019-08-08 | Disposition: A | Discharge status: Home / Self Care | Payer: Medicare Other | Attending: Internal Medicine | Admitting: Internal Medicine

## 2019-08-07 DIAGNOSIS — T50905A Adverse effect of unspecified drugs, medicaments and biological substances, initial encounter: Secondary | ICD-10-CM | POA: Diagnosis present

## 2019-08-07 DIAGNOSIS — Z79899 Other long term (current) drug therapy: Secondary | ICD-10-CM | POA: Diagnosis not present

## 2019-08-07 DIAGNOSIS — Z20828 Contact with and (suspected) exposure to other viral communicable diseases: Secondary | ICD-10-CM | POA: Insufficient documentation

## 2019-08-07 DIAGNOSIS — I498 Other specified cardiac arrhythmias: Secondary | ICD-10-CM | POA: Diagnosis not present

## 2019-08-07 DIAGNOSIS — E876 Hypokalemia: Secondary | ICD-10-CM | POA: Diagnosis not present

## 2019-08-07 DIAGNOSIS — E785 Hyperlipidemia, unspecified: Secondary | ICD-10-CM | POA: Diagnosis not present

## 2019-08-07 DIAGNOSIS — E041 Nontoxic single thyroid nodule: Secondary | ICD-10-CM | POA: Diagnosis not present

## 2019-08-07 DIAGNOSIS — I499 Cardiac arrhythmia, unspecified: Secondary | ICD-10-CM | POA: Insufficient documentation

## 2019-08-07 DIAGNOSIS — I10 Essential (primary) hypertension: Secondary | ICD-10-CM | POA: Insufficient documentation

## 2019-08-07 DIAGNOSIS — R000 Tachycardia, unspecified: Secondary | ICD-10-CM | POA: Diagnosis not present

## 2019-08-07 DIAGNOSIS — R42 Dizziness and giddiness: Secondary | ICD-10-CM | POA: Insufficient documentation

## 2019-08-07 DIAGNOSIS — I491 Atrial premature depolarization: Secondary | ICD-10-CM | POA: Diagnosis not present

## 2019-08-07 LAB — URINALYSIS, COMPLETE (UACMP) WITH MICROSCOPIC
Bacteria, UA: NONE SEEN
Bilirubin Urine: NEGATIVE
Glucose, UA: NEGATIVE mg/dL
Hgb urine dipstick: NEGATIVE
Ketones, ur: NEGATIVE mg/dL
Nitrite: NEGATIVE
Protein, ur: NEGATIVE mg/dL
Specific Gravity, Urine: 1.009 (ref 1.005–1.030)
pH: 6 (ref 5.0–8.0)

## 2019-08-07 LAB — COMPREHENSIVE METABOLIC PANEL
ALT: 17 U/L (ref 0–44)
AST: 20 U/L (ref 15–41)
Albumin: 3.8 g/dL (ref 3.5–5.0)
Alkaline Phosphatase: 57 U/L (ref 38–126)
Anion gap: 11 (ref 5–15)
BUN: 16 mg/dL (ref 8–23)
CO2: 27 mmol/L (ref 22–32)
Calcium: 9 mg/dL (ref 8.9–10.3)
Chloride: 103 mmol/L (ref 98–111)
Creatinine, Ser: 0.76 mg/dL (ref 0.44–1.00)
GFR calc Af Amer: >60 mL/min (ref >60)
GFR calc non Af Amer: >60 mL/min (ref >60)
Glucose, Bld: 144 mg/dL — ABNORMAL HIGH (ref 70–99)
Potassium: 2.7 mmol/L — CL (ref 3.5–5.1)
Sodium: 141 mmol/L (ref 135–145)
Total Bilirubin: 0.5 mg/dL (ref 0.3–1.2)
Total Protein: 7.5 g/dL (ref 6.5–8.1)

## 2019-08-07 LAB — CBC WITH DIFFERENTIAL/PLATELET
Abs Immature Granulocytes: 0.02 10*3/uL (ref 0.00–0.07)
Basophils Absolute: 0 10*3/uL (ref 0.0–0.1)
Basophils Relative: 1 %
Eosinophils Absolute: 0.1 10*3/uL (ref 0.0–0.5)
Eosinophils Relative: 1 %
HCT: 35.1 % — ABNORMAL LOW (ref 36.0–46.0)
Hemoglobin: 11.3 g/dL — ABNORMAL LOW (ref 12.0–15.0)
Immature Granulocytes: 0 %
Lymphocytes Relative: 46 %
Lymphs Abs: 2.7 10*3/uL (ref 0.7–4.0)
MCH: 28.3 pg (ref 26.0–34.0)
MCHC: 32.2 g/dL (ref 30.0–36.0)
MCV: 88 fL (ref 80.0–100.0)
Monocytes Absolute: 0.4 10*3/uL (ref 0.1–1.0)
Monocytes Relative: 8 %
Neutro Abs: 2.6 10*3/uL (ref 1.7–7.7)
Neutrophils Relative %: 44 %
Platelets: 343 10*3/uL (ref 150–400)
RBC: 3.99 MIL/uL (ref 3.87–5.11)
RDW: 14.4 % (ref 11.5–15.5)
WBC: 5.9 10*3/uL (ref 4.0–10.5)
nRBC: 0 % (ref 0.0–0.2)

## 2019-08-07 LAB — PROTIME-INR
INR: 1 (ref 0.8–1.2)
Prothrombin Time: 12.7 seconds (ref 11.4–15.2)

## 2019-08-07 LAB — LACTIC ACID, PLASMA: Lactic Acid, Venous: 2.1 mmol/L (ref 0.5–1.9)

## 2019-08-07 LAB — LIPASE, BLOOD: Lipase: 27 U/L (ref 11–51)

## 2019-08-07 LAB — SARS CORONAVIRUS 2 BY RT PCR (HOSPITAL ORDER, PERFORMED IN ~~LOC~~ HOSPITAL LAB): SARS Coronavirus 2: NEGATIVE

## 2019-08-07 LAB — MAGNESIUM
Magnesium: 2.2 mg/dL (ref 1.7–2.4)
Magnesium: 2 mg/dL (ref 1.7–2.4)

## 2019-08-07 LAB — TROPONIN I (HIGH SENSITIVITY): Troponin I (High Sensitivity): 4 ng/L (ref <18)

## 2019-08-07 LAB — POTASSIUM: Potassium: 4.2 mmol/L (ref 3.5–5.1)

## 2019-08-07 IMAGING — CT CT HEAD W/O CM
3 series · 16 of 47 positions shown, 19 images · non-contrast
Comparison: [DATE]

CLINICAL DATA: Dizziness and weakness

EXAM:
CT HEAD WITHOUT CONTRAST
TECHNIQUE: Contiguous axial images were obtained from the base of the skull
through the vertex without intravenous contrast.

[Series 2: head wo · axial · 0.43mm/px · z∈[-147,-22]mm · 10 of 31 slices shown, 13 images]
[im 3/31  brain]
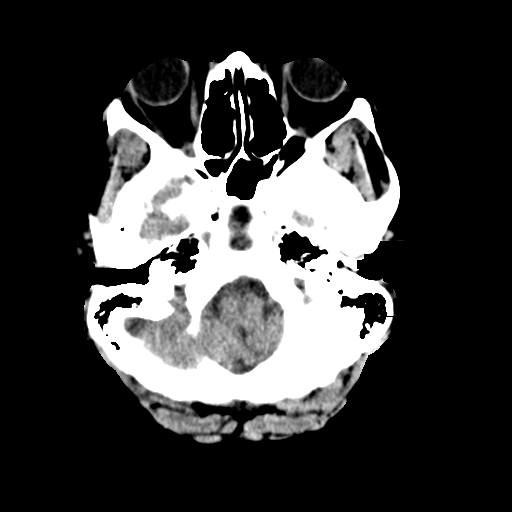
[im 3/31  bone]
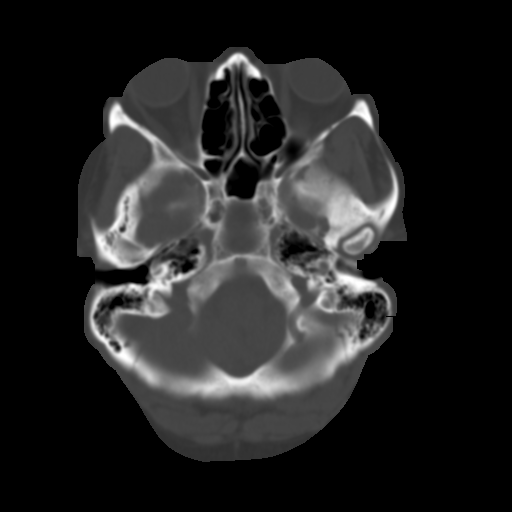
[im 6/31  brain]
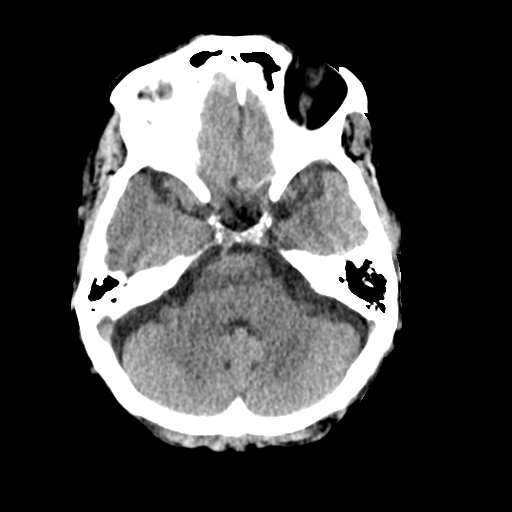
[im 9/31  brain]
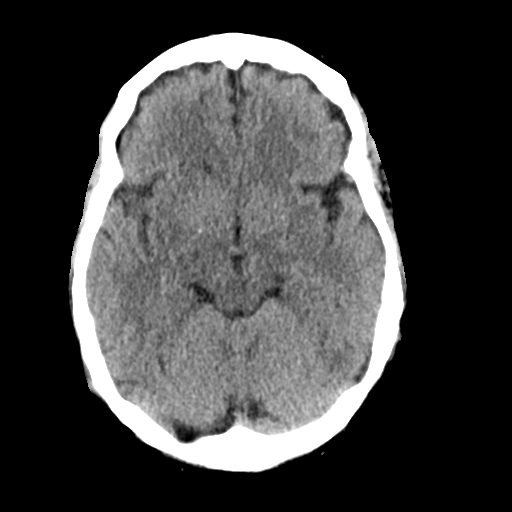
[im 11/31  brain]
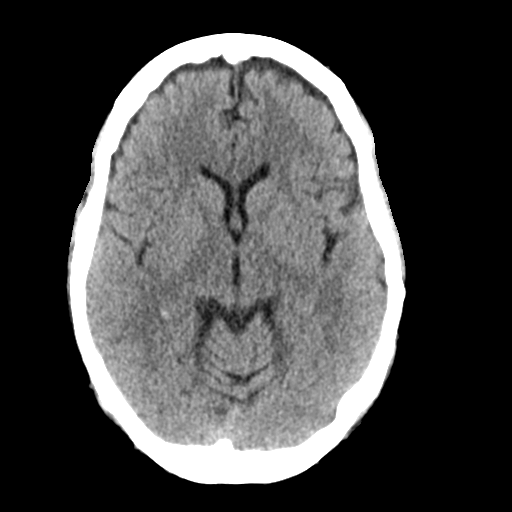
[im 14/31  brain]
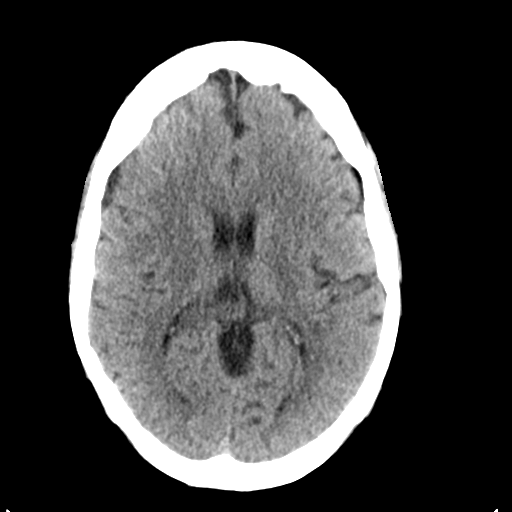
[im 14/31  bone]
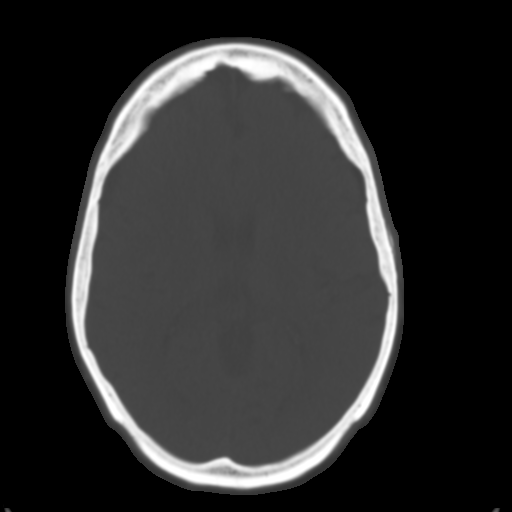
[im 17/31  brain]
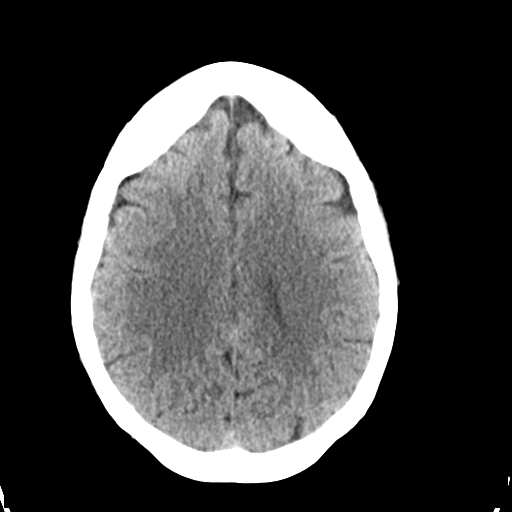
[im 20/31  brain]
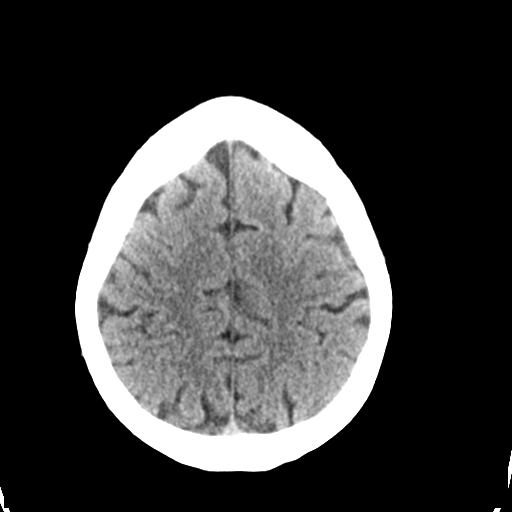
[im 23/31  brain]
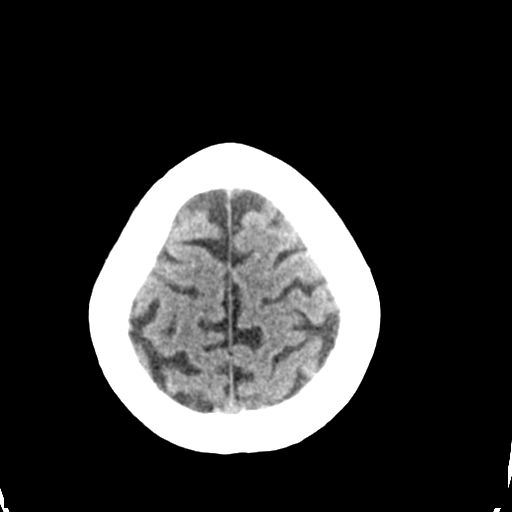
[im 25/31  brain]
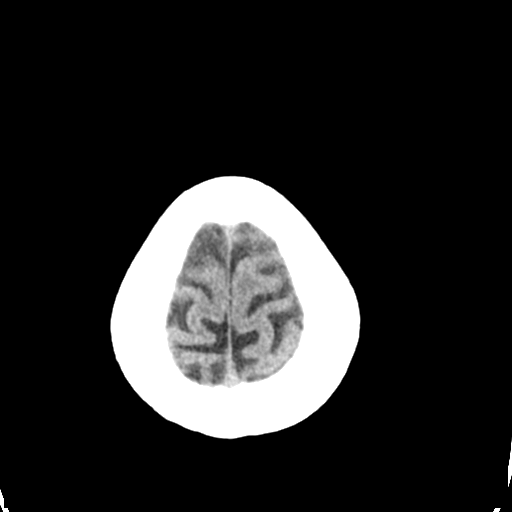
[im 25/31  bone]
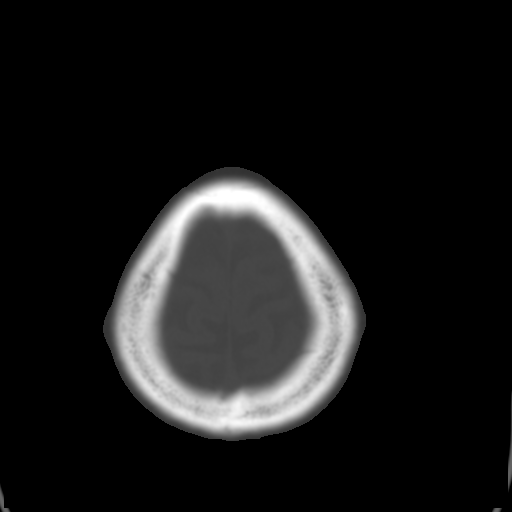
[im 28/31  brain]
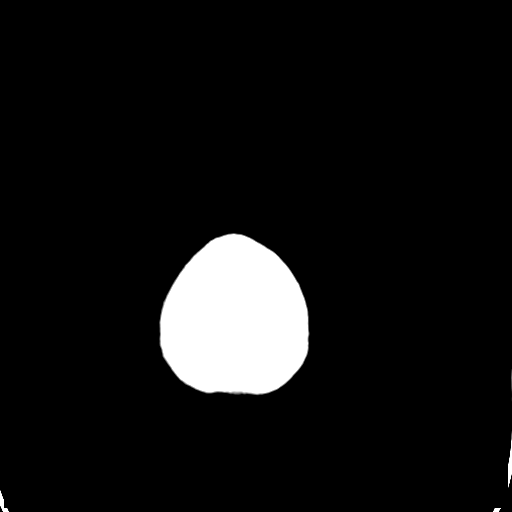

[Series 4: coronal soft tissue · coronal · 0.32mm/px · 3 of 67 slices shown]
[im 23/67  brain]
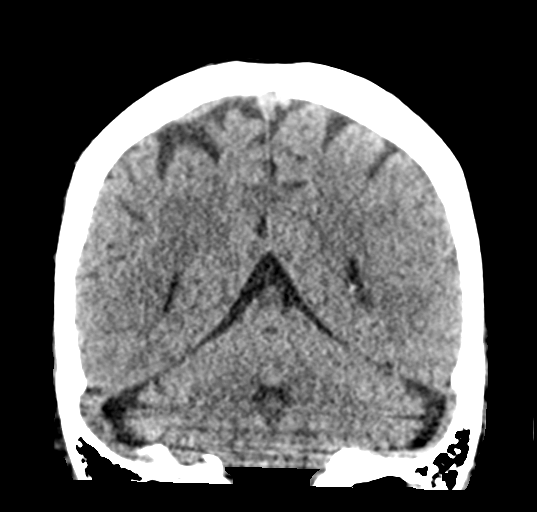
[im 30/67  brain]
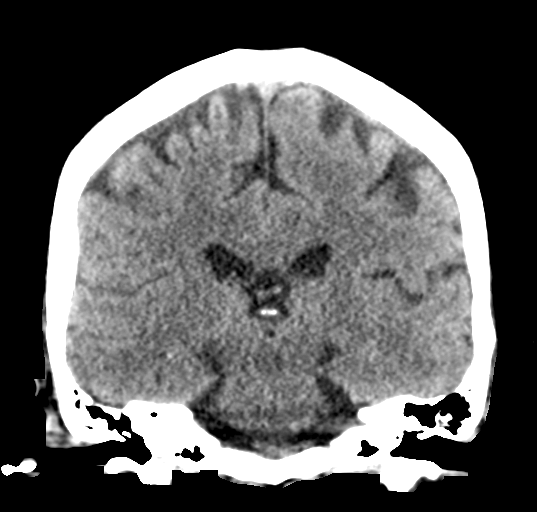
[im 37/67  brain]
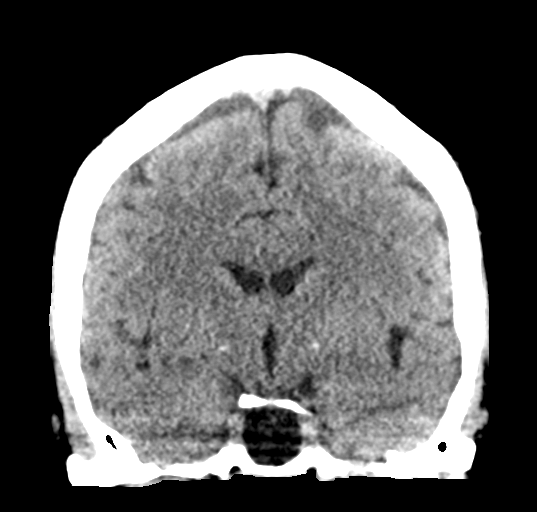

[Series 5: sagittal soft tissue · sagittal · 0.34mm/px · 3 of 53 slices shown]
[im 18/53  brain]
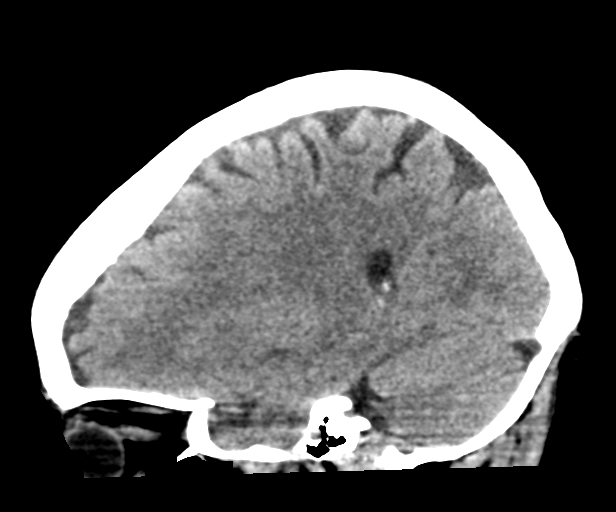
[im 27/53  brain]
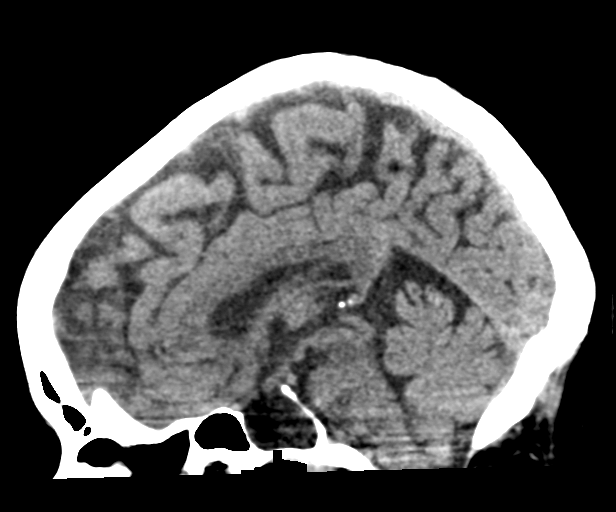
[im 35/53  brain]
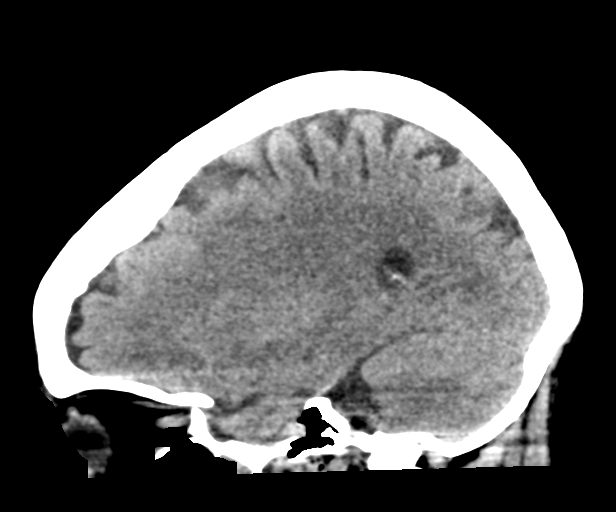

[16 of 47 positions shown; findings below may reference images not displayed]

FINDINGS: Brain: Mild atrophic changes are noted. No findings to suggest acute
hemorrhage, acute infarction or space-occupying mass lesion are
noted.

Vascular: No hyperdense vessel or unexpected calcification.

Skull: Normal. Negative for fracture or focal lesion.

Sinuses/Orbits: No acute finding.

Other: Prominent sella turcica is again noted.
IMPRESSION: Mild atrophic changes stable from the previous exam. No acute
abnormality noted.

## 2019-08-07 MED ORDER — MAGNESIUM SULFATE 2 GM/50ML IV SOLN
2.0000 g | Freq: Once | INTRAVENOUS | Status: AC
  Administered 2019-08-07: 2 g via INTRAVENOUS
  Filled 2019-08-07: qty 50

## 2019-08-07 MED ORDER — SODIUM CHLORIDE 0.9 % IV SOLN
Freq: Once | INTRAVENOUS | Status: AC
  Administered 2019-08-07: 08:00:00 via INTRAVENOUS

## 2019-08-07 MED ORDER — DIAZEPAM 5 MG PO TABS
5.0000 mg | ORAL_TABLET | Freq: Once | ORAL | Status: AC
  Administered 2019-08-07: 5 mg via ORAL
  Filled 2019-08-07: qty 1

## 2019-08-07 MED ORDER — KCL IN DEXTROSE-NACL 40-5-0.45 MEQ/L-%-% IV SOLN
INTRAVENOUS | Status: DC
  Administered 2019-08-07 (×2): via INTRAVENOUS
  Filled 2019-08-07 (×6): qty 1000

## 2019-08-07 MED ORDER — ENOXAPARIN SODIUM 40 MG/0.4ML ~~LOC~~ SOLN: 40.0000 mg | SUBCUTANEOUS | Status: DC

## 2019-08-07 MED ORDER — HYDRALAZINE HCL 20 MG/ML IJ SOLN: 10.0000 mg | Freq: Four times a day (QID) | INTRAMUSCULAR | Status: DC | PRN

## 2019-08-07 MED ORDER — ONDANSETRON HCL 4 MG PO TABS: 4.0000 mg | ORAL_TABLET | Freq: Four times a day (QID) | ORAL | Status: DC | PRN

## 2019-08-07 MED ORDER — ACETAMINOPHEN 325 MG PO TABS
650.0000 mg | ORAL_TABLET | Freq: Four times a day (QID) | ORAL | Status: DC | PRN
  Administered 2019-08-07: 650 mg via ORAL
  Filled 2019-08-07: qty 2

## 2019-08-07 MED ORDER — ONDANSETRON HCL 4 MG/2ML IJ SOLN: 4.0000 mg | Freq: Four times a day (QID) | INTRAMUSCULAR | Status: DC | PRN

## 2019-08-07 MED ORDER — SODIUM CHLORIDE 0.9 % IV SOLN: INTRAVENOUS | Status: DC

## 2019-08-07 MED ORDER — MECLIZINE HCL 25 MG PO TABS
50.0000 mg | ORAL_TABLET | Freq: Once | ORAL | Status: AC
  Administered 2019-08-07: 50 mg via ORAL
  Filled 2019-08-07: qty 2

## 2019-08-07 MED ORDER — VITAMIN B-12 1000 MCG PO TABS
1000.0000 ug | ORAL_TABLET | Freq: Every day | ORAL | Status: DC
  Administered 2019-08-08: 1000 ug via ORAL
  Filled 2019-08-07 (×2): qty 1

## 2019-08-07 MED ORDER — ENOXAPARIN SODIUM 40 MG/0.4ML ~~LOC~~ SOLN
40.0000 mg | Freq: Two times a day (BID) | SUBCUTANEOUS | Status: DC
  Administered 2019-08-07 – 2019-08-08 (×2): 40 mg via SUBCUTANEOUS
  Filled 2019-08-07 (×2): qty 0.4

## 2019-08-07 MED ORDER — ONDANSETRON HCL 4 MG/2ML IJ SOLN
4.0000 mg | Freq: Once | INTRAMUSCULAR | Status: AC
  Administered 2019-08-07: 4 mg via INTRAVENOUS
  Filled 2019-08-07: qty 2

## 2019-08-07 MED ORDER — POTASSIUM CHLORIDE 10 MEQ/100ML IV SOLN
10.0000 meq | INTRAVENOUS | Status: AC
  Administered 2019-08-07 – 2019-08-08 (×4): 10 meq via INTRAVENOUS
  Filled 2019-08-07 (×4): qty 100

## 2019-08-07 MED ORDER — SIMVASTATIN 20 MG PO TABS
40.0000 mg | ORAL_TABLET | Freq: Every day | ORAL | Status: DC
  Administered 2019-08-07: 40 mg via ORAL
  Filled 2019-08-07: qty 2

## 2019-08-07 MED ORDER — ADULT MULTIVITAMIN W/MINERALS CH
2.0000 | ORAL_TABLET | Freq: Every day | ORAL | Status: DC
  Administered 2019-08-07 – 2019-08-08 (×2): 2 via ORAL
  Filled 2019-08-07 (×2): qty 2

## 2019-08-07 NOTE — ED Notes (Signed)
Pt up to the toilet with assistance, pt begins having dry heaves .

## 2019-08-07 NOTE — ED Notes (Signed)
Attempted to call report without success

## 2019-08-07 NOTE — H&P (Signed)
[facility] [place] NAME: Julie James    MR#:  [id number]  DATE OF BIRTH:  [birth year]  DATE OF ADMISSION:  08/07/2019  PRIMARY CARE PHYSICIAN: Mikey College, NP   REQUESTING/REFERRING PHYSICIAN: Lenise Arena, MD  CHIEF COMPLAINT:   Chief Complaint  Patient presents with  . Weakness    HISTORY OF PRESENT ILLNESS:   68 year old female with past medical history of hypertension, hyperlipidemia, and thyroid nodule presenting to the ED with chief complaints of generalized weakness, dizziness and nausea.  Patient report onset of symptoms since last night. Describes dizziness as a spinning sensation worse with both lying down and sitting up with associated symptoms of nausea without vomiting. Denies associated symptoms of tinnitus, hearing loss, ear fullness, otalgia, altered sensorium, speech abnormality, cranial nerve deficit,seizures, focal motor or sensory deficits, diplopia, or vomiting, syncope or LOC, paresthesia (numbness, tingling, pins-and-needles sensation) or a heavy feeling in an extremity.  Denies fevers, chills, chest pain, or shortness of breath.  On arrival to the ED, she was afebrile with blood pressure 141/68 mm Hg and pulse rate 90 beats/min. There were no focal neurological deficits; she was alert and oriented x 4.  Per EMS report, patient had a run of V. tach for which she was given IV amiodarone 150 mg, 4 mg of Zofran and 250 mL's normal saline on route.  Initial labs revealed lactic 2.1, potassium 2.7, COVID-19 negative, CBC unremarkable.  UA shows small leuks. ECG showed sinus rhythm of 92 beats per minute, ventricular bigeminy, normal axis, and normal QT.Marland Kitchen A non-contrast head CT showed no acute intracranial abnormality.  PAST MEDICAL HISTORY:   Past Medical History:  Diagnosis Date  . Hypertension    PAST SURGICAL HISTORY:  History reviewed. No pertinent surgical history.  SOCIAL HISTORY:    Social History   Tobacco Use  . Smoking status: Never Smoker  . Smokeless tobacco: Never Used  Substance Use Topics  . Alcohol use: No    FAMILY HISTORY:   Family History  Problem Relation Age of Onset  . Cancer Sister        breast  . Cancer Sister        breast    DRUG ALLERGIES:  No Known Allergies  REVIEW OF SYSTEMS:   Review of Systems  Constitutional: Negative for chills, fever, malaise/fatigue and weight loss.  HENT: Negative for congestion, hearing loss and sore throat.   Eyes: Negative for blurred vision and double vision.  Respiratory: Negative for cough, shortness of breath and wheezing.   Cardiovascular: Negative for chest pain, palpitations, orthopnea and leg swelling.  Gastrointestinal: Positive for nausea. Negative for abdominal pain, diarrhea and vomiting.  Genitourinary: Negative for dysuria and urgency.  Musculoskeletal: Negative for myalgias.  Skin: Negative for rash.  Neurological: Positive for dizziness and weakness. Negative for sensory change, speech change, focal weakness and headaches.  Psychiatric/Behavioral: Negative for depression.   MEDICATIONS AT HOME:   Prior to Admission medications   Medication Sig Start Date End Date Taking? Authorizing Provider  chlorthalidone (HYGROTON) 25 MG tablet Take 1 tablet (25 mg total) by mouth daily. 02/17/19  Yes Mikey College, NP  simvastatin (ZOCOR) 40 MG tablet Take 1 tablet (40 mg total) by mouth at bedtime. 02/17/19  Yes Mikey College, NP  CALCIUM-MAGNESIUM-ZINC PO Take 3 tablets by mouth daily.    [provider]  cyanocobalamin 100 MCG tablet Take 1,000 mcg by mouth daily.    [provider]  Multiple Vitamin (MULTIVITAMIN) tablet Take 2 tablets by mouth daily.    [provider]      VITAL SIGNS:  Blood pressure 129/66, pulse 69, temperature 97.9 F (36.6 C), temperature source Oral, resp. rate 16, height 5\' 5"  (1.651 m), weight 108.9 kg, SpO2 98 %.   PHYSICAL EXAMINATION:   Physical Exam  GENERAL:  68 y.o.-year-old patient lying in the bed with no acute distress.  EYES: Pupils equal, round, reactive to light and accommodation. No scleral icterus. Extraocular muscles intact.  HEENT: Head atraumatic, normocephalic. Oropharynx and nasopharynx clear.  NECK:  Supple, no jugular venous distention. No thyroid enlargement, no tenderness.  LUNGS: Normal breath sounds bilaterally, no wheezing, rales,rhonchi or crepitation. No use of accessory muscles of respiration.  CARDIOVASCULAR: S1, S2 normal. No murmurs, rubs, or gallops.  ABDOMEN: Soft, nontender, nondistended. Bowel sounds present. No organomegaly or mass.  EXTREMITIES: No pedal edema, cyanosis, or clubbing. No rash or lesions. + pedal pulses MUSCULOSKELETAL: Normal bulk, and power was 5+ grip and elbow, knee, and ankle flexion and extension bilaterally.  NEUROLOGIC:Alert and oriented x 3. CN 2-12 intact. No nystagmus appreciated.  Sensation to light touch and cold stimuli intact bilaterally. . Babinski is downgoing. DTR's (biceps, patellar, and achilles) 2+ and symmetric throughout. Gait not tested due to safety concern. PSYCHIATRIC: The patient is alert and oriented x 3.  SKIN: No obvious rash, lesion, or ulcer.   DATA REVIEWED:  LABORATORY PANEL:   CBC Recent Labs  Lab 08/07/19 0654  WBC 5.9  HGB 11.3*  HCT 35.1*  PLT 343   ------------------------------------------------------------------------------------------------------------------  Chemistries  Recent Labs  Lab 08/07/19 0654  NA 141  K 2.7*  CL 103  CO2 27  GLUCOSE 144*  BUN 16  CREATININE 0.76  CALCIUM 9.0  MG 2.2  AST 20  ALT 17  ALKPHOS 57  BILITOT 0.5   ------------------------------------------------------------------------------------------------------------------  Cardiac Enzymes No results for input(s): TROPONINI in the last 168 hours.  ------------------------------------------------------------------------------------------------------------------  RADIOLOGY:  Ct Head Wo Contrast  Result Date: 08/07/2019 CLINICAL DATA:  Dizziness and weakness EXAM: CT HEAD WITHOUT CONTRAST TECHNIQUE: Contiguous axial images were obtained from the base of the skull through the vertex without intravenous contrast. COMPARISON:  02/20/2014 FINDINGS: Brain: Mild atrophic changes are noted. No findings to suggest acute hemorrhage, acute infarction or space-occupying mass lesion are noted. Vascular: No hyperdense vessel or unexpected calcification. Skull: Normal. Negative for fracture or focal lesion. Sinuses/Orbits: No acute finding. Other: Prominent sella turcica is again noted. IMPRESSION: Mild atrophic changes stable from the previous exam. No acute abnormality noted. Electronically Signed   By: Inez Catalina M.D.   On: 08/07/2019 08:38    EKG:  EKG: normal EKG, normal sinus rhythm, unchanged from previous tracings, ventricular bigeminy..  IMPRESSION AND PLAN:   68 y.o. female with past medical history of hypertension, hyperlipidemia, and thyroid nodule presenting to the ED with chief complaints of generalized weakness, dizziness and nausea.  1. Acute onset dizziness -no focal neurologic deficit to suggest ischemic event.  Arrhythmias noted on ECG may be likely contributing. - Admit to telemetry unit - CT head reviewed and shows no acute intracranial abnormality -Treat with meclizine - PRN antiemetics  2. Hypokalemia -likely drug induced in a patient on chlorthalidone - Patient started on D5 half-normal saline with 40 mEq of potassium in the ED - Stop chlorthalidone - Check magnesium - Continue potassium supplement - Monitor serial BMP + mag  3. HTN  + Goal BP <  130/80 - Stop chlorthalidone in the setting of hypokalemia - PRN IV hydralazine   4.Thyroid nodule - Patient is being worked up for this by PCP  5. DVT prophylaxis -  Enoxaparin / Heparin SubQ if poor mobility and stay >24hrs  All the records are reviewed and case discussed with ED provider. Management plans discussed with the patient, family and they are in agreement.  CODE STATUS: FULL  TOTAL TIME TAKING CARE OF THIS PATIENT: 50 minutes.    on 08/07/2019 at 9:33 AM  Rufina Falco, DNP, FNP-BC [facility] Nurse Practitioner Between 7am to 6pm - Pager [phone]  After 6pm go to [email or web address] - password [email or web address]  Office  [phone]  CC: Primary care physician; Mikey College, NP

## 2019-08-07 NOTE — ED Notes (Signed)
ED TO INPATIENT HANDOFF REPORT  ED Nurse Name and Phone #: Larene Beach [phone]   S Name/Age/Gender Julie James 68 y.o. female Room/Bed: ED07A/ED07A  Code Status   Code Status: Full Code  Home/SNF/Other Home Patient oriented to: self, place, time and situation Is this baseline? Yes   Triage Complete: Triage complete  Chief Complaint weakness  Triage Note Pt presents from home via acems with c/o weakness, nausea that has been going on since yesterday. Pt had 5 lead run of v-tach for ems before arrival to hospital. 4mg  of zofran given in route via acems. 255ml normal saline. 150 mg amiodarone given by ems. Pt currently alert and oriented x4.    Allergies No Known Allergies  Level of Care/Admitting Diagnosis ED Disposition    ED Disposition Condition [facility] Area: [place] [[id number]]  Level of Care: Telemetry [5]  Covid Evaluation: Confirmed COVID Negative  Diagnosis: Drug-induced hypokalemia [id number]  Admitting Physician: Hyman Bible DODD [id number]  Attending Physician: Rufina Falco ACHIENG [id number]  Estimated length of stay: past midnight tomorrow  Certification:: I certify this patient will need inpatient services for at least 2 midnights  PT Class (Do Not Modify): Inpatient [101]  PT Acc Code (Do Not Modify): Private [1]       B Medical/Surgery History Past Medical History:  Diagnosis Date  . Hypertension    History reviewed. No pertinent surgical history.   A IV Location/Drains/Wounds Patient Lines/Drains/Airways Status   Active Line/Drains/Airways    Name:   Placement date:   Placement time:   Site:   Days:   Peripheral IV 08/07/19 Right Antecubital   08/07/19    0654    Antecubital   less than 1          Intake/Output Last 24 hours No intake or output data in the 24 hours ending 08/07/19 [id number]  Labs/Imaging Results for orders placed or performed during the hospital encounter of 08/07/19 (from the  past 48 hour(s))  SARS Coronavirus 2 [facility] order, Performed in [facility] hospital lab) Nasopharyngeal Nasopharyngeal Swab     Status: None   Collection Time: 08/07/19  6:46 AM   Specimen: Nasopharyngeal Swab  Result Value Ref Range   SARS Coronavirus 2 NEGATIVE NEGATIVE    Comment: (NOTE) If result is NEGATIVE SARS-CoV-2 target nucleic acids are NOT DETECTED. The SARS-CoV-2 RNA is generally detectable in upper and lower  respiratory specimens during the acute phase of infection. The lowest  concentration of SARS-CoV-2 viral copies this assay can detect is 250  copies / mL. A negative result does not preclude SARS-CoV-2 infection  and should not be used as the sole basis for treatment or other  patient management decisions.  A negative result may occur with  improper specimen collection / handling, submission of specimen other  than nasopharyngeal swab, presence of viral mutation(s) within the  areas targeted by this assay, and inadequate number of viral copies  (<250 copies / mL). A negative result must be combined with clinical  observations, patient history, and epidemiological information. If result is POSITIVE SARS-CoV-2 target nucleic acids are DETECTED. The SARS-CoV-2 RNA is generally detectable in upper and lower  respiratory specimens dur ing the acute phase of infection.  Positive  results are indicative of active infection with SARS-CoV-2.  Clinical  correlation with patient history and other diagnostic information is  necessary to determine patient infection status.  Positive results do  not rule out bacterial infection or co-infection with  other viruses. If result is PRESUMPTIVE POSTIVE SARS-CoV-2 nucleic acids MAY BE PRESENT.   A presumptive positive result was obtained on the submitted specimen  and confirmed on repeat testing.  While 2019 novel coronavirus  (SARS-CoV-2) nucleic acids may be present in the submitted sample  additional confirmatory testing may be  necessary for epidemiological  and / or clinical management purposes  to differentiate between  SARS-CoV-2 and other Sarbecovirus currently known to infect humans.  If clinically indicated additional testing with an alternate test  methodology [phone]) is advised. The SARS-CoV-2 RNA is generally  detectable in upper and lower respiratory sp ecimens during the acute  phase of infection. The expected result is Negative. Fact Sheet for Patients:  StrictlyIdeas.no Fact Sheet for Healthcare Providers: BankingDealers.co.za This test is not yet approved or cleared by the [place] FDA and has been authorized for detection and/or diagnosis of SARS-CoV-2 by FDA under an Emergency Use Authorization (EUA).  This EUA will remain in effect (meaning this test can be used) for the duration of the COVID-19 declaration under Section 564(b)(1) of the Act, 21 U.S.C. section 360bbb-3(b)(1), unless the authorization is terminated or revoked sooner. Performed at [facility], [address] [place], [place] [address]   Lactic acid, plasma     Status: Abnormal   Collection Time: 08/07/19  6:48 AM  Result Value Ref Range   Lactic Acid, Venous 2.1 (HH) 0.5 - 1.9 mmol/L    Comment: CRITICAL RESULT CALLED TO, READ BACK BY AND VERIFIED WITH Charlee Squibb ON 08/07/2019 AT 0725 TIK Performed at [facility], [address] [place], [place] [address]   Magnesium     Status: None   Collection Time: 08/07/19  6:54 AM  Result Value Ref Range   Magnesium 2.2 1.7 - 2.4 mg/dL    Comment: Performed at [facility], [address] [place], [place] [address]  Comprehensive metabolic panel     Status: Abnormal   Collection Time: 08/07/19  6:54 AM  Result Value Ref Range   Sodium 141 135 - 145 mmol/L   Potassium 2.7 (LL) 3.5 - 5.1 mmol/L    Comment: CRITICAL RESULT CALLED TO, READ BACK BY AND VERIFIED WITH ANNA HOLT AT 0736 08/07/2019  DAS    Chloride 103 98 - 111 mmol/L   CO2 27 22 - 32 mmol/L   Glucose, Bld 144 (H) 70 - 99 mg/dL   BUN 16 8 - 23 mg/dL   Creatinine, Ser 0.76 0.44 - 1.00 mg/dL   Calcium 9.0 8.9 - 10.3 mg/dL   Total Protein 7.5 6.5 - 8.1 g/dL   Albumin 3.8 3.5 - 5.0 g/dL   AST 20 15 - 41 U/L   ALT 17 0 - 44 U/L   Alkaline Phosphatase 57 38 - 126 U/L   Total Bilirubin 0.5 0.3 - 1.2 mg/dL   GFR calc non Af Amer >60 >60 mL/min   GFR calc Af Amer >60 >60 mL/min   Anion gap 11 5 - 15    Comment: Performed at [facility], [address] [place], [place] [address]  Lipase, blood     Status: None   Collection Time: 08/07/19  6:54 AM  Result Value Ref Range   Lipase 27 11 - 51 U/L    Comment: Performed at [facility], [address] [place], [place] [address]  Protime-INR     Status: None   Collection Time: 08/07/19  6:54 AM  Result Value Ref Range  Prothrombin Time 12.7 11.4 - 15.2 seconds   INR 1.0 0.8 - 1.2    Comment: (NOTE) INR goal varies based on device and disease states. Performed at [facility], [address] [place], [place] [address]   CBC with Differential/Platelet     Status: Abnormal   Collection Time: 08/07/19  6:54 AM  Result Value Ref Range   WBC 5.9 4.0 - 10.5 K/uL   RBC 3.99 3.87 - 5.11 MIL/uL   Hemoglobin 11.3 (L) 12.0 - 15.0 g/dL   HCT 35.1 (L) 36.0 - 46.0 %   MCV 88.0 80.0 - 100.0 fL   MCH 28.3 26.0 - 34.0 pg   MCHC 32.2 30.0 - 36.0 g/dL   RDW 14.4 11.5 - 15.5 %   Platelets 343 150 - 400 K/uL   nRBC 0.0 0.0 - 0.2 %   Neutrophils Relative % 44 %   Neutro Abs 2.6 1.7 - 7.7 K/uL   Lymphocytes Relative 46 %   Lymphs Abs 2.7 0.7 - 4.0 K/uL   Monocytes Relative 8 %   Monocytes Absolute 0.4 0.1 - 1.0 K/uL   Eosinophils Relative 1 %   Eosinophils Absolute 0.1 0.0 - 0.5 K/uL   Basophils Relative 1 %   Basophils Absolute 0.0 0.0 - 0.1 K/uL   Immature Granulocytes 0 %   Abs Immature Granulocytes 0.02 0.00 - 0.07 K/uL    Comment:  Performed at [facility], [place], [place] [address]  Troponin I (High Sensitivity)     Status: None   Collection Time: 08/07/19  6:54 AM  Result Value Ref Range   Troponin I (High Sensitivity) 4 <18 ng/L    Comment: (NOTE) Elevated high sensitivity troponin I (hsTnI) values and significant  changes across serial measurements may suggest ACS but many other  chronic and acute conditions are known to elevate hsTnI results.  Refer to the "Links" section for chest pain algorithms and additional  guidance. Performed at [facility], [address] [place], [place] [address]   Urinalysis, Complete w Microscopic     Status: Abnormal   Collection Time: 08/07/19  8:44 AM  Result Value Ref Range   Color, Urine STRAW (A) YELLOW   APPearance CLEAR (A) CLEAR   Specific Gravity, Urine 1.009 1.005 - 1.030   pH 6.0 5.0 - 8.0   Glucose, UA NEGATIVE NEGATIVE mg/dL   Hgb urine dipstick NEGATIVE NEGATIVE   Bilirubin Urine NEGATIVE NEGATIVE   Ketones, ur NEGATIVE NEGATIVE mg/dL   Protein, ur NEGATIVE NEGATIVE mg/dL   Nitrite NEGATIVE NEGATIVE   Leukocytes,Ua SMALL (A) NEGATIVE   RBC / HPF 0-5 0 - 5 RBC/hpf   WBC, UA 0-5 0 - 5 WBC/hpf   Bacteria, UA NONE SEEN NONE SEEN   Squamous Epithelial / LPF 0-5 0 - 5   Mucus PRESENT    Hyaline Casts, UA PRESENT     Comment: Performed at [facility], [address] [place], [place] [address]   Ct Head Wo Contrast  Result Date: 08/07/2019 CLINICAL DATA:  Dizziness and weakness EXAM: CT HEAD WITHOUT CONTRAST TECHNIQUE: Contiguous axial images were obtained from the base of the skull through the vertex without intravenous contrast. COMPARISON:  02/20/2014 FINDINGS: Brain: Mild atrophic changes are noted. No findings to suggest acute hemorrhage, acute infarction or space-occupying mass lesion are noted. Vascular: No hyperdense vessel or unexpected calcification. Skull: Normal. Negative for fracture or focal lesion.  Sinuses/Orbits: No acute finding. Other: Prominent sella  turcica is again noted. IMPRESSION: Mild atrophic changes stable from the previous exam. No acute abnormality noted. Electronically Signed   By: Inez Catalina M.D.   On: 08/07/2019 08:38    Pending Labs Unresulted Labs (From admission, onward)    Start     Ordered   08/08/19 [id number]  Basic metabolic panel  Tomorrow morning,   STAT     08/07/19 0931   08/07/19 0932  Magnesium  Add-on,   AD     08/07/19 0931   08/07/19 0929  HIV antibody (Routine Testing)  Once,   STAT     08/07/19 0931          Vitals/Pain Today's Vitals   08/07/19 0730 08/07/19 0800 08/07/19 0830 08/07/19 0900  BP: 130/68 117/61 129/66   Pulse: 68 75 82 69  Resp: 15 17 15 16   Temp:      TempSrc:      SpO2: 99% 99% 100% 98%  Weight:      Height:      PainSc:        Isolation Precautions No active isolations  Medications Medications  dextrose 5 % and 0.45 % NaCl with KCl 40 mEq/L infusion ( Intravenous New Bag/Given 08/07/19 0836)  simvastatin (ZOCOR) tablet 40 mg (has no administration in time range)  vitamin B-12 (CYANOCOBALAMIN) tablet 1,000 mcg (has no administration in time range)  multivitamin tablet 2 tablet (has no administration in time range)  enoxaparin (LOVENOX) injection 40 mg (has no administration in time range)  0.9 %  sodium chloride infusion (has no administration in time range)  ondansetron (ZOFRAN) tablet 4 mg (has no administration in time range)    Or  ondansetron (ZOFRAN) injection 4 mg (has no administration in time range)  0.9 %  sodium chloride infusion ( Intravenous New Bag/Given 08/07/19 0740)  ondansetron (ZOFRAN) injection 4 mg (4 mg Intravenous Given 08/07/19 0740)  meclizine (ANTIVERT) tablet 50 mg (50 mg Oral Given 08/07/19 0735)  diazepam (VALIUM) tablet 5 mg (5 mg Oral Given 08/07/19 0735)    Mobility walks Moderate fall risk   Focused Assessments Cardiac Assessment Handoff:  Cardiac Rhythm: Normal sinus rhythm Lab  Results  Component Value Date   TROPONINI < 0.02 02/20/2014   No results found for: DDIMER Does the Patient currently have chest pain? No     R Recommendations: See Admitting Provider Note  Report given to:   Additional Notes:

## 2019-08-07 NOTE — ED Provider Notes (Signed)
[facility] Emergency Department Provider Note       Time seen: ----------------------------------------- 7:17 AM on 08/07/2019 -----------------------------------------   I have reviewed the triage vital signs and the nursing notes.  HISTORY   Chief Complaint Weakness   HPI Julie James is a 68 y.o. female with a history of hypertension, hyperlipidemia who presents to the ED for weakness, nausea.  This is been going on since yesterday.  According to EMS she had a run of V. tach for which she was given IV amiodarone.  She was also given Zofran.  She describes room spinning sensation is worse when she stands up, has never had this happen before.  Denies any recent illness.  Past Medical History:  Diagnosis Date  . Hypertension     Patient Active Problem List   Diagnosis Date Noted  . Lipoma of abdomen 07/30/2017  . Lipoma of right thigh 07/30/2017  . Hypertension 06/23/2016  . Hyperlipidemia 06/23/2016  . Thyroid nodule 03/16/2014    History reviewed. No pertinent surgical history.  Allergies Patient has no known allergies.  Social History Social History   Tobacco Use  . Smoking status: Never Smoker  . Smokeless tobacco: Never Used  Substance Use Topics  . Alcohol use: No  . Drug use: No   Review of Systems Constitutional: Negative for fever. Cardiovascular: Negative for chest pain. Respiratory: Negative for shortness of breath. Gastrointestinal: Negative for abdominal pain, positive for nausea and vomiting Musculoskeletal: Negative for back pain. Skin: Negative for rash. Neurological: Negative for headaches, focal weakness or numbness.  Positive for vertigo  All systems negative/normal/unremarkable except as stated in the HPI  ____________________________________________   PHYSICAL EXAM:  VITAL SIGNS: ED Triage Vitals  Enc Vitals Group     BP 08/07/19 0657 (!) 141/68     Pulse Rate 08/07/19 0657 82     Resp 08/07/19  0657 17     Temp 08/07/19 0657 97.9 F (36.6 C)     Temp Source 08/07/19 0657 Oral     SpO2 08/07/19 0657 98 %     Weight 08/07/19 0645 240 lb (108.9 kg)     Height 08/07/19 0645 5\' 5"  (1.651 m)     Head Circumference --      Peak Flow --      Pain Score 08/07/19 0645 0     Pain Loc --      Pain Edu? --      Excl. in [place]? --    Constitutional: Alert and oriented. Well appearing and in no distress. Eyes: Conjunctivae are normal. Normal extraocular movements. ENT      Head: Normocephalic and atraumatic.      Nose: No congestion/rhinnorhea.      Mouth/Throat: Mucous membranes are moist.      Neck: No stridor. Cardiovascular: Normal rate, regular rhythm. No murmurs, rubs, or gallops. Respiratory: Normal respiratory effort without tachypnea nor retractions. Breath sounds are clear and equal bilaterally. No wheezes/rales/rhonchi. Gastrointestinal: Soft and nontender. Normal bowel sounds Musculoskeletal: Nontender with normal range of motion in extremities. No lower extremity tenderness nor edema. Neurologic:  Normal speech and language. No gross focal neurologic deficits are appreciated.  Skin:  Skin is warm, dry and intact. No rash noted. Psychiatric: Mood and affect are normal. Speech and behavior are normal.  ____________________________________________  EKG: Interpreted by me.  Sinus rhythm with rate of 92 bpm, ventricular bigeminy, normal axis, normal QT  ____________________________________________  ED COURSE:  As part of my medical decision making,  I reviewed the following data within the [place] History obtained from family if available, nursing notes, old chart and ekg, as well as notes from prior ED visits. Patient presented for vertigo, we will assess with labs and imaging as indicated at this time.   Procedures  Julie James was evaluated in Emergency Department on 08/07/2019 for the symptoms described in the history of present illness. She was  evaluated in the context of the global COVID-19 pandemic, which necessitated consideration that the patient might be at risk for infection with the SARS-CoV-2 virus that causes COVID-19. Institutional protocols and algorithms that pertain to the evaluation of patients at risk for COVID-19 are in a state of rapid change based on information released by regulatory bodies including the CDC and federal and state organizations. These policies and algorithms were followed during the patient's care in the ED.  ____________________________________________   LABS (pertinent positives/negatives)  Labs Reviewed  COMPREHENSIVE METABOLIC PANEL - Abnormal; Notable for the following components:      Result Value   Potassium 2.7 (*)    Glucose, Bld 144 (*)    All other components within normal limits  CBC WITH DIFFERENTIAL/PLATELET - Abnormal; Notable for the following components:   Hemoglobin 11.3 (*)    HCT 35.1 (*)    All other components within normal limits  LACTIC ACID, PLASMA - Abnormal; Notable for the following components:   Lactic Acid, Venous 2.1 (*)    All other components within normal limits  SARS CORONAVIRUS 2 (HOSPITAL ORDER, [place] LAB)  MAGNESIUM  LIPASE, BLOOD  PROTIME-INR  URINALYSIS, COMPLETE (UACMP) WITH MICROSCOPIC  TROPONIN I (HIGH SENSITIVITY)    RADIOLOGY Images were viewed by me  CT head Was unremarkable ____________________________________________   DIFFERENTIAL DIAGNOSIS   Peripheral vertigo, central vertigo, arrhythmia, electrolyte abnormality, dehydration  FINAL ASSESSMENT AND PLAN  Vertigo, arrhythmia, hypokalemia   Plan: The patient had presented for symptoms of vertigo as well as irregular heart rhythm. Patient's labs did indicate significant hypokalemia with a potassium of 2.7.  She was started on D5 half-normal saline with 40 mEq of potassium at 250 an hour. Patient's imaging not reveal any acute process.  She remains in and out  of bigeminy but has not had any further ventricular tachycardia.  I will hold off on giving her any amiodarone at this time.  I will discuss with the hospitalist for admission   Laurence Aly, MD    Note: This note was generated in part or whole with voice recognition software. Voice recognition is usually quite accurate but there are transcription errors that can and very often do occur. I apologize for any typographical errors that were not detected and corrected.     Earleen Newport, MD 08/07/19 [phone]

## 2019-08-07 NOTE — ED Notes (Signed)
MD at bedside to update pt and pts daughter.

## 2019-08-07 NOTE — ED Notes (Signed)
Patient transported to CT 

## 2019-08-07 NOTE — Progress Notes (Signed)
PHARMACIST - PHYSICIAN COMMUNICATION  CONCERNING:  Enoxaparin (Lovenox) for DVT Prophylaxis    RECOMMENDATION: Patient was prescribed enoxaprin 40mg  q24 hours for VTE prophylaxis.   Filed Weights   08/07/19 0645 08/07/19 1046  Weight: 240 lb (108.9 kg) 249 lb 9.6 oz (113.2 kg)    Body mass index is 41.54 kg/m.  Estimated Creatinine Clearance: 86.8 mL/min (by C-G formula based on SCr of 0.76 mg/dL).   Based on [place] patient is candidate for enoxaparin 40mg  every 12 hour dosing due to BMI being >40.   DESCRIPTION: Pharmacy has adjusted enoxaparin dose per [facility] policy.  Patient is now receiving enoxaparin 40mg  every 12 hours.    Rowland Lathe, PharmD Clinical Pharmacist  08/07/2019 2:31 PM

## 2019-08-07 NOTE — ED Triage Notes (Signed)
Pt presents from home via acems with c/o weakness, nausea that has been going on since yesterday. Pt had 5 lead run of v-tach for ems before arrival to hospital. 4mg  of zofran given in route via acems. 232ml normal saline. 150 mg amiodarone given by ems. Pt currently alert and oriented x4.

## 2019-08-07 NOTE — ED Notes (Signed)
Pt up to bedside commode and reports dizziness has improved slightly but she is still feeling dizzy and lightheaded when sitting and standing. Pt required assistance to ambulate to toilet.

## 2019-08-08 DIAGNOSIS — E041 Nontoxic single thyroid nodule: Secondary | ICD-10-CM | POA: Diagnosis not present

## 2019-08-08 DIAGNOSIS — I10 Essential (primary) hypertension: Secondary | ICD-10-CM | POA: Diagnosis not present

## 2019-08-08 DIAGNOSIS — R42 Dizziness and giddiness: Secondary | ICD-10-CM | POA: Diagnosis not present

## 2019-08-08 DIAGNOSIS — E876 Hypokalemia: Secondary | ICD-10-CM | POA: Diagnosis not present

## 2019-08-08 LAB — BASIC METABOLIC PANEL
Anion gap: 9 (ref 5–15)
BUN: 10 mg/dL (ref 8–23)
CO2: 27 mmol/L (ref 22–32)
Calcium: 9.2 mg/dL (ref 8.9–10.3)
Chloride: 103 mmol/L (ref 98–111)
Creatinine, Ser: 0.97 mg/dL (ref 0.44–1.00)
GFR calc Af Amer: >60 mL/min (ref >60)
GFR calc non Af Amer: >60 mL/min (ref >60)
Glucose, Bld: 115 mg/dL — ABNORMAL HIGH (ref 70–99)
Potassium: 3.7 mmol/L (ref 3.5–5.1)
Sodium: 139 mmol/L (ref 135–145)

## 2019-08-08 MED ORDER — METOPROLOL TARTRATE 25 MG PO TABS
25.0000 mg | ORAL_TABLET | Freq: Once | ORAL | Status: AC
  Administered 2019-08-08: 25 mg via ORAL
  Filled 2019-08-08: qty 1

## 2019-08-08 MED ORDER — METOPROLOL TARTRATE 25 MG PO TABS: 25.0000 mg | ORAL_TABLET | Freq: Two times a day (BID) | ORAL | 11 refills | Status: DC

## 2019-08-08 NOTE — Progress Notes (Signed)
Patient discharged to home. Tele and IV d/c'd.  Verbalizes understanding of discharge instructions. 

## 2019-08-08 NOTE — Discharge Instructions (Signed)
Follow-up with primary care physician in 3 to 4 days.  PCP to consider repeating potassium and magnesium during the follow-up visit

## 2019-08-08 NOTE — Care Management CC44 (Signed)
Condition Code 44 Documentation Completed  Patient Details  Name: Julie James MRN: [id number] Date of Birth: [birth year]   Condition Code 44 given:  Yes Patient signature on Condition Code 44 notice:  Yes Documentation of 2 MD's agreement:  Yes Code 44 added to claim:  Yes    Ross Ludwig, LCSW 08/08/2019, 12:43 PM

## 2019-08-08 NOTE — Discharge Summary (Signed)
[facility] NAME: Julie James    MR#:  [id number]  DATE OF BIRTH:  22-Dec-1950  DATE OF ADMISSION:  08/07/2019 ADMITTING PHYSICIAN: Lang Snow, NP  DATE OF DISCHARGE: 08/08/19   PRIMARY CARE PHYSICIAN: Mikey College, NP    ADMISSION DIAGNOSIS:  Hypokalemia [E87.6] Vertigo [R42] Bigeminy [I49.9]  DISCHARGE DIAGNOSIS:  Active Problems:   Drug-induced hypokalemia   SECONDARY DIAGNOSIS:   Past Medical History:  Diagnosis Date  . Hypertension     HOSPITAL COURSE:   HPI  68 year old female with past medical history of hypertension, hyperlipidemia, and thyroid nodule presenting to the ED with chief complaints of generalized weakness, dizziness and nausea.  Patient report onset of symptoms since last night. Describes dizziness as a spinning sensation worse with both lying down and sitting up with associated symptoms of nausea without vomiting. Denies associated symptoms of tinnitus, hearing loss, ear fullness, otalgia, altered sensorium, speech abnormality, cranial nerve deficit,seizures, focal motor or sensory deficits, diplopia, or vomiting, syncope or LOC, paresthesia (numbness, tingling, pins-and-needles sensation) or a heavy feeling in an extremity.  Denies fevers, chills, chest pain, or shortness of breath.  On arrival to the ED, she was afebrile with blood pressure 141/68 mm Hg and pulse rate 90 beats/min. There were no focal neurological deficits; she was alert and oriented x 4.  Per EMS report, patient had a run of V. tach for which she was given IV amiodarone 150 mg, 4 mg of Zofran and 250 mL's normal saline on route.  Initial labs revealed lactic 2.1, potassium 2.7, COVID-19 negative, CBC unremarkable.  UA shows small leuks. ECG showed sinus rhythm of 92 beats per minute, ventricular bigeminy, normal axis, and normal QT.Marland Kitchen A non-contrast head CT showed no acute intracranial abnormality.   1.  Acute onset dizziness -no focal neurologic deficit to suggest ischemic event.  Arrhythmias noted on ECG may be likely contributing. - Monitor patient on telemetry unit - CT head reviewed and shows no acute intracranial abnormality -Treat with meclizine as needed, dizziness completely resolved not orthostatic - PRN antiemetics  2. Hypokalemia -likely drug induced in a patient on chlorthalidone - Patient started on D5 half-normal saline with 40 mEq of potassium in the ED - Stop chlorthalidone -  magnesium at 2.0.  After potassium supplementation was given potassium is at 3.7 - PCP to monitor monitor patient's potassium + mag during the follow-up visit -Patient is asymptomatic today  3. HTN  + Goal BP <130/80 - Stop chlorthalidone in the setting of hypokalemia - PRN IV hydralazine given during the hospital course  4.Thyroid nodule - Patient is being worked up for this by PCP  5. DVT prophylaxis - Enoxaparin / Heparin SubQ if poor mobility and stay >24hrs  Discharge patient home  DISCHARGE CONDITIONS:   fair  CONSULTS OBTAINED:     PROCEDURES  NONE   DRUG ALLERGIES:  No Known Allergies  DISCHARGE MEDICATIONS:   Allergies as of 08/08/2019   No Known Allergies     Medication List    STOP taking these medications   chlorthalidone 25 MG tablet Commonly known as: HYGROTON     TAKE these medications   CALCIUM-MAGNESIUM-ZINC PO Take 3 tablets by mouth daily.   cyanocobalamin 100 MCG tablet Take 1,000 mcg by mouth daily.   metoprolol tartrate 25 MG tablet Commonly known as: LOPRESSOR Take 1 tablet (25 mg total) by mouth 2 (two) times daily.   multivitamin tablet Take  2 tablets by mouth daily.   simvastatin 40 MG tablet Commonly known as: ZOCOR Take 1 tablet (40 mg total) by mouth at bedtime.        DISCHARGE INSTRUCTIONS:   Follow-up with primary care physician in 3 to 4 days.  PCP to consider repeating potassium and magnesium during the follow-up  visit  DIET:  Cardiac diet  DISCHARGE CONDITION:  Fair  ACTIVITY:  Activity as tolerated  OXYGEN:  Home Oxygen: No.   Oxygen Delivery: room air  DISCHARGE LOCATION:  home   If you experience worsening of your admission symptoms, develop shortness of breath, life threatening emergency, suicidal or homicidal thoughts you must seek medical attention immediately by calling 911 or calling your MD immediately  if symptoms less severe.  You Must read complete instructions/literature along with all the possible adverse reactions/side effects for all the Medicines you take and that have been prescribed to you. Take any new Medicines after you have completely understood and accpet all the possible adverse reactions/side effects.   Please note  You were cared for by a hospitalist during your hospital stay. If you have any questions about your discharge medications or the care you received while you were in the hospital after you are discharged, you can call the unit and asked to speak with the hospitalist on call if the hospitalist that took care of you is not available. Once you are discharged, your primary care physician will handle any further medical issues. Please note that NO REFILLS for any discharge medications will be authorized once you are discharged, as it is imperative that you return to your primary care physician (or establish a relationship with a primary care physician if you do not have one) for your aftercare needs so that they can reassess your need for medications and monitor your lab values.     Today  Chief Complaint  Patient presents with  . Weakness   Patient is feeling fine.  Denies any palpitations or chest pain or shortness of breath.  Denies any dizzy spells.  Wants to go home  ROS:  CONSTITUTIONAL: Denies fevers, chills. Denies any fatigue, weakness.  EYES: Denies blurry vision, double vision, eye pain. EARS, NOSE, THROAT: Denies tinnitus, ear pain, hearing  loss. RESPIRATORY: Denies cough, wheeze, shortness of breath.  CARDIOVASCULAR: Denies chest pain, palpitations, edema.  GASTROINTESTINAL: Denies nausea, vomiting, diarrhea, abdominal pain. Denies bright red blood per rectum. GENITOURINARY: Denies dysuria, hematuria. ENDOCRINE: Denies nocturia or thyroid problems. HEMATOLOGIC AND LYMPHATIC: Denies easy bruising or bleeding. SKIN: Denies rash or lesion. MUSCULOSKELETAL: Denies pain in neck, back, shoulder, knees, hips or arthritic symptoms.  NEUROLOGIC: Denies paralysis, paresthesias.  PSYCHIATRIC: Denies anxiety or depressive symptoms.   VITAL SIGNS:  Blood pressure (!) 146/73, pulse 82, temperature 98.7 F (37.1 C), temperature source Oral, resp. rate 19, height 5\' 5"  (1.651 m), weight 113.5 kg, SpO2 98 %.  I/O:    Intake/Output Summary (Last 24 hours) at 08/08/2019 1223 Last data filed at 08/08/2019 0900 Gross per 24 hour  Intake 1761.95 ml  Output 0 ml  Net 1761.95 ml    PHYSICAL EXAMINATION:  GENERAL:  68 y.o.-year-old patient lying in the bed with no acute distress.  EYES: Pupils equal, round, reactive to light and accommodation. No scleral icterus. Extraocular muscles intact.  HEENT: Head atraumatic, normocephalic. Oropharynx and nasopharynx clear.  NECK:  Supple, no jugular venous distention. No thyroid enlargement, no tenderness.  LUNGS: Normal breath sounds bilaterally, no wheezing, rales,rhonchi or crepitation. No  use of accessory muscles of respiration.  CARDIOVASCULAR: S1, S2 normal. No murmurs, rubs, or gallops.  ABDOMEN: Soft, non-tender, non-distended. Bowel sounds present.  EXTREMITIES: No pedal edema, cyanosis, or clubbing.  NEUROLOGIC: Cranial nerves II through XII are intact. Muscle strength 5/5 in all extremities. Sensation intact. Gait not checked.  PSYCHIATRIC: The patient is alert and oriented x 3.  SKIN: No obvious rash, lesion, or ulcer.   DATA REVIEW:   CBC Recent Labs  Lab 08/07/19 0654  WBC 5.9   HGB 11.3*  HCT 35.1*  PLT 343    Chemistries  Recent Labs  Lab 08/07/19 0654 08/07/19 0929  08/08/19 0816  NA 141  --   --  139  K 2.7*  --    < > 3.7  CL 103  --   --  103  CO2 27  --   --  27  GLUCOSE 144*  --   --  115*  BUN 16  --   --  10  CREATININE 0.76  --   --  0.97  CALCIUM 9.0  --   --  9.2  MG 2.2 2.0  --   --   AST 20  --   --   --   ALT 17  --   --   --   ALKPHOS 57  --   --   --   BILITOT 0.5  --   --   --    < > = values in this interval not displayed.    Cardiac Enzymes No results for input(s): TROPONINI in the last 168 hours.  Microbiology Results  Results for orders placed or performed during the hospital encounter of 08/07/19  SARS Coronavirus 2 [facility] order, Performed in [facility] hospital lab) Nasopharyngeal Nasopharyngeal Swab     Status: None   Collection Time: 08/07/19  6:46 AM   Specimen: Nasopharyngeal Swab  Result Value Ref Range Status   SARS Coronavirus 2 NEGATIVE NEGATIVE Final    Comment: (NOTE) If result is NEGATIVE SARS-CoV-2 target nucleic acids are NOT DETECTED. The SARS-CoV-2 RNA is generally detectable in upper and lower  respiratory specimens during the acute phase of infection. The lowest  concentration of SARS-CoV-2 viral copies this assay can detect is 250  copies / mL. A negative result does not preclude SARS-CoV-2 infection  and should not be used as the sole basis for treatment or other  patient management decisions.  A negative result may occur with  improper specimen collection / handling, submission of specimen other  than nasopharyngeal swab, presence of viral mutation(s) within the  areas targeted by this assay, and inadequate number of viral copies  (<250 copies / mL). A negative result must be combined with clinical  observations, patient history, and epidemiological information. If result is POSITIVE SARS-CoV-2 target nucleic acids are DETECTED. The SARS-CoV-2 RNA is generally detectable in upper and  lower  respiratory specimens dur ing the acute phase of infection.  Positive  results are indicative of active infection with SARS-CoV-2.  Clinical  correlation with patient history and other diagnostic information is  necessary to determine patient infection status.  Positive results do  not rule out bacterial infection or co-infection with other viruses. If result is PRESUMPTIVE POSTIVE SARS-CoV-2 nucleic acids MAY BE PRESENT.   A presumptive positive result was obtained on the submitted specimen  and confirmed on repeat testing.  While 2019 novel coronavirus  (SARS-CoV-2) nucleic acids may be present in the submitted sample  additional confirmatory testing may be necessary for epidemiological  and / or clinical management purposes  to differentiate between  SARS-CoV-2 and other Sarbecovirus currently known to infect humans.  If clinically indicated additional testing with an alternate test  methodology [phone]) is advised. The SARS-CoV-2 RNA is generally  detectable in upper and lower respiratory sp ecimens during the acute  phase of infection. The expected result is Negative. Fact Sheet for Patients:  StrictlyIdeas.no Fact Sheet for Healthcare Providers: BankingDealers.co.za This test is not yet approved or cleared by the [place] FDA and has been authorized for detection and/or diagnosis of SARS-CoV-2 by FDA under an Emergency Use Authorization (EUA).  This EUA will remain in effect (meaning this test can be used) for the duration of the COVID-19 declaration under Section 564(b)(1) of the Act, 21 U.S.C. section 360bbb-3(b)(1), unless the authorization is terminated or revoked sooner. Performed at [facility], [address] [place], [place] [address]     RADIOLOGY:  Ct Head Wo Contrast  Result Date: 08/07/2019 CLINICAL DATA:  Dizziness and weakness EXAM: CT HEAD WITHOUT CONTRAST TECHNIQUE: Contiguous axial  images were obtained from the base of the skull through the vertex without intravenous contrast. COMPARISON:  02/20/2014 FINDINGS: Brain: Mild atrophic changes are noted. No findings to suggest acute hemorrhage, acute infarction or space-occupying mass lesion are noted. Vascular: No hyperdense vessel or unexpected calcification. Skull: Normal. Negative for fracture or focal lesion. Sinuses/Orbits: No acute finding. Other: Prominent sella turcica is again noted. IMPRESSION: Mild atrophic changes stable from the previous exam. No acute abnormality noted. Electronically Signed   By: Inez Catalina M.D.   On: 08/07/2019 08:38    EKG:   Orders placed or performed during the hospital encounter of 08/07/19  . ED EKG  . ED EKG  . EKG 12-Lead  . EKG 12-Lead  . EKG 12-Lead  . EKG 12-Lead      Management plans discussed with the patient, family and they are in agreement.  CODE STATUS:     Code Status Orders  (From admission, onward)         Start     Ordered   08/07/19 0931  Full code  Continuous     08/07/19 0931        Code Status History    This patient has a current code status but no historical code status.   Advance Care Planning Activity      TOTAL TIME TAKING CARE OF THIS PATIENT: 43 minutes.   Note: This dictation was prepared with Dragon dictation along with smaller phrase technology. Any transcriptional errors that result from this process are unintentional.   @[facility] @  on 08/08/2019 at 12:23 PM  Between 7am to 6pm - Pager - [phone]  After 6pm go to [email or web address] - password [email or web address]  Office  [phone]  CC: Primary care physician; Mikey College, NP

## 2019-08-08 NOTE — Care Management Obs Status (Signed)
[place] NOTIFICATION   Patient Details  Name: Julie James MRN: [id number] Date of Birth: 09-29-1951   Medicare Observation Status Notification Given:  Yes    Cecil Cobbs 08/08/2019, 12:43 PM

## 2019-08-09 LAB — HIV ANTIBODY (ROUTINE TESTING W REFLEX): HIV Screen 4th Generation wRfx: NONREACTIVE

## 2019-08-13 ENCOUNTER — Encounter: Payer: Self-pay | Admitting: Emergency Medicine

## 2019-08-13 ENCOUNTER — Other Ambulatory Visit: Payer: Self-pay

## 2019-08-13 DIAGNOSIS — J810 Acute pulmonary edema: Secondary | ICD-10-CM | POA: Insufficient documentation

## 2019-08-13 DIAGNOSIS — Z79899 Other long term (current) drug therapy: Secondary | ICD-10-CM | POA: Diagnosis not present

## 2019-08-13 DIAGNOSIS — I517 Cardiomegaly: Secondary | ICD-10-CM | POA: Diagnosis not present

## 2019-08-13 DIAGNOSIS — M7989 Other specified soft tissue disorders: Secondary | ICD-10-CM | POA: Diagnosis present

## 2019-08-13 DIAGNOSIS — R600 Localized edema: Secondary | ICD-10-CM | POA: Diagnosis not present

## 2019-08-13 DIAGNOSIS — J811 Chronic pulmonary edema: Secondary | ICD-10-CM | POA: Diagnosis not present

## 2019-08-13 DIAGNOSIS — R0602 Shortness of breath: Secondary | ICD-10-CM | POA: Diagnosis not present

## 2019-08-13 DIAGNOSIS — I10 Essential (primary) hypertension: Secondary | ICD-10-CM | POA: Insufficient documentation

## 2019-08-13 LAB — COMPREHENSIVE METABOLIC PANEL
ALT: 18 U/L (ref 0–44)
AST: 23 U/L (ref 15–41)
Albumin: 4 g/dL (ref 3.5–5.0)
Alkaline Phosphatase: 61 U/L (ref 38–126)
Anion gap: 9 (ref 5–15)
BUN: 14 mg/dL (ref 8–23)
CO2: 26 mmol/L (ref 22–32)
Calcium: 9.1 mg/dL (ref 8.9–10.3)
Chloride: 106 mmol/L (ref 98–111)
Creatinine, Ser: 0.84 mg/dL (ref 0.44–1.00)
GFR calc Af Amer: >60 mL/min (ref >60)
GFR calc non Af Amer: >60 mL/min (ref >60)
Glucose, Bld: 125 mg/dL — ABNORMAL HIGH (ref 70–99)
Potassium: 3.8 mmol/L (ref 3.5–5.1)
Sodium: 141 mmol/L (ref 135–145)
Total Bilirubin: 0.5 mg/dL (ref 0.3–1.2)
Total Protein: 7.3 g/dL (ref 6.5–8.1)

## 2019-08-13 LAB — CBC WITH DIFFERENTIAL/PLATELET
Abs Immature Granulocytes: 0.02 10*3/uL (ref 0.00–0.07)
Basophils Absolute: 0.1 10*3/uL (ref 0.0–0.1)
Basophils Relative: 1 %
Eosinophils Absolute: 0.2 10*3/uL (ref 0.0–0.5)
Eosinophils Relative: 2 %
HCT: 33.2 % — ABNORMAL LOW (ref 36.0–46.0)
Hemoglobin: 10.7 g/dL — ABNORMAL LOW (ref 12.0–15.0)
Immature Granulocytes: 0 %
Lymphocytes Relative: 42 %
Lymphs Abs: 2.9 10*3/uL (ref 0.7–4.0)
MCH: 28.5 pg (ref 26.0–34.0)
MCHC: 32.2 g/dL (ref 30.0–36.0)
MCV: 88.5 fL (ref 80.0–100.0)
Monocytes Absolute: 0.5 10*3/uL (ref 0.1–1.0)
Monocytes Relative: 7 %
Neutro Abs: 3.4 10*3/uL (ref 1.7–7.7)
Neutrophils Relative %: 48 %
Platelets: 333 10*3/uL (ref 150–400)
RBC: 3.75 MIL/uL — ABNORMAL LOW (ref 3.87–5.11)
RDW: 14.8 % (ref 11.5–15.5)
WBC: 7.1 10*3/uL (ref 4.0–10.5)
nRBC: 0 % (ref 0.0–0.2)

## 2019-08-13 LAB — TROPONIN I (HIGH SENSITIVITY): Troponin I (High Sensitivity): 4 ng/L (ref <18)

## 2019-08-13 NOTE — ED Triage Notes (Signed)
Patient ambulatory to triage with steady gait, without difficulty or distress noted; pt reports recent BP med changes; d/c recently for hypokalemia; c/o swelling to LE(s), SHOB on exertion

## 2019-08-14 ENCOUNTER — Emergency Department
Admission: EM | Admit: 2019-08-14 | Discharge: 2019-08-14 | Disposition: A | Discharge status: Home / Self Care | Payer: Medicare Other | Attending: Emergency Medicine | Admitting: Emergency Medicine

## 2019-08-14 ENCOUNTER — Emergency Department: Payer: Medicare Other

## 2019-08-14 DIAGNOSIS — R609 Edema, unspecified: Secondary | ICD-10-CM

## 2019-08-14 DIAGNOSIS — R0989 Other specified symptoms and signs involving the circulatory and respiratory systems: Secondary | ICD-10-CM

## 2019-08-14 DIAGNOSIS — J810 Acute pulmonary edema: Secondary | ICD-10-CM | POA: Diagnosis not present

## 2019-08-14 DIAGNOSIS — I517 Cardiomegaly: Secondary | ICD-10-CM | POA: Diagnosis not present

## 2019-08-14 LAB — BRAIN NATRIURETIC PEPTIDE: B Natriuretic Peptide: 234 pg/mL — ABNORMAL HIGH (ref 0.0–100.0)

## 2019-08-14 IMAGING — DX DG CHEST 1V PORT
1 series · 1 of 1 positions shown · non-contrast
Comparison: [DATE]

CLINICAL DATA: Dyspnea on exertion

EXAM:
PORTABLE CHEST 1 VIEW

[chest ap]
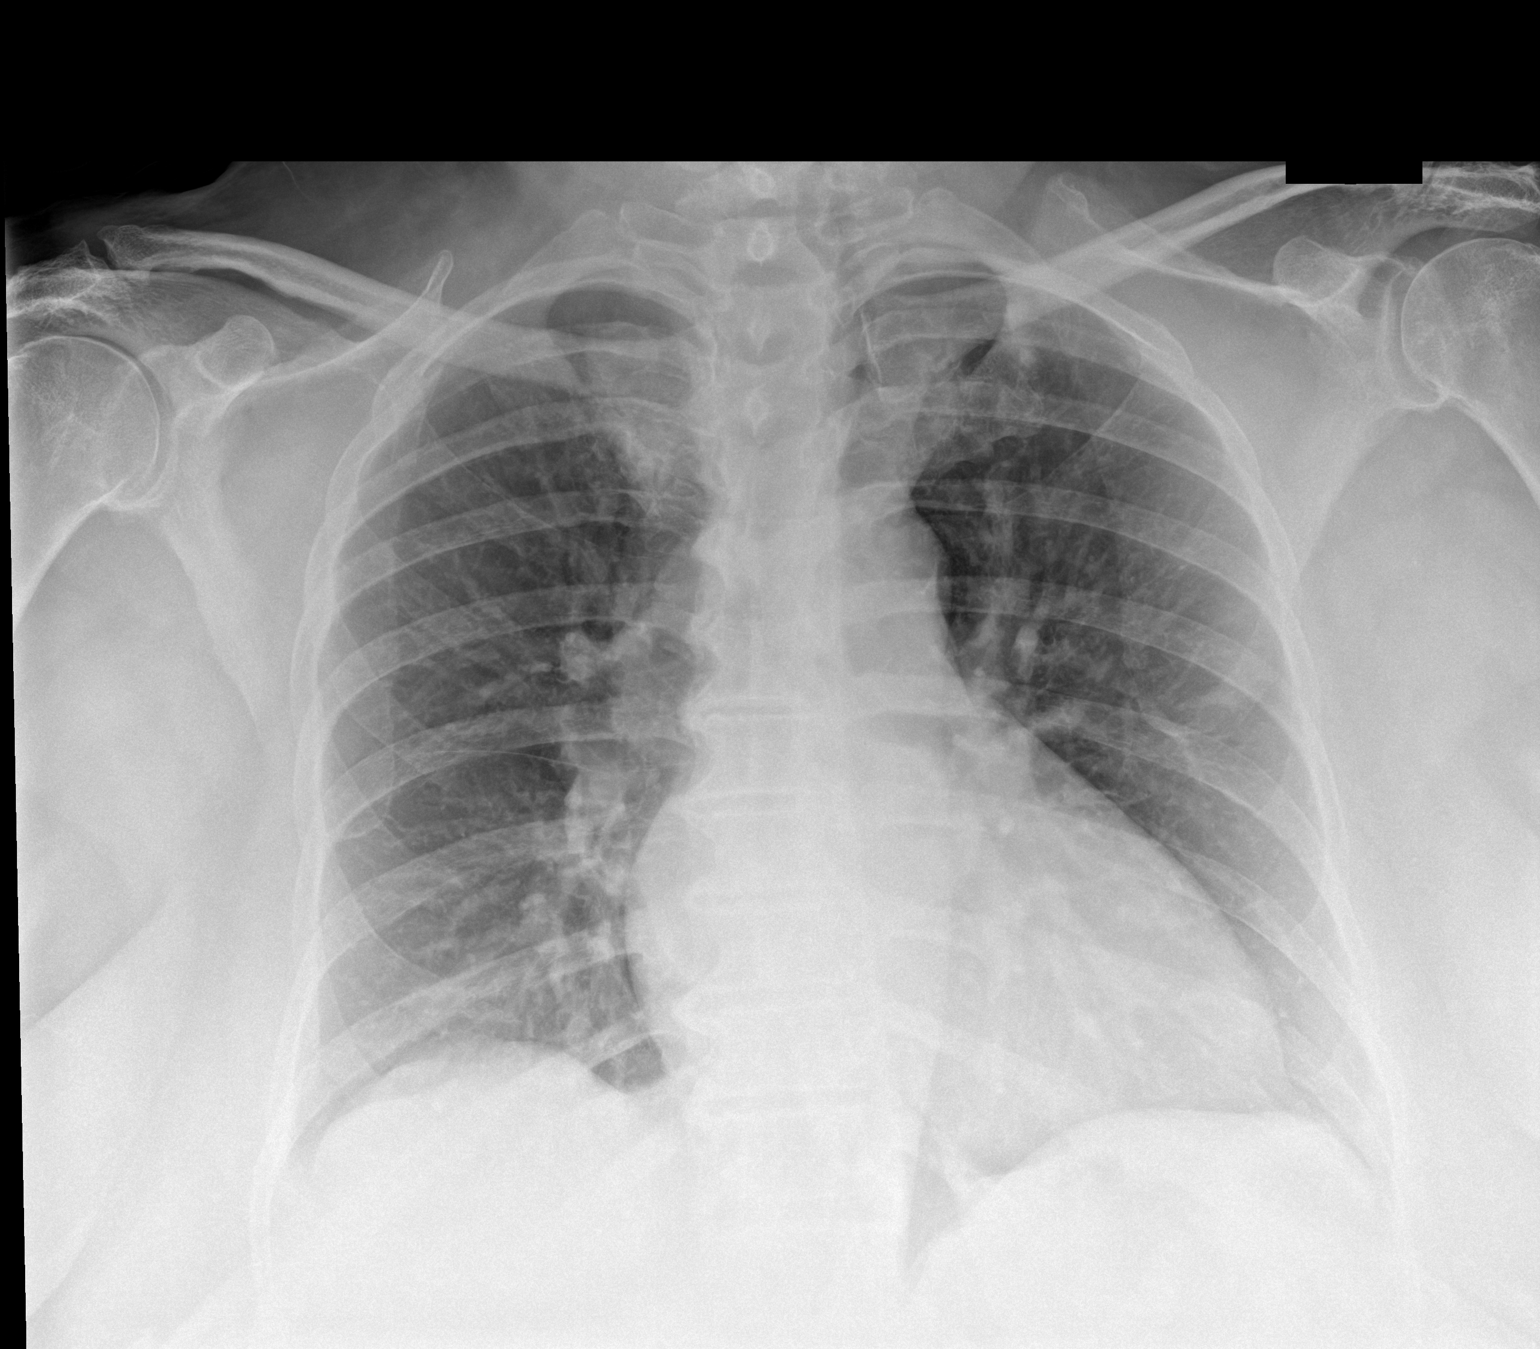

[1 of 1 positions shown; findings below may reference images not displayed]

FINDINGS: Cardiomegaly is seen. There is pulmonary vascular congestion. No
large airspace consolidation or pleural effusion. Bilateral shoulder
arthropathy seen.
IMPRESSION: Cardiomegaly and pulmonary vascular congestion.

## 2019-08-14 MED ORDER — FUROSEMIDE 40 MG PO TABS
20.0000 mg | ORAL_TABLET | Freq: Once | ORAL | Status: AC
  Administered 2019-08-14: 05:00:00 20 mg via ORAL
  Filled 2019-08-14: qty 1

## 2019-08-14 NOTE — Discharge Instructions (Addendum)
You were seen today in the Emergency Department (ED) for swelling in your legs.  As we discussed, your workup today was reassuring.  This condition is known as "peripheral edema", and it is not dangerous although it can be uncomfortable.  Fortunately at this time it appears that you have no emergent medical condition at this time and that you are safe to go home and follow up as recommended in this paperwork.  We recommend that you keep your legs elevated as much as possible, particularly when you are sitting down or even lying down at night (use a pillow or two under your lower legs).  Consider buying compression stockings at the pharmacy or using Ace (elastic) wraps on your legs they can provide firm but not tight pressure to help reduce the swelling.  Please continue taking your regular medications unless told otherwise in these documents.  Please take any new medications that may have been prescribed during this visit.  Follow up with your primary care doctor to discuss whether or not you should be on a different diuretic to replace the chlorthalidone you were taking until recently.  Please return immediately to the Emergency Department if you develop any new or worsening symptoms that concern you.

## 2019-08-14 NOTE — ED Provider Notes (Addendum)
[facility] Emergency Department Provider Note  ____________________________________________   First MD Initiated Contact with Patient 08/14/19 0411     (approximate)  I have reviewed the triage vital signs and the nursing notes.   HISTORY  Chief Complaint Leg Swelling    HPI Julie James is a 68 y.o. female with medical history as listed below who presents for evaluation of some swelling in her legs as well as some shortness of breath with exertion.  Her symptoms are relatively mild but she was concerned about swelling in the legs.  She recently was taken off of chlorthalidone by her primary care provider because of some hypokalemia.  She is now on metoprolol for her blood pressure and she also takes an anticholesterol medicine but no other diuretics or antihypertensives.  Since she has been waiting in the emergency department her symptoms have improved.  She is able to lie down in a recumbent position without any shortness of breath and her swelling in the legs has gone down.  She denies any recent fever.  She denies sore throat, cough, chest pain, nausea, vomiting, and abdominal pain.  She has had no contact with known COVID-19 patients.         Past Medical History:  Diagnosis Date   Hypertension     Patient Active Problem List   Diagnosis Date Noted   Drug-induced hypokalemia 08/07/2019   Lipoma of abdomen 07/30/2017   Lipoma of right thigh 07/30/2017   Hypertension 06/23/2016   Hyperlipidemia 06/23/2016   Thyroid nodule 03/16/2014    History reviewed. No pertinent surgical history.  Prior to Admission medications   Medication Sig Start Date End Date Taking? Authorizing Provider  CALCIUM-MAGNESIUM-ZINC PO Take 3 tablets by mouth daily.    [provider]  cyanocobalamin 100 MCG tablet Take 1,000 mcg by mouth daily.    [provider]  metoprolol tartrate (LOPRESSOR) 25 MG tablet Take 1 tablet (25 mg total) by  mouth 2 (two) times daily. 08/08/19 08/07/20  Nicholes Mango, MD  Multiple Vitamin (MULTIVITAMIN) tablet Take 2 tablets by mouth daily.    [provider]  simvastatin (ZOCOR) 40 MG tablet Take 1 tablet (40 mg total) by mouth at bedtime. 02/17/19   Mikey College, NP    Allergies Patient has no known allergies.  Family History  Problem Relation Age of Onset   Cancer Sister        breast   Cancer Sister        breast    Social History Social History   Tobacco Use   Smoking status: Never Smoker   Smokeless tobacco: Never Used  Substance Use Topics   Alcohol use: No   Drug use: No    Review of Systems Constitutional: No fever/chills Eyes: No visual changes. ENT: No sore throat. Cardiovascular: Denies chest pain. Respiratory: No cough, shortness of breath with exertion. Gastrointestinal: No abdominal pain.  No nausea, no vomiting.  No diarrhea.  No constipation. Genitourinary: Negative for dysuria. Musculoskeletal: Some swelling in her legs.  Negative for neck pain.  Negative for back pain. Integumentary: Negative for rash. Neurological: Negative for headaches, focal weakness or numbness.   ____________________________________________   PHYSICAL EXAM:  VITAL SIGNS: ED Triage Vitals  Enc Vitals Group     BP 08/13/19 2313 (!) 188/70     Pulse Rate 08/13/19 2313 94     Resp 08/13/19 2313 18     Temp 08/13/19 2313 98.5 F (36.9 C)  Temp Source 08/13/19 2313 Oral     SpO2 08/13/19 2313 97 %     Weight 08/13/19 2312 111.1 kg (245 lb)     Height 08/13/19 2312 1.651 m (5\' 5" )     Head Circumference --      Peak Flow --      Pain Score --      Pain Loc --      Pain Edu? --      Excl. in [place]? --     Constitutional: Alert and oriented.  No acute distress in recumbent position. Eyes: Conjunctivae are normal.  Head: Atraumatic. Nose: No congestion/rhinnorhea. Mouth/Throat: Mucous membranes are moist. Neck: No stridor.  No meningeal signs.     Cardiovascular: Normal rate, regular rhythm. Good peripheral circulation. Grossly normal heart sounds. Respiratory: Normal respiratory effort.  No retractions. Gastrointestinal: Soft and nontender. No distention.  Musculoskeletal: No pitting edema at this time. No gross deformities of extremities. Neurologic:  Normal speech and language. No gross focal neurologic deficits are appreciated.  Skin:  Skin is warm, dry and intact. Psychiatric: Mood and affect are normal. Speech and behavior are normal.  ____________________________________________   LABS (all labs ordered are listed, but only abnormal results are displayed)  Labs Reviewed  CBC WITH DIFFERENTIAL/PLATELET - Abnormal; Notable for the following components:      Result Value   RBC 3.75 (*)    Hemoglobin 10.7 (*)    HCT 33.2 (*)    All other components within normal limits  COMPREHENSIVE METABOLIC PANEL - Abnormal; Notable for the following components:   Glucose, Bld 125 (*)    All other components within normal limits  BRAIN NATRIURETIC PEPTIDE - Abnormal; Notable for the following components:   B Natriuretic Peptide 234.0 (*)    All other components within normal limits  TROPONIN I (HIGH SENSITIVITY)   ____________________________________________  EKG  ED ECG REPORT I, Hinda Kehr, the attending physician, personally viewed and interpreted this ECG.  Date: 08/13/2019 EKG Time: 23: 21 Rate: 90 Rhythm: Sinus rhythm with occasional PVC QRS Axis: normal Intervals: normal ST/T Wave abnormalities: Non-specific ST segment / T-wave changes, but no clear evidence of acute ischemia. Narrative Interpretation: no definitive evidence of acute ischemia; does not meet STEMI criteria.   ____________________________________________  RADIOLOGY I, Hinda Kehr, personally viewed and evaluated these images (plain radiographs) as part of my medical decision making, as well as reviewing the written report by the  radiologist.  ED MD interpretation: Mild pulmonary vascular congestion and cardiomegaly  Official radiology report(s): Dg Chest Portable 1 View  Result Date: 08/14/2019 CLINICAL DATA:  Dyspnea on exertion EXAM: PORTABLE CHEST 1 VIEW COMPARISON:  Apr 22, 2014 FINDINGS: Cardiomegaly is seen. There is pulmonary vascular congestion. No large airspace consolidation or pleural effusion. Bilateral shoulder arthropathy seen. IMPRESSION: Cardiomegaly and pulmonary vascular congestion. Electronically Signed   By: Prudencio Pair M.D.   On: 08/14/2019 03:48    ____________________________________________   PROCEDURES   Procedure(s) performed (including Critical Care):  Procedures   ____________________________________________   INITIAL IMPRESSION / MDM / [place] / ED COURSE  As part of my medical decision making, I reviewed the following data within the [place] notes reviewed and incorporated, Labs reviewed , EKG interpreted , Old chart reviewed and Notes from prior ED visits   Differential diagnosis includes, but is not limited to, mild CHF exacerbation, nonspecific fluid retention, electrolyte abnormality, medication or medication discontinuation side effect.  The patient was just  taken off of chlorthalidone and I suspect as a result is having some retained fluid.  She has no specific diagnosis of CHF but she probably has some degree given that she was on a diuretic.  Her potassium and the rest of her lab work is all stable tonight.  We discussed that and the fact that her symptoms are mild and that she has a primary care provider.  I recommended that I give her a one-time dose of Lasix tonight to help with diuresis but that she rely on her primary care doctor for any long-term medication changes.  She understands and agrees with this plan.  I am giving her furosemide 20 mg by mouth and she will call her PCP in the morning.  In the meantime I recommend she  continue taking her regular medications.  No evidence of an emergent medical condition tonight.  I gave my usual and customary return precautions.       ____________________________________________  FINAL CLINICAL IMPRESSION(S) / ED DIAGNOSES  Final diagnoses:  Peripheral edema  Pulmonary vascular congestion     MEDICATIONS GIVEN DURING THIS VISIT:  Medications  furosemide (LASIX) tablet 20 mg (has no administration in time range)     ED Discharge Orders    None      *Please note:  Jacquilyn Benoit was evaluated in Emergency Department on 08/14/2019 for the symptoms described in the history of present illness. She was evaluated in the context of the global COVID-19 pandemic, which necessitated consideration that the patient might be at risk for infection with the SARS-CoV-2 virus that causes COVID-19. Institutional protocols and algorithms that pertain to the evaluation of patients at risk for COVID-19 are in a state of rapid change based on information released by regulatory bodies including the CDC and federal and state organizations. These policies and algorithms were followed during the patient's care in the ED.  Some ED evaluations and interventions may be delayed as a result of limited staffing during the pandemic.*  Note:  This document was prepared using Dragon voice recognition software and may include unintentional dictation errors.   Hinda Kehr, MD 08/14/19 [id number]    Hinda Kehr, MD 08/14/19 [id number]

## 2019-08-18 ENCOUNTER — Encounter: Payer: Self-pay | Admitting: Nurse Practitioner

## 2019-08-18 ENCOUNTER — Other Ambulatory Visit: Payer: Self-pay

## 2019-08-18 ENCOUNTER — Ambulatory Visit (INDEPENDENT_AMBULATORY_CARE_PROVIDER_SITE_OTHER): Payer: Medicare Other | Admitting: Nurse Practitioner

## 2019-08-18 ENCOUNTER — Ambulatory Visit: Payer: Medicare Other | Admitting: Nurse Practitioner

## 2019-08-18 VITALS — BP 160/72 | HR 66 | Ht 65.0 in | Wt 258.8 lb

## 2019-08-18 DIAGNOSIS — E876 Hypokalemia: Secondary | ICD-10-CM

## 2019-08-18 DIAGNOSIS — M7989 Other specified soft tissue disorders: Secondary | ICD-10-CM | POA: Diagnosis not present

## 2019-08-18 DIAGNOSIS — Z09 Encounter for follow-up examination after completed treatment for conditions other than malignant neoplasm: Secondary | ICD-10-CM

## 2019-08-18 DIAGNOSIS — I10 Essential (primary) hypertension: Secondary | ICD-10-CM | POA: Diagnosis not present

## 2019-08-18 MED ORDER — METOPROLOL TARTRATE 37.5 MG PO TABS: 25.0000 mg | ORAL_TABLET | Freq: Two times a day (BID) | ORAL | 11 refills | Status: DC

## 2019-08-18 MED ORDER — METOPROLOL TARTRATE 37.5 MG PO TABS: 37.5000 mg | ORAL_TABLET | Freq: Two times a day (BID) | ORAL | 11 refills | Status: DC

## 2019-08-18 MED ORDER — MEDICAL COMPRESSION SOCKS MISC: 1.0000 | Freq: Every day | 1 refills | Status: DC

## 2019-08-18 NOTE — Progress Notes (Signed)
Subjective:    Patient ID: Julie James, female    DOB: 12-Jan-1951, 68 y.o.   MRN: [id number]  Julie James is a 68 y.o. female presenting on 08/18/2019 for Hypertension (edema in lower extermites ) and ER followup (Hypokalemia-likely drug induced in a patient on chlorthalidone)   [place] VISIT Hospital/Location: [facility] Date of Admission: 08/07/2019 Date of Discharge: 08/08/2019 Transitions of care telephone call: n/a  Reason for Admission: hypokalemia Primary (+Secondary) Diagnosis: vertigo (bigeminy, hypertension)  [facility] H&P and Discharge Summary have been reviewed - Patient presents today 10 days after recent hospitalization. Brief summary of recent course, patient had symptoms of dizziness, nausea, paresthesias, She was transported to ER via EMS and had witnessed run Vtach, resolved with IV amiodarone.  Patient was evaluated, found to have hypokalemia, hospitalized, treated with potassium repletion, medication changes. - New medications on discharge: Metoprolol - Changes to current meds on discharge: STOP chlorthalidone  - Today reports overall has done well after discharge. Symptoms of dizziness have resolved. Patient is now having new leg swelling for which she returned to ER on 08/14/2019. This has not improved.  Hypertension  - She is checking BP at home or outside of clinic.  Readings labile. - Current medications: metoprolol 25 mg twice daily, tolerating well without side effects - She is symptomatic with leg swelling.  Leg cramps have resolved. - Pt denies headache, lightheadedness, dizziness, changes in vision, chest tightness/pressure, palpitations, leg swelling, sudden loss of speech or loss of consciousness. - She  reports no regular exercise routine. - Her diet is moderate in salt, moderate in fat, and moderate in carbohydrates.    Social History   Tobacco Use  . Smoking status: Never Smoker  . Smokeless tobacco: Never Used   Substance Use Topics  . Alcohol use: No  . Drug use: No    Review of Systems Per HPI unless specifically indicated above  Outpatient Encounter Medications as of 08/18/2019  Medication Sig  . Metoprolol Tartrate 37.5 MG TABS Take 37.5 mg by mouth 2 (two) times daily.  . simvastatin (ZOCOR) 40 MG tablet Take 1 tablet (40 mg total) by mouth at bedtime.  . [DISCONTINUED] metoprolol tartrate (LOPRESSOR) 25 MG tablet Take 1 tablet (25 mg total) by mouth 2 (two) times daily.  . [DISCONTINUED] metoprolol tartrate 37.5 MG TABS Take 25 mg by mouth 2 (two) times daily.  Marland Kitchen CALCIUM-MAGNESIUM-ZINC PO Take 3 tablets by mouth daily.  . cyanocobalamin 100 MCG tablet Take 1,000 mcg by mouth daily.  Water engineer Bandages & Supports (MEDICAL COMPRESSION SOCKS) MISC 1 Device by Does not apply route daily. Wear daily during day to help reduce leg swelling.  8-15 mmHg  . Multiple Vitamin (MULTIVITAMIN) tablet Take 2 tablets by mouth daily.   No facility-administered encounter medications on file as of 08/18/2019.       Objective:    BP (!) 160/72 (BP Location: Right Arm, Patient Position: Sitting, Cuff Size: Large)   Pulse 66   Ht 5\' 5"  (1.651 m)   Wt 258 lb 12.8 oz (117.4 kg)   BMI 43.07 kg/m   Wt Readings from Last 3 Encounters:  08/18/19 258 lb 12.8 oz (117.4 kg)  08/13/19 245 lb (111.1 kg)  08/08/19 250 lb 4.8 oz (113.5 kg)    Physical Exam Vitals signs reviewed.  Constitutional:      General: She is awake. She is not in acute distress.    Appearance: She is well-developed.  HENT:  Head: Normocephalic and atraumatic.  Neck:     Musculoskeletal: Normal range of motion and neck supple.     Vascular: No carotid bruit.  Cardiovascular:     Rate and Rhythm: Normal rate and regular rhythm.     Pulses:          Radial pulses are 2+ on the right side and 2+ on the left side.       Posterior tibial pulses are 1+ on the right side and 1+ on the left side.     Heart sounds: Normal heart sounds, S1  normal and S2 normal.  Pulmonary:     Effort: Pulmonary effort is normal. No respiratory distress.     Breath sounds: Normal breath sounds and air entry.  Musculoskeletal:     Right lower leg: 1+ Pitting Edema present.     Left lower leg: 1+ Pitting Edema present.  Skin:    General: Skin is warm and dry.  Neurological:     Mental Status: She is alert and oriented to person, place, and time.  Psychiatric:        Attention and Perception: Attention normal.        Mood and Affect: Mood and affect normal.        Behavior: Behavior normal. Behavior is cooperative.        Thought Content: Thought content normal.        Judgment: Judgment normal.    Results for orders placed or performed in visit on 08/18/19  Magnesium  Result Value Ref Range   Magnesium 1.9 1.5 - 2.5 mg/dL  BASIC METABOLIC PANEL WITH GFR  Result Value Ref Range   Glucose, Bld 88 65 - 99 mg/dL   BUN 11 7 - 25 mg/dL   Creat 0.84 0.50 - 0.99 mg/dL   GFR, Est Non African American 72 > OR = 60 mL/min/1.71m2   GFR, Est African American 84 > OR = 60 mL/min/1.34m2   BUN/Creatinine Ratio NOT APPLICABLE 6 - 22 (calc)   Sodium 141 135 - 146 mmol/L   Potassium 4.4 3.5 - 5.3 mmol/L   Chloride 108 98 - 110 mmol/L   CO2 29 20 - 32 mmol/L   Calcium 9.3 8.6 - 10.4 mg/dL      Assessment & Plan:   Problem List Items Addressed This Visit      Cardiovascular and Mediastinum   Hypertension Uncontrolled hypertension.  BP goal < 130/80.  Medication change off chlorthalidone onto metoprolol tolerated well, but not providing adequate BP control.  Patient is working on lifestyle modifications.  Taking medications tolerating well without side effects.   Plan: 1. INCREASE to metoprolol tartrate 37.5 mg bid (up from 25 mg bid). 2. Obtain labs  3. Encouraged heart healthy diet and increasing exercise to 30 minutes most days of the week. 4. Check BP 1-2 x per week at home, keep log, and bring to clinic at next appointment.  Reviewed BP  goals. 5. Follow up 3 months.     Relevant Medications   Metoprolol Tartrate 37.5 MG TABS   Other Relevant Orders   Magnesium (Completed)   BASIC METABOLIC PANEL WITH GFR (Completed)    Other Visit Diagnoses    Hospital discharge follow-up    -  Primary   Hypokalemia due to excessive renal loss of potassium     Patient has done well after discharge.  Patient's hypokalemia had resolved prior to discharge, has been rechecked in ED as well with normal values.  See management of hypertension above.   Relevant Orders   Magnesium (Completed)   BASIC METABOLIC PANEL WITH GFR (Completed)   Leg swelling     Likely venous insufficiency, not best treated with diuretics given patient's history of hypokalemia.  Thiazide diuretics will be avoided completely.  Potassium wasting diuretics would also not be recommended.  Primary treatment should focus on mechanical support. -START compression socks 8-15 mmHg. Wear daily and remove every night. - Keep feet elevated during day. - Swelling worsening at night with pillow under knees - modify pillow use and also elevate feet. - Follow-up prn, 3 months. May consider referral vascular surgery for further evaluation of venous insufficiency vs lymphedema.   Relevant Medications   Elastic Bandages & Supports (MEDICAL COMPRESSION SOCKS) MISC      Meds ordered this encounter  Medications  . DISCONTD: metoprolol tartrate 37.5 MG TABS    Sig: Take 25 mg by mouth 2 (two) times daily.    Dispense:  60 tablet    Refill:  11    Order Specific Question:   Supervising Provider    Answer:   Olin Hauser [[id number]]  . Elastic Bandages & Supports (MEDICAL COMPRESSION SOCKS) MISC    Sig: 1 Device by Does not apply route daily. Wear daily during day to help reduce leg swelling.  8-15 mmHg    Dispense:  2 each    Refill:  1    Order Specific Question:   Supervising Provider    Answer:   Olin Hauser [[id number]]  . Metoprolol Tartrate 37.5 MG TABS    Sig:  Take 37.5 mg by mouth 2 (two) times daily.    Dispense:  60 tablet    Refill:  11    Order Specific Question:   Supervising Provider    Answer:   Olin Hauser [[id number]]    I have reviewed the admission H&P, hospital notes, discharge summary, discharge medication list, and have reconciled the current and discharge medications today.  Follow up plan: Return in about 2 months (around 10/18/2019) for hypertension.  [name], DNP, AGPCNP-BC Adult Gerontology Primary Care Nurse Practitioner [facility] 08/18/2019, 11:38 AM

## 2019-08-18 NOTE — Patient Instructions (Addendum)
Julie James,   Thank you for coming in to clinic today.  1. Increase metoprolol to 37.5 mg twice daily.  Your new pill will be taken one pill twice daily.   Until you run out of your current supply, you may take 1.5 mg twice daily. - Monitor heart rate.  It should stay above 60 beats per minute.  If it is usually less than 60, please call clinic.  2. Labs today to recheck electrolytes  3. START wearing compression socks to reduce your leg swelling.  Medications will all cause changes in potassium, so these are to be avoided right now.  Please schedule a follow-up appointment with [name], AGNP. Return in about 2 months (around 10/18/2019) for hypertension.  If you have any other questions or concerns, please feel free to call the clinic or send a message through [place]. You may also schedule an earlier appointment if necessary.  You will receive a survey after today's visit either digitally by e-mail or paper by [employer]. Your experiences and feedback matter to [place].  Please respond so we know how we are doing as we provide care for you.   [name], DNP, AGNP-BC Adult Gerontology Nurse Practitioner [facility], [facility]   Chronic Venous Insufficiency Chronic venous insufficiency is a condition where the leg veins cannot effectively pump blood from the legs to the heart. This happens when the vein walls are either stretched, weakened, or damaged, or when the valves inside the vein are damaged. With the right treatment, you should be able to continue with an active life. This condition is also called venous stasis. What are the causes? Common causes of this condition include:  High blood pressure inside the veins (venous hypertension).  Sitting or standing too long, causing increased blood pressure in the leg veins.  A blood clot that blocks blood flow in a vein (deep vein thrombosis, DVT).  Inflammation of a vein (phlebitis) that causes a blood clot  to form.  Tumors in the pelvis that cause blood to back up. What increases the risk? The following factors may make you more likely to develop this condition:  Having a family history of this condition.  Obesity.  Pregnancy.  Living without enough regular physical activity or exercise (sedentary lifestyle).  Smoking.  Having a job that requires long periods of standing or sitting in one place.  Being a certain age. Women in their 63s and 68s and men in their 3s are more likely to develop this condition. What are the signs or symptoms? Symptoms of this condition include:  Veins that are enlarged, bulging, or twisted (varicose veins).  Skin breakdown or ulcers.  Reddened skin or dark discoloration of skin on the leg between the knee and ankle.  Brown, smooth, tight, and painful skin just above the ankle, usually on the inside of the leg (lipodermatosclerosis).  Swelling of the legs. How is this diagnosed? This condition may be diagnosed based on:  Your medical history.  A physical exam.  Tests, such as: ? A procedure that creates an image of a blood vessel and nearby organs and provides information about blood flow through the blood vessel (duplex ultrasound). ? A procedure that tests blood flow (plethysmography). ? A procedure that looks at the veins using X-ray and dye (venogram). How is this treated? The goals of treatment are to help you return to an active life and to minimize pain or disability. Treatment depends on the severity of your condition, and  it may include:  Wearing compression stockings. These can help relieve symptoms and help prevent your condition from getting worse. However, they do not cure the condition.  Sclerotherapy. This procedure involves an injection of a solution that shrinks damaged veins.  Surgery. This may involve: ? Removing a diseased vein (vein stripping). ? Cutting off blood flow through the vein (laser ablation  surgery). ? Repairing or reconstructing a valve within the affected vein. Follow these instructions at home:      Wear compression stockings as told by your health care provider. These stockings help to prevent blood clots and reduce swelling in your legs.  Take over-the-counter and prescription medicines only as told by your health care provider.  Stay active by exercising, walking, or doing different activities. Ask your health care provider what activities are safe for you and how much exercise you need.  Drink enough fluid to keep your urine pale yellow.  Do not use any products that contain nicotine or tobacco, such as cigarettes, e-cigarettes, and chewing tobacco. If you need help quitting, ask your health care provider.  Keep all follow-up visits as told by your health care provider. This is important. Contact a health care provider if you:  Have redness, swelling, or more pain in the affected area.  See a red streak or line that goes up or down from the affected area.  Have skin breakdown or skin loss in the affected area, even if the breakdown is small.  Get an injury in the affected area. Get help right away if:  You get an injury and an open wound in the affected area.  You have: ? Severe pain that does not get better with medicine. ? Sudden numbness or weakness in the foot or ankle below the affected area. ? Trouble moving your foot or ankle. ? A fever. ? Worse or persistent symptoms. ? Chest pain. ? Shortness of breath. Summary  Chronic venous insufficiency is a condition where the leg veins cannot effectively pump blood from the legs to the heart.  Chronic venous insufficiency occurs when the vein walls become stretched, weakened, or damaged, or when valves within the vein are damaged.  Treatment depends on how severe your condition is. It often involves wearing compression stockings and may involve having a procedure.  Make sure you stay active by  exercising, walking, or doing different activities. Ask your health care provider what activities are safe for you and how much exercise you need. This information is not intended to replace advice given to you by your health care provider. Make sure you discuss any questions you have with your health care provider. Document Released: 03/26/2007 Document Revised: 08/13/2018 Document Reviewed: 08/13/2018 Elsevier Patient Education  2020 [employer].

## 2019-08-19 LAB — BASIC METABOLIC PANEL WITH GFR
BUN: 11 mg/dL (ref 7–25)
CO2: 29 mmol/L (ref 20–32)
Calcium: 9.3 mg/dL (ref 8.6–10.4)
Chloride: 108 mmol/L (ref 98–110)
Creat: 0.84 mg/dL (ref 0.50–0.99)
GFR, Est African American: 84 mL/min/{1.73_m2} (ref >=60)
GFR, Est Non African American: 72 mL/min/{1.73_m2} (ref >=60)
Glucose, Bld: 88 mg/dL (ref 65–99)
Potassium: 4.4 mmol/L (ref 3.5–5.3)
Sodium: 141 mmol/L (ref 135–146)

## 2019-08-19 LAB — MAGNESIUM: Magnesium: 1.9 mg/dL (ref 1.5–2.5)

## 2019-08-21 ENCOUNTER — Encounter: Payer: Self-pay | Admitting: Nurse Practitioner

## 2019-08-25 ENCOUNTER — Ambulatory Visit (INDEPENDENT_AMBULATORY_CARE_PROVIDER_SITE_OTHER): Payer: Medicare Other | Admitting: Nurse Practitioner

## 2019-08-25 ENCOUNTER — Other Ambulatory Visit: Payer: Self-pay

## 2019-08-25 ENCOUNTER — Encounter: Payer: Self-pay | Admitting: Nurse Practitioner

## 2019-08-25 VITALS — BP 144/65 | HR 65

## 2019-08-25 DIAGNOSIS — J302 Other seasonal allergic rhinitis: Secondary | ICD-10-CM | POA: Diagnosis not present

## 2019-08-25 NOTE — Progress Notes (Signed)
Telemedicine Encounter: Disclosed to patient at start of encounter that we will provide appropriate telemedicine services.  Patient consents to be treated via phone prior to discussion. - Patient is at her home and is accessed via telephone. - Services are provided by [name] from [facility].  Subjective:    Patient ID: Julie James, female    DOB: 1951-08-14, 68 y.o.   MRN: [id number]  Julie James is a 68 y.o. female presenting on 08/25/2019 for Sinus Problem (post nasal drainage, wheezing during the night that cause her to wake up during the night. x 4 days )  HPI Sinus congestion Symptoms started Thursdsay or Friday (3-4 days ago) with consistent symptoms. Continues to have post nasal drainage, has to clear throat frequently.  - Wakes around 2-4 am with mild wheezing (upper respiratory).   - Denies fever, chills, or sweats, nausea, vomiting, or diarrhea, no significant shortness of breath, sore throat.  - Facial pain is minimal - notes puffiess, no tooth/jaw pain.  No changes in hearing, ear pain/pressure. - More shortness of breath with activity (but with stairs, walking fast).  - Feels mild soreness with swallowing. - Patient does occasionally have fall seasonal allergies.  Brief interval history: Feet - swelling is improving.  Is wearing compression socks when she has increased swelling.  BP - Today 144/65.  Tolerates increased metoprolol dose well.  No dizziness/lightheadedness.  Social History   Tobacco Use  . Smoking status: Never Smoker  . Smokeless tobacco: Never Used  Substance Use Topics  . Alcohol use: No  . Drug use: No    Review of Systems Per HPI unless specifically indicated above     Objective:    BP (!) 144/65 (BP Location: Right Arm, Patient Position: Sitting, Cuff Size: Normal)   Pulse 65   Wt Readings from Last 3 Encounters:  08/18/19 258 lb 12.8 oz (117.4 kg)  08/13/19 245 lb (111.1 kg)  08/08/19 250 lb 4.8 oz  (113.5 kg)    Physical Exam Patient remotely monitored.  Verbal communication appropriate.  Cognition normal.   Results for orders placed or performed in visit on 08/18/19  Magnesium  Result Value Ref Range   Magnesium 1.9 1.5 - 2.5 mg/dL  BASIC METABOLIC PANEL WITH GFR  Result Value Ref Range   Glucose, Bld 88 65 - 99 mg/dL   BUN 11 7 - 25 mg/dL   Creat 0.84 0.50 - 0.99 mg/dL   GFR, Est Non African American 72 > OR = 60 mL/min/1.26m2   GFR, Est African American 84 > OR = 60 mL/min/1.92m2   BUN/Creatinine Ratio NOT APPLICABLE 6 - 22 (calc)   Sodium 141 135 - 146 mmol/L   Potassium 4.4 3.5 - 5.3 mmol/L   Chloride 108 98 - 110 mmol/L   CO2 29 20 - 32 mmol/L   Calcium 9.3 8.6 - 10.4 mg/dL      Assessment & Plan:   Problem List Items Addressed This Visit    None    Visit Diagnoses    Seasonal allergic rhinitis, unspecified trigger    -  Primary     Consistent with allergic rhinitis, seasonal, unknown triggers.  May have viral URI, but symptoms not fully consistent with infection.  No/low risk of Covid-19 given only nasal congestion as positive symptoms. - Today no evidence of acute sinusitis or complication.  - Has not recently used loratadine, cetirizine, flonase  Plan: 1. Start nasal fluticasone 2 sprays each nostril once daily for  4 weeks. 2. START antihistamine cetirizine 10 mg once daily. 3. Can use nasal saline spray for dryness, congestion if needed.  Use at alternate time from fluticasone. 4. START mucinex prn nasal congestion.   5. Follow-up in future, as needed, consider 2nd opinion from ENT/allergy.  - Time spent in direct consultation with patient via telemedicine about above concerns: 10 minutes  Follow up plan: Follow-up 5-7 days prn no improvement or worsening.  [name], DNP, AGPCNP-BC Adult Gerontology Primary Care Nurse Practitioner [facility] 08/25/2019, 11:32 AM

## 2019-09-26 ENCOUNTER — Other Ambulatory Visit: Payer: Self-pay | Admitting: Nurse Practitioner

## 2019-09-26 DIAGNOSIS — E782 Mixed hyperlipidemia: Secondary | ICD-10-CM

## 2019-09-29 ENCOUNTER — Other Ambulatory Visit: Payer: Self-pay

## 2019-09-29 DIAGNOSIS — E782 Mixed hyperlipidemia: Secondary | ICD-10-CM

## 2019-09-29 MED ORDER — SIMVASTATIN 40 MG PO TABS: 40.0000 mg | ORAL_TABLET | Freq: Every day | ORAL | 0 refills | Status: DC

## 2019-10-01 DIAGNOSIS — Z23 Encounter for immunization: Secondary | ICD-10-CM | POA: Diagnosis not present

## 2019-10-21 ENCOUNTER — Ambulatory Visit: Payer: Medicare Other | Admitting: Nurse Practitioner

## 2019-12-13 ENCOUNTER — Emergency Department: Payer: Medicare Other

## 2019-12-13 ENCOUNTER — Emergency Department
Admission: EM | Admit: 2019-12-13 | Discharge: 2019-12-13 | Disposition: A | Discharge status: Home / Self Care | Payer: Medicare Other | Attending: Emergency Medicine | Admitting: Emergency Medicine

## 2019-12-13 ENCOUNTER — Encounter: Payer: Self-pay | Admitting: Emergency Medicine

## 2019-12-13 ENCOUNTER — Other Ambulatory Visit: Payer: Self-pay

## 2019-12-13 DIAGNOSIS — I10 Essential (primary) hypertension: Secondary | ICD-10-CM | POA: Diagnosis not present

## 2019-12-13 DIAGNOSIS — R1011 Right upper quadrant pain: Secondary | ICD-10-CM | POA: Diagnosis not present

## 2019-12-13 DIAGNOSIS — R0602 Shortness of breath: Secondary | ICD-10-CM | POA: Diagnosis not present

## 2019-12-13 DIAGNOSIS — Z79899 Other long term (current) drug therapy: Secondary | ICD-10-CM | POA: Diagnosis not present

## 2019-12-13 DIAGNOSIS — K7689 Other specified diseases of liver: Secondary | ICD-10-CM | POA: Diagnosis not present

## 2019-12-13 DIAGNOSIS — R142 Eructation: Secondary | ICD-10-CM

## 2019-12-13 DIAGNOSIS — K219 Gastro-esophageal reflux disease without esophagitis: Secondary | ICD-10-CM

## 2019-12-13 DIAGNOSIS — R0789 Other chest pain: Secondary | ICD-10-CM | POA: Diagnosis not present

## 2019-12-13 DIAGNOSIS — M549 Dorsalgia, unspecified: Secondary | ICD-10-CM | POA: Diagnosis present

## 2019-12-13 DIAGNOSIS — R1013 Epigastric pain: Secondary | ICD-10-CM

## 2019-12-13 HISTORY — DX: Pure hypercholesterolemia, unspecified: E78.00

## 2019-12-13 LAB — CBC WITH DIFFERENTIAL/PLATELET
Abs Immature Granulocytes: 0.01 10*3/uL (ref 0.00–0.07)
Basophils Absolute: 0 10*3/uL (ref 0.0–0.1)
Basophils Relative: 1 %
Eosinophils Absolute: 0 10*3/uL (ref 0.0–0.5)
Eosinophils Relative: 1 %
HCT: 39.8 % (ref 36.0–46.0)
Hemoglobin: 12.5 g/dL (ref 12.0–15.0)
Immature Granulocytes: 0 %
Lymphocytes Relative: 44 %
Lymphs Abs: 1.9 10*3/uL (ref 0.7–4.0)
MCH: 27.7 pg (ref 26.0–34.0)
MCHC: 31.4 g/dL (ref 30.0–36.0)
MCV: 88.1 fL (ref 80.0–100.0)
Monocytes Absolute: 0.7 10*3/uL (ref 0.1–1.0)
Monocytes Relative: 16 %
Neutro Abs: 1.6 10*3/uL — ABNORMAL LOW (ref 1.7–7.7)
Neutrophils Relative %: 38 %
Platelets: 302 10*3/uL (ref 150–400)
RBC: 4.52 MIL/uL (ref 3.87–5.11)
RDW: 14.3 % (ref 11.5–15.5)
WBC: 4.2 10*3/uL (ref 4.0–10.5)
nRBC: 0 % (ref 0.0–0.2)

## 2019-12-13 LAB — COMPREHENSIVE METABOLIC PANEL
ALT: 18 U/L (ref 0–44)
AST: 22 U/L (ref 15–41)
Albumin: 4.5 g/dL (ref 3.5–5.0)
Alkaline Phosphatase: 64 U/L (ref 38–126)
Anion gap: 9 (ref 5–15)
BUN: 12 mg/dL (ref 8–23)
CO2: 27 mmol/L (ref 22–32)
Calcium: 9.6 mg/dL (ref 8.9–10.3)
Chloride: 103 mmol/L (ref 98–111)
Creatinine, Ser: 0.9 mg/dL (ref 0.44–1.00)
GFR calc Af Amer: >60 mL/min (ref >60)
GFR calc non Af Amer: >60 mL/min (ref >60)
Glucose, Bld: 107 mg/dL — ABNORMAL HIGH (ref 70–99)
Potassium: 4 mmol/L (ref 3.5–5.1)
Sodium: 139 mmol/L (ref 135–145)
Total Bilirubin: 0.7 mg/dL (ref 0.3–1.2)
Total Protein: 7.9 g/dL (ref 6.5–8.1)

## 2019-12-13 LAB — TROPONIN I (HIGH SENSITIVITY): Troponin I (High Sensitivity): 9 ng/L (ref <18)

## 2019-12-13 IMAGING — CR DG CHEST 2V
1 series · 2 of 2 positions shown · non-contrast
Comparison: [DATE]

CLINICAL DATA: Shortness of breath

EXAM:
CHEST - 2 VIEW

[Series 1: dg chest 2 view · 0.14mm/px · 2 of 2 slices shown]
[im 1/2]
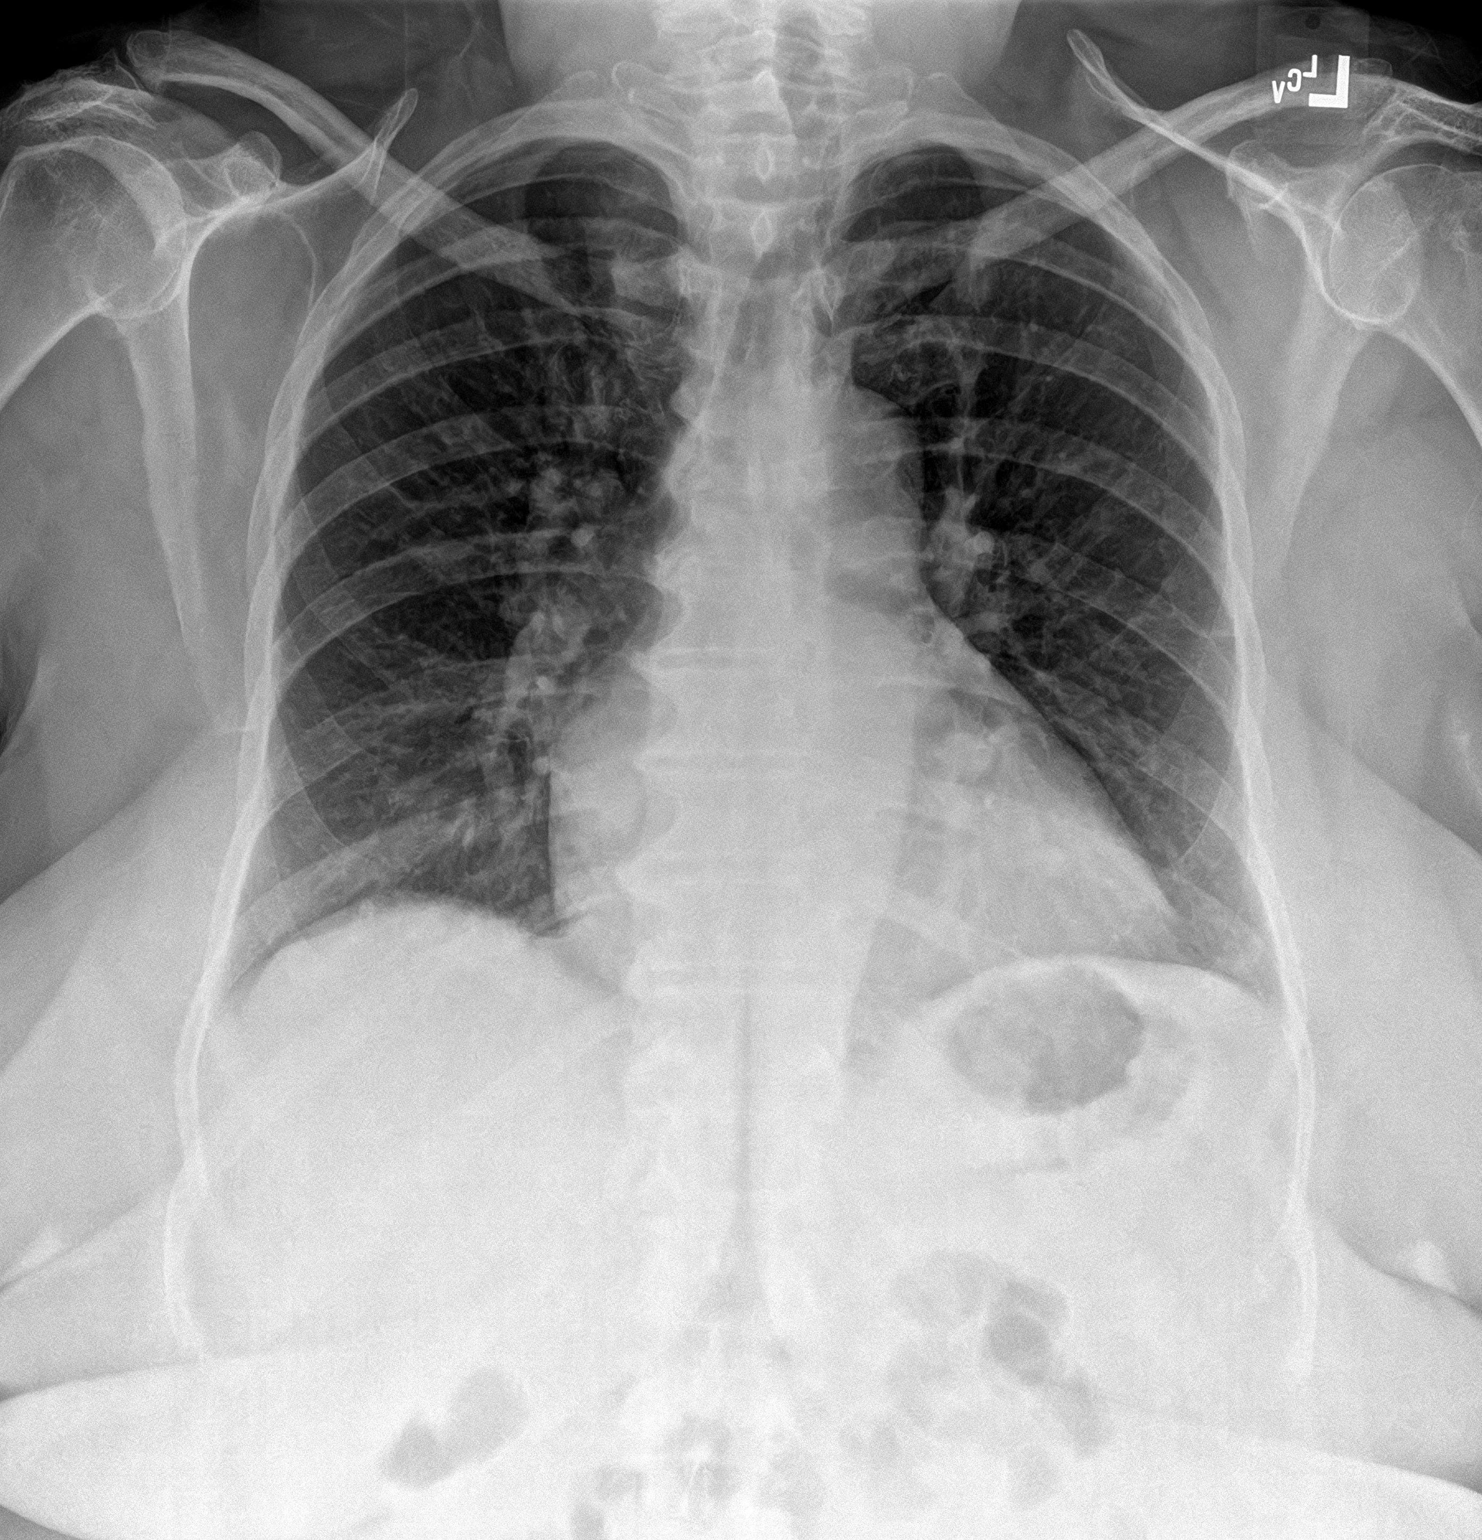
[im 2/2]
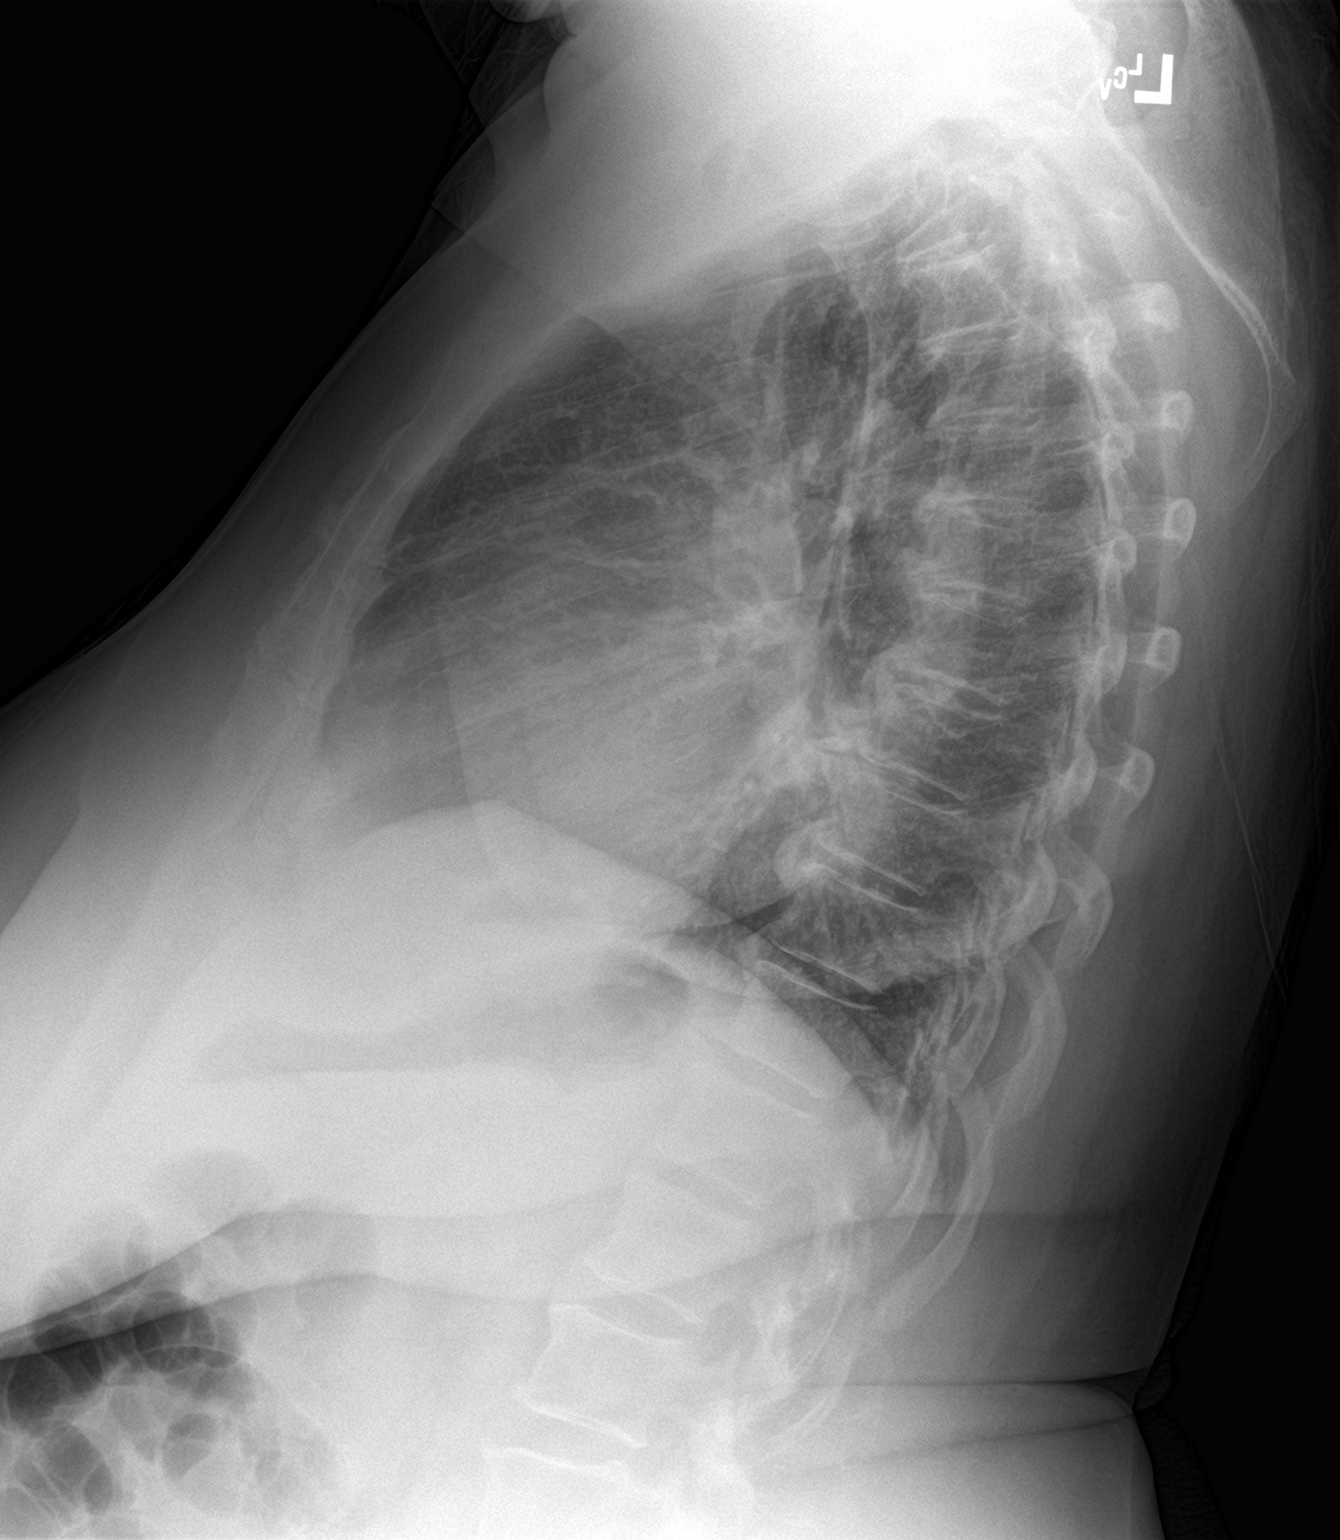

[2 of 2 positions shown; findings below may reference images not displayed]

FINDINGS: Stable cardiomegaly and central vascular prominence. No focal
pneumonia, collapse or consolidation. Negative for edema, effusion
or pneumothorax. Stable left tracheal deviation may represent right
thyroid enlargement. Degenerative changes of the spine. Aorta
atherosclerotic.
IMPRESSION: Stable cardiomegaly with vascular prominence. No interval change or
acute process by plain radiography.

## 2019-12-13 IMAGING — US US ABDOMEN LIMITED
1 series · 14 of 25 positions shown · non-contrast
Comparison: None.

CLINICAL DATA: Epigastric pain.  Burping.

EXAM:
ULTRASOUND ABDOMEN LIMITED RIGHT UPPER QUADRANT

[Series 1: us abdomen limited · 14 of 42 slices shown]
[im 1/42]
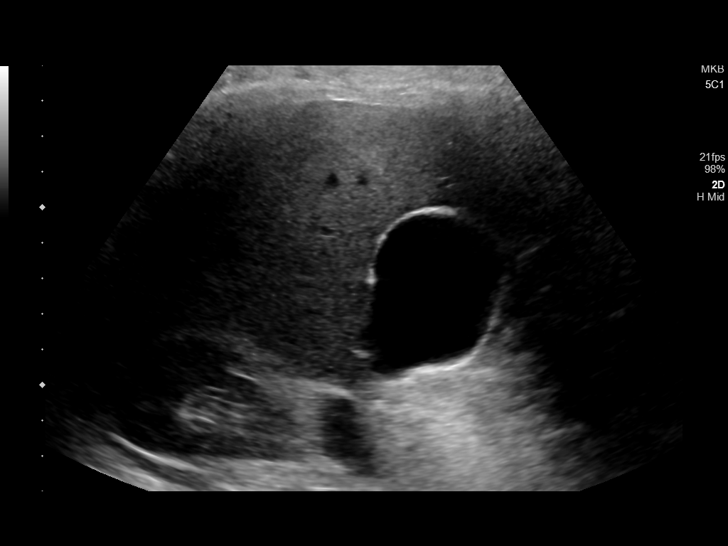
[im 4/42]
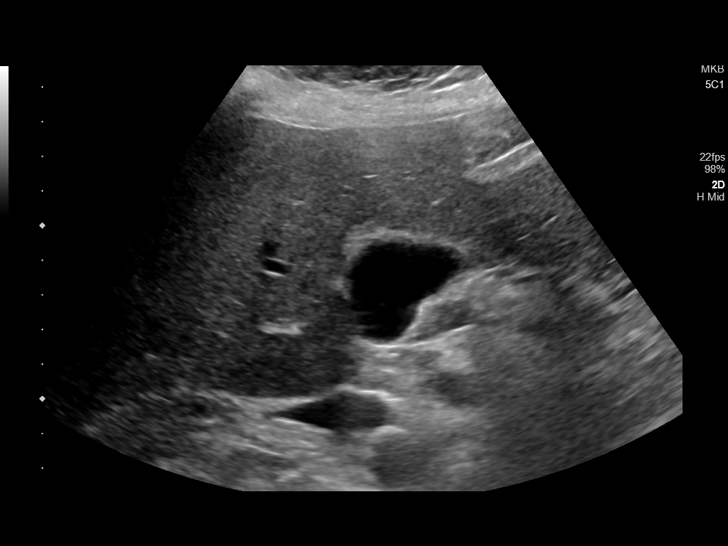
[im 7/42]
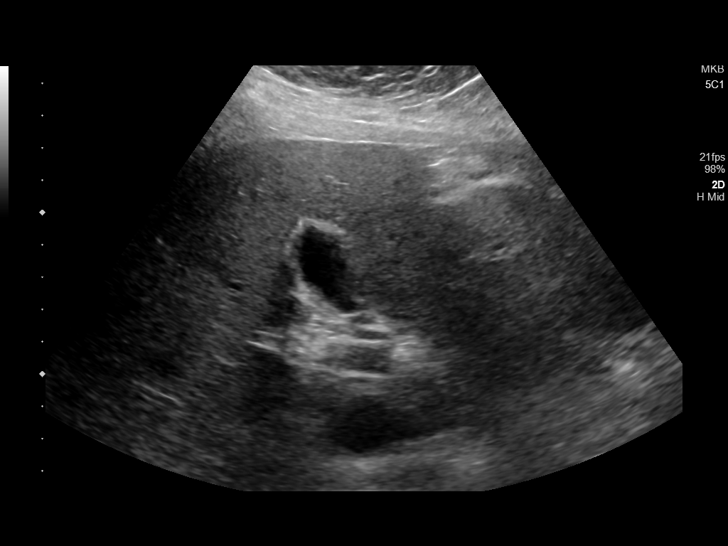
[im 11/42]
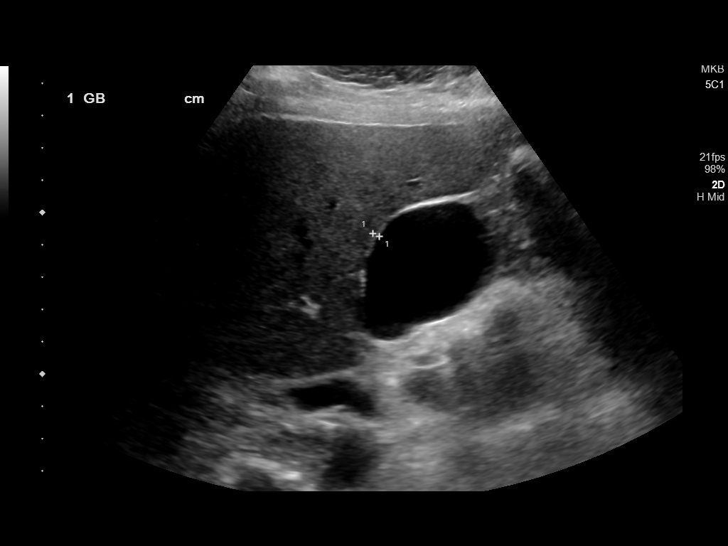
[im 14/42]
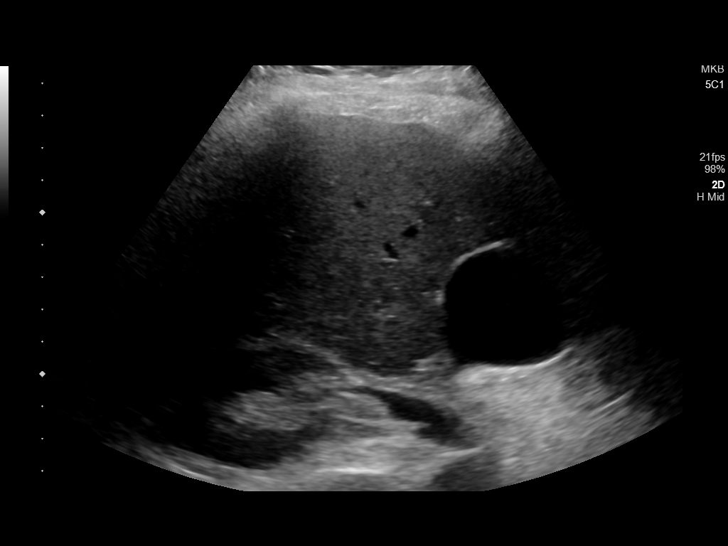
[im 16/42]
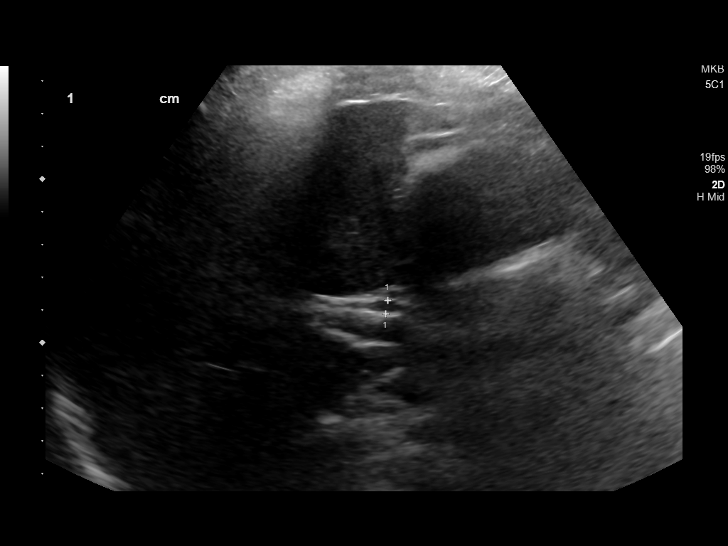
[im 19/42]
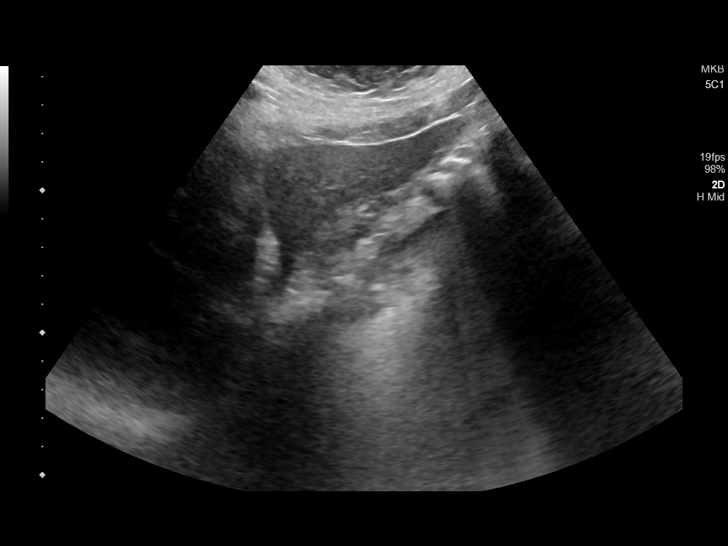
[im 23/42]
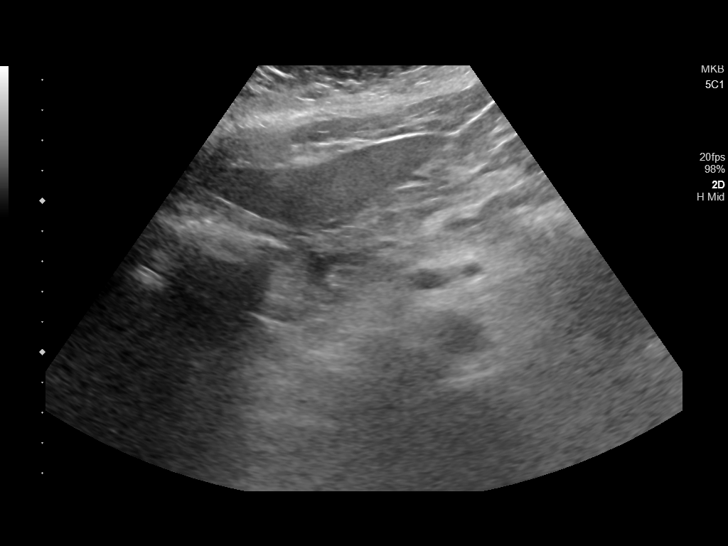
[im 26/42]
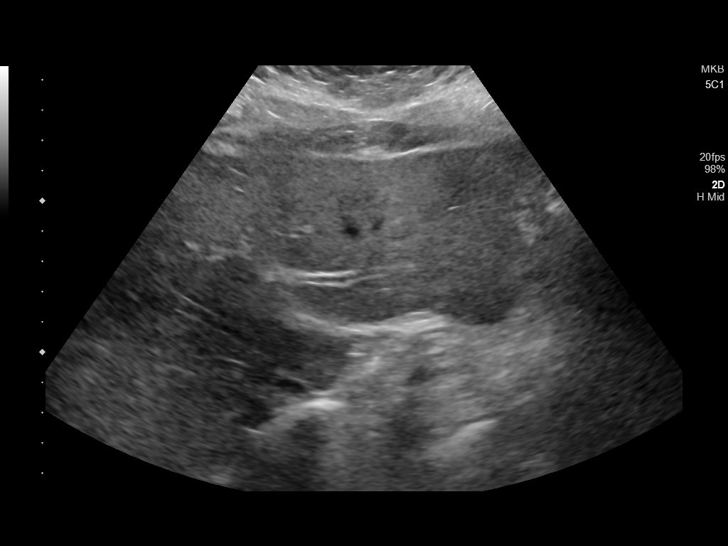
[im 28/42]
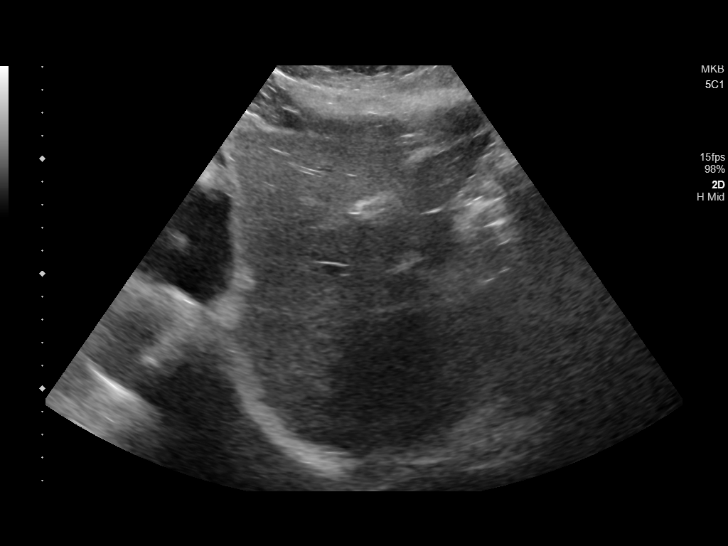
[im 31/42]
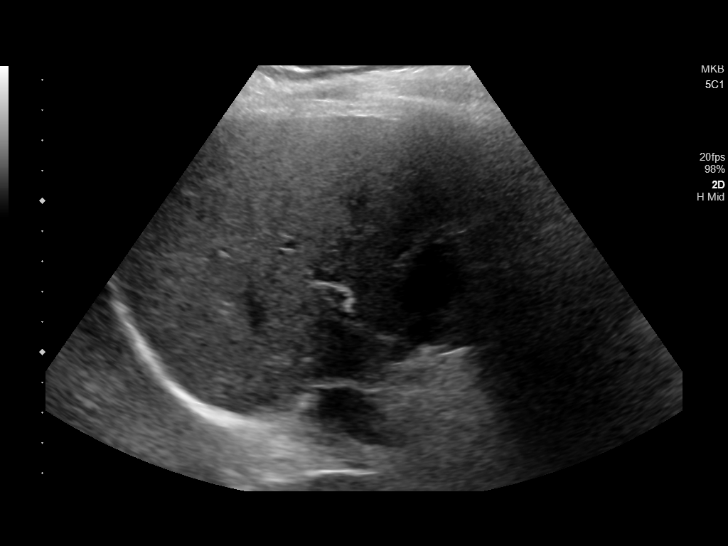
[im 35/42]
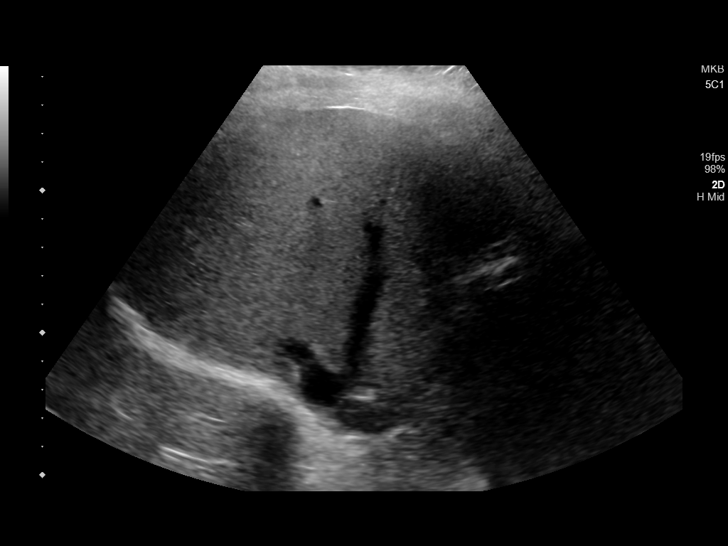
[im 38/42]
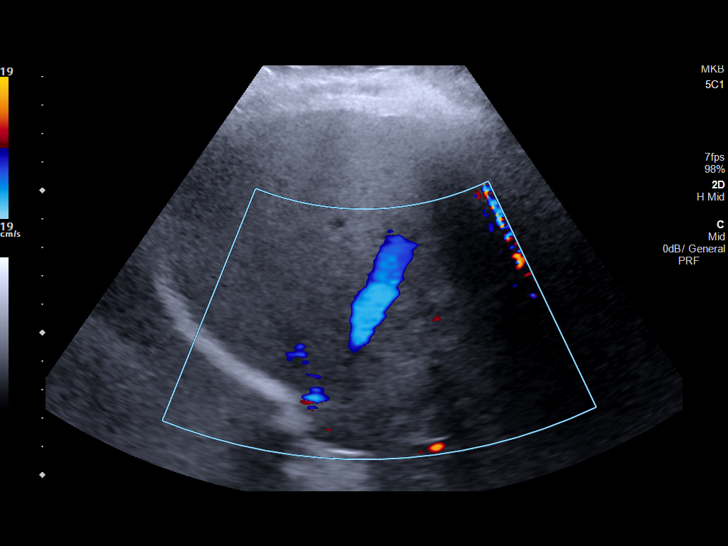
[im 42/42]
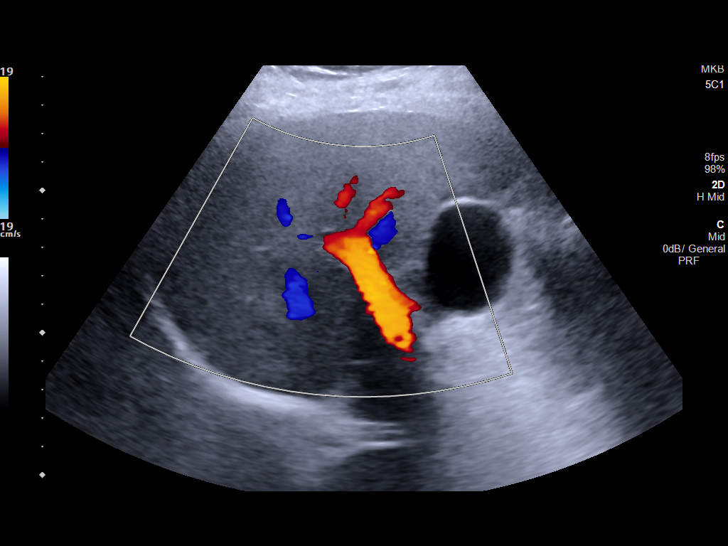

[14 of 25 positions shown; findings below may reference images not displayed]

FINDINGS: Gallbladder:

No gallstones or wall thickening visualized. No sonographic Murphy
sign noted by sonographer.

Common bile duct:

Diameter: 4.2 mm

Liver:

Mild increased echogenicity. No focal mass. Portal vein is patent on
color Doppler imaging with normal direction of blood flow towards
the liver.

Other: None.
IMPRESSION: 1. The gallbladder and common bile duct are normal in appearance.
2. Mild increased echogenicity in the liver is nonspecific but often
seen with hepatic steatosis.

## 2019-12-13 MED ORDER — PANTOPRAZOLE SODIUM 20 MG PO TBEC
20.0000 mg | DELAYED_RELEASE_TABLET | Freq: Once | ORAL | Status: DC
  Filled 2019-12-13: qty 1

## 2019-12-13 MED ORDER — FAMOTIDINE 20 MG PO TABS: 20.0000 mg | ORAL_TABLET | Freq: Two times a day (BID) | ORAL | 0 refills | Status: DC

## 2019-12-13 NOTE — Discharge Instructions (Signed)
Keep your appointment with your primary care provider.  Begin taking Pepcid 20 mg twice a day and read information about food choices with reflux disease.  There was no gallstones seen on your ultrasound.  Your primary care provider may refer you to a gastroenterologist for further work-up.

## 2019-12-13 NOTE — ED Notes (Signed)
Informed the provider that she has had some SOB with exertion and some chest pressure

## 2019-12-13 NOTE — ED Provider Notes (Signed)
[facility] Emergency Department Provider Note  ___________________________________________   First MD Initiated Contact with Patient 12/13/19 1034     (approximate)  I have reviewed the triage vital signs and the nursing notes.   HISTORY  Chief Complaint Back Pain   HPI Julie James is a 69 y.o. female presents to the ED with complaint of some shortness of breath with exertion and chest discomfort.  Patient states that she also has some indigestion yesterday and this morning.  She states that she took Tums and a hot shower and her back and indigestion improved.  She also describes a lot of burping and symptoms of reflux.  Patient denies any trauma, nausea, vomiting, fever, chills or cough.  She denies any known exposure to Covid.  She currently is being treated for hypertension.  She denies any previous cardiac events.  Patient states she has had the symptoms since September when she was last seen in the emergency department.  They have not gotten any worse or better.  She rates her pain as a 2 out of 10.     Past Medical History:  Diagnosis Date  . Hypercholesteremia   . Hypertension     Patient Active Problem List   Diagnosis Date Noted  . Drug-induced hypokalemia 08/07/2019  . Lipoma of abdomen 07/30/2017  . Lipoma of right thigh 07/30/2017  . Hypertension 06/23/2016  . Hyperlipidemia 06/23/2016  . Thyroid nodule 03/16/2014    History reviewed. No pertinent surgical history.  Prior to Admission medications   Medication Sig Start Date End Date Taking? Authorizing Provider  CALCIUM-MAGNESIUM-ZINC PO Take 3 tablets by mouth daily.    [provider]  cyanocobalamin 100 MCG tablet Take 1,000 mcg by mouth daily.    [provider]  Elastic Bandages & Supports (MEDICAL COMPRESSION SOCKS) MISC 1 Device by Does not apply route daily. Wear daily during day to help reduce leg swelling.  8-15 mmHg 08/18/19   Mikey College, NP   famotidine (PEPCID) 20 MG tablet Take 1 tablet (20 mg total) by mouth 2 (two) times daily. 12/13/19 12/12/20  Johnn Hai, PA-C  Metoprolol Tartrate 37.5 MG TABS Take 37.5 mg by mouth 2 (two) times daily. 08/18/19 08/17/20  Mikey College, NP  Multiple Vitamin (MULTIVITAMIN) tablet Take 2 tablets by mouth daily.    [provider]  simvastatin (ZOCOR) 40 MG tablet Take 1 tablet (40 mg total) by mouth at bedtime. 09/29/19   Olin Hauser, DO    Allergies Chlorthalidone  Family History  Problem Relation Age of Onset  . Cancer Sister        breast  . Cancer Sister        breast    Social History Social History   Tobacco Use  . Smoking status: Never Smoker  . Smokeless tobacco: Never Used  Substance Use Topics  . Alcohol use: No  . Drug use: No    Review of Systems Constitutional: No fever/chills Cardiovascular: Denies chest pain. Respiratory: Denies shortness of breath.  Negative for cough. Gastrointestinal: No abdominal pain.  No nausea, no vomiting.  Positive for indigestion and reflux-like symptoms.  No diarrhea.  Genitourinary: Negative for dysuria. Musculoskeletal: Positive for mid back pain. Skin: Negative for rash. Neurological: Negative for headaches, focal weakness or numbness. ___________________________________________   PHYSICAL EXAM:  VITAL SIGNS: ED Triage Vitals [12/13/19 1034]  Enc Vitals Group     BP      Pulse  Resp      Temp      Temp src      SpO2      Weight 245 lb (111.1 kg)     Height 5\' 6"  (1.676 m)     Head Circumference      Peak Flow      Pain Score 2     Pain Loc      Pain Edu?      Excl. in [place]?    Constitutional: Alert and oriented. Well appearing and in no acute distress. Eyes: Conjunctivae are normal.  Head: Atraumatic. Neck: No stridor.   Cardiovascular: Normal rate, regular rhythm. Grossly normal heart sounds.  Good peripheral circulation. Respiratory: Normal respiratory effort.  No  retractions. Lungs CTAB. Gastrointestinal: Soft and nontender. No distention.  Bowel sounds normoactive x4 quadrants.  No point tenderness is noted on exam.  No tenderness is noted on palpation of the epigastric area. Musculoskeletal: Moves upper and lower extremities that any difficulty normal gait was noted. Neurologic:  Normal speech and language. No gross focal neurologic deficits are appreciated. No gait instability. Skin:  Skin is warm, dry and intact. No rash noted. Psychiatric: Mood and affect are normal. Speech and behavior are normal.  ____________________________________________   LABS (all labs ordered are listed, but only abnormal results are displayed)  Labs Reviewed  CBC WITH DIFFERENTIAL/PLATELET - Abnormal; Notable for the following components:      Result Value   Neutro Abs 1.6 (*)    All other components within normal limits  COMPREHENSIVE METABOLIC PANEL - Abnormal; Notable for the following components:   Glucose, Bld 107 (*)    All other components within normal limits  TROPONIN I (HIGH SENSITIVITY)  TROPONIN I (HIGH SENSITIVITY)   ____________________________________________  EKG EKG was reviewed by Dr. Jari Pigg. Normal sinus rhythm with a ventricular rate of 81, PR interval 152, QRS duration 138.  ____________________________________________  RADIOLOGY  Official radiology report(s): DG Chest 2 View  Result Date: 12/13/2019 CLINICAL DATA:  Shortness of breath EXAM: CHEST - 2 VIEW COMPARISON:  08/14/2019 FINDINGS: Stable cardiomegaly and central vascular prominence. No focal pneumonia, collapse or consolidation. Negative for edema, effusion or pneumothorax. Stable left tracheal deviation may represent right thyroid enlargement. Degenerative changes of the spine. Aorta atherosclerotic. IMPRESSION: Stable cardiomegaly with vascular prominence. No interval change or acute process by plain radiography. Electronically Signed   By: Jerilynn Mages.  Shick M.D.   On: 12/13/2019 11:36     US Abdomen Limited RUQ  Result Date: 12/13/2019 CLINICAL DATA:  Epigastric pain.  Burping. EXAM: ULTRASOUND ABDOMEN LIMITED RIGHT UPPER QUADRANT COMPARISON:  None. FINDINGS: Gallbladder: No gallstones or wall thickening visualized. No sonographic Murphy sign noted by sonographer. Common bile duct: Diameter: 4.2 mm Liver: Mild increased echogenicity. No focal mass. Portal vein is patent on color Doppler imaging with normal direction of blood flow towards the liver. Other: None. IMPRESSION: 1. The gallbladder and common bile duct are normal in appearance. 2. Mild increased echogenicity in the liver is nonspecific but often seen with hepatic steatosis. Electronically Signed   By: Dorise Bullion III M.D   On: 12/13/2019 14:13    ____________________________________________   PROCEDURES  Procedure(s) performed (including Critical Care):  Procedures  ____________________________________________   INITIAL IMPRESSION / ASSESSMENT AND PLAN / ED COURSE  As part of my medical decision making, I reviewed the following data within the electronic MEDICAL RECORD NUMBER Notes from prior ED visits and [place] Controlled Substance Database  68 year old female presents to  the ED with complaint of back pain with history of indigestion yesterday and symptoms of reflux.  Patient states she is also burping quite a bit.  She denies any previous cardiac history but records indicate that she has a history of cardiomegaly.  Patient states that she took Tums and a hot shower with some improvement today.  Patient is unaware if this is related to fried foods or spicy foods.  Chest x-ray was unchanged from previous per radiologist, EKG did not show a STEMI and troponin was negative.  Meaning labs were benign.  Ultrasound of the gallbladder did not show any gallstones but did show some hepatic stenosis.  Patient was placed on Pepcid to take twice a day to see if this helps with her indigestion.  She is has a appointment already  scheduled with her PCP for next week.  She is advised to return to the emergency department if you have severe worsening of her symptoms. ____________________________________________   FINAL CLINICAL IMPRESSION(S) / ED DIAGNOSES  Final diagnoses:  Gastroesophageal reflux disease, unspecified whether esophagitis present     ED Discharge Orders         Ordered    famotidine (PEPCID) 20 MG tablet  2 times daily     12/13/19 1422           Note:  This document was prepared using Dragon voice recognition software and may include unintentional dictation errors.    Johnn Hai, PA-C 12/13/19 1532    Lavonia Drafts, MD 12/14/19 1759

## 2019-12-13 NOTE — ED Triage Notes (Signed)
Presents with pain to mid back  States she had some indigestion yesterday   States she took TUMS this am and "hot " shower  States pain is easing off to back   Denies any trauma or n/v

## 2019-12-19 DIAGNOSIS — Z20822 Contact with and (suspected) exposure to covid-19: Secondary | ICD-10-CM | POA: Diagnosis not present

## 2020-01-06 ENCOUNTER — Other Ambulatory Visit: Payer: Self-pay | Admitting: Family Medicine

## 2020-01-06 DIAGNOSIS — E782 Mixed hyperlipidemia: Secondary | ICD-10-CM

## 2020-01-20 DIAGNOSIS — Z23 Encounter for immunization: Secondary | ICD-10-CM | POA: Diagnosis not present

## 2020-02-20 ENCOUNTER — Ambulatory Visit: Payer: Medicare Other | Admitting: Family Medicine

## 2020-03-01 DIAGNOSIS — Z23 Encounter for immunization: Secondary | ICD-10-CM | POA: Diagnosis not present

## 2020-03-12 ENCOUNTER — Other Ambulatory Visit: Payer: Self-pay

## 2020-03-12 ENCOUNTER — Encounter: Payer: Self-pay | Admitting: Family Medicine

## 2020-03-12 ENCOUNTER — Ambulatory Visit (INDEPENDENT_AMBULATORY_CARE_PROVIDER_SITE_OTHER): Payer: Medicare Other | Admitting: Family Medicine

## 2020-03-12 VITALS — BP 173/90 | HR 76 | Temp 96.9°F | Ht 66.0 in | Wt 242.2 lb

## 2020-03-12 DIAGNOSIS — E041 Nontoxic single thyroid nodule: Secondary | ICD-10-CM | POA: Diagnosis not present

## 2020-03-12 DIAGNOSIS — Z1231 Encounter for screening mammogram for malignant neoplasm of breast: Secondary | ICD-10-CM | POA: Diagnosis not present

## 2020-03-12 DIAGNOSIS — Z1159 Encounter for screening for other viral diseases: Secondary | ICD-10-CM | POA: Diagnosis not present

## 2020-03-12 DIAGNOSIS — E782 Mixed hyperlipidemia: Secondary | ICD-10-CM

## 2020-03-12 DIAGNOSIS — Z1211 Encounter for screening for malignant neoplasm of colon: Secondary | ICD-10-CM | POA: Insufficient documentation

## 2020-03-12 DIAGNOSIS — Z13820 Encounter for screening for osteoporosis: Secondary | ICD-10-CM

## 2020-03-12 DIAGNOSIS — I10 Essential (primary) hypertension: Secondary | ICD-10-CM

## 2020-03-12 DIAGNOSIS — R3129 Other microscopic hematuria: Secondary | ICD-10-CM

## 2020-03-12 DIAGNOSIS — R739 Hyperglycemia, unspecified: Secondary | ICD-10-CM

## 2020-03-12 LAB — POCT URINALYSIS DIPSTICK
Bilirubin, UA: NEGATIVE
Glucose, UA: NEGATIVE
Ketones, UA: NEGATIVE
Leukocytes, UA: NEGATIVE
Nitrite, UA: NEGATIVE
Protein, UA: NEGATIVE
Spec Grav, UA: 1.015 (ref 1.010–1.025)
Urobilinogen, UA: 0.2 E.U./dL
pH, UA: 6 (ref 5.0–8.0)

## 2020-03-12 LAB — POCT GLYCOSYLATED HEMOGLOBIN (HGB A1C): Hemoglobin A1C: 5.2 % (ref 4.0–5.6)

## 2020-03-12 MED ORDER — LOSARTAN POTASSIUM 25 MG PO TABS: 25.0000 mg | ORAL_TABLET | Freq: Every day | ORAL | 1 refills | Status: DC

## 2020-03-12 MED ORDER — SIMVASTATIN 40 MG PO TABS: 40.0000 mg | ORAL_TABLET | Freq: Every day | ORAL | 1 refills | Status: DC

## 2020-03-12 NOTE — Progress Notes (Signed)
Subjective:    Patient ID: Julie James, female    DOB: 1951/04/24, 69 y.o.   MRN: [id number]  Julie James is a 69 y.o. female presenting on 03/12/2020 for Hypertension   HPI  Hypertension - She is not checking BP at home or outside of clinic.    - Current medications: metoprolol tartrate 37.5mg  twice daily, tolerating well without side effects - She is not currently symptomatic. - Pt denies headache, lightheadedness, dizziness, changes in vision, chest tightness/pressure, palpitations, leg swelling, sudden loss of speech or loss of consciousness. - She  reports no regular exercise routine. - Her diet is high in salt, high in fat, and high in carbohydrates.   Depression screen [facility] 2/9 08/18/2019 05/17/2018  Decreased Interest 0 0  Down, Depressed, Hopeless 0 0  PHQ - 2 Score 0 0    Social History   Tobacco Use  . Smoking status: Never Smoker  . Smokeless tobacco: Never Used  Substance Use Topics  . Alcohol use: No  . Drug use: No    Review of Systems  Constitutional: Negative.   HENT: Negative.   Eyes: Negative.   Respiratory: Negative.   Cardiovascular: Negative.   Gastrointestinal: Negative.   Endocrine: Negative.   Genitourinary: Negative.   Musculoskeletal: Negative.   Skin: Negative.   Allergic/Immunologic: Negative.   Neurological: Negative.   Hematological: Negative.   Psychiatric/Behavioral: Negative.    Per HPI unless specifically indicated above     Objective:    BP (!) 173/90 (BP Location: Right Arm, Patient Position: Sitting, Cuff Size: Large)   Pulse 76   Temp (!) 96.9 F (36.1 C) (Temporal)   Ht 5\' 6"  (1.676 m)   Wt 242 lb 3.2 oz (109.9 kg)   BMI 39.09 kg/m   Wt Readings from Last 3 Encounters:  03/12/20 242 lb 3.2 oz (109.9 kg)  12/13/19 245 lb (111.1 kg)  08/18/19 258 lb 12.8 oz (117.4 kg)    Physical Exam Vitals reviewed.  Constitutional:      General: She is not in acute distress.    Appearance: Normal appearance.  She is well-groomed. She is obese. She is not ill-appearing or toxic-appearing.  HENT:     Head: Normocephalic.     Right Ear: Tympanic membrane, ear canal and external ear normal. There is no impacted cerumen.     Left Ear: Tympanic membrane, ear canal and external ear normal. There is no impacted cerumen.     Nose:     Comments: Face mask covering nose and mouth in place    Mouth/Throat:     Lips: Pink.     Pharynx: Uvula midline.  Eyes:     General: Lids are normal. Vision grossly intact. No scleral icterus.       Right eye: No discharge.        Left eye: No discharge.     Extraocular Movements: Extraocular movements intact.     Conjunctiva/sclera: Conjunctivae normal.     Pupils: Pupils are equal, round, and reactive to light.  Neck:     Thyroid: No thyroid mass or thyromegaly.  Cardiovascular:     Rate and Rhythm: Normal rate and regular rhythm.     Pulses: Normal pulses.          Dorsalis pedis pulses are 2+ on the right side and 2+ on the left side.       Posterior tibial pulses are 2+ on the right side and 2+ on the left side.  Heart sounds: Normal heart sounds. No murmur. No friction rub. No gallop.   Pulmonary:     Effort: Pulmonary effort is normal. No respiratory distress.     Breath sounds: Normal breath sounds.  Abdominal:     General: Abdomen is flat. Bowel sounds are normal. There is no distension.     Palpations: Abdomen is soft. There is no hepatomegaly, splenomegaly or mass.     Tenderness: There is no abdominal tenderness. There is no guarding or rebound.     Hernia: No hernia is present.  Musculoskeletal:        General: Normal range of motion.     Cervical back: Normal range of motion and neck supple. No tenderness.     Right lower leg: No edema.     Left lower leg: No edema.  Feet:     Right foot:     Skin integrity: Skin integrity normal.     Left foot:     Skin integrity: Skin integrity normal.  Lymphadenopathy:     Cervical: No cervical  adenopathy.  Skin:    General: Skin is warm and dry.     Capillary Refill: Capillary refill takes less than 2 seconds.  Neurological:     General: No focal deficit present.     Mental Status: She is alert and oriented to person, place, and time.     Cranial Nerves: No cranial nerve deficit.     Sensory: No sensory deficit.     Motor: No weakness.     Coordination: Coordination normal.     Gait: Gait normal.     Deep Tendon Reflexes: Reflexes normal.  Psychiatric:        Attention and Perception: Attention and perception normal.        Mood and Affect: Mood and affect normal.        Speech: Speech normal.        Behavior: Behavior normal. Behavior is cooperative.        Thought Content: Thought content normal.        Cognition and Memory: Cognition and memory normal.        Judgment: Judgment normal.     Results for orders placed or performed in visit on 03/12/20  POCT Urinalysis Dipstick  Result Value Ref Range   Color, UA Yellow    Clarity, UA clear    Glucose, UA Negative Negative   Bilirubin, UA negative    Ketones, UA negative    Spec Grav, UA 1.015 1.010 - 1.025   Blood, UA Trace    pH, UA 6.0 5.0 - 8.0   Protein, UA Negative Negative   Urobilinogen, UA 0.2 0.2 or 1.0 E.U./dL   Nitrite, UA Negative    Leukocytes, UA Negative Negative   Appearance     Odor    POCT HgB A1C  Result Value Ref Range   Hemoglobin A1C 5.2 4.0 - 5.6 %   HbA1c POC (<> result, manual entry)     HbA1c, POC (prediabetic range)     HbA1c, POC (controlled diabetic range)        Assessment & Plan:   Problem List Items Addressed This Visit      Cardiovascular and Mediastinum   Hypertension - Primary    Uncontrolled hypertension.  BP is not at goal < 130/80.  Pt is not working on lifestyle modifications.  Taking medications tolerating well without side effects.  Complications include hyperlipidemia and obesity.  Plan: 1. Continue taking metoprolol  tartrate 37.5mg  twice daily and BEGIN  Losartan 25mg  daily 2. Obtain labs ordered today   3. Encouraged heart healthy diet and increasing exercise to 30 minutes most days of the week, going no more than 2 days in a row without exercise. 4. Check BP 1-2 x per day at home, keep log, and bring to clinic at next appointment. 5. Follow up 2 weeks for follow up      Relevant Medications   losartan (COZAAR) 25 MG tablet   simvastatin (ZOCOR) 40 MG tablet   Other Relevant Orders   POCT Urinalysis Dipstick (Completed)   CBC with Differential   COMPLETE METABOLIC PANEL WITH GFR     Endocrine   Thyroid nodule    Will have updated thyroid profile labs completed for re-evaluation as last set of labs were from 2017.  Plan: 1) Have labs drawn today and will contact once results received.      Relevant Orders   Thyroid Panel With TSH     Other   Hyperlipidemia    Status unknown.  Recheck labs.  Continue meds without changes today.  Refills provided. Followup after labs.       Relevant Medications   losartan (COZAAR) 25 MG tablet   simvastatin (ZOCOR) 40 MG tablet   Other Relevant Orders   Lipid Profile   Breast cancer screening by mammogram    Order placed for Mammogram with instructions to contact office to schedule.  Plan: 1. Schedule Mammogram and will contact with the results once received.      Screening for malignant neoplasm of colon    Pt requiring colon cancer screening.  Negative family history of colon cancer.  Plan: - Discussed timing for initiation of colon cancer screening ACS vs USPSTF guidelines - Mutual decision making discussion for options of colonoscopy vs cologuard.  Pt prefers cologuard. - Ordered Cologuard today       Other Visit Diagnoses    Microscopic hematuria       Relevant Orders   Urinalysis, microscopic only   Hyperglycemia       Relevant Orders   POCT HgB A1C (Completed)   Screening for colon cancer       Relevant Orders   Cologuard   Encounter for screening mammogram for  malignant neoplasm of breast       Relevant Orders   MM 3D SCREEN BREAST BILATERAL   Screening for osteoporosis       Encounter for hepatitis C screening test for low risk patient       Relevant Orders   Hepatitis C Antibody      Meds ordered this encounter  Medications  . losartan (COZAAR) 25 MG tablet    Sig: Take 1 tablet (25 mg total) by mouth daily.    Dispense:  30 tablet    Refill:  1  . simvastatin (ZOCOR) 40 MG tablet    Sig: Take 1 tablet (40 mg total) by mouth at bedtime.    Dispense:  90 tablet    Refill:  1      Follow up plan: Return in about 2 weeks (around 03/26/2020) for Hypertension follow up.   Harlin Rain, [place] [facility] Nurse Practitioner [facility] 03/12/2020, 12:14 PM

## 2020-03-12 NOTE — Assessment & Plan Note (Signed)
Pt requiring colon cancer screening.  Negative family history of colon cancer.  Plan: - Discussed timing for initiation of colon cancer screening ACS vs USPSTF guidelines - Mutual decision making discussion for options of colonoscopy vs cologuard.  Pt prefers cologuard. - Ordered Cologuard today

## 2020-03-12 NOTE — Patient Instructions (Addendum)
As we discussed, have your labs drawn in the next 1-2 weeks and we will contact you with the results.  For Mammogram screening for breast cancer   Call the [facility] below anytime to schedule your own appointment now that order has been placed.  [facility] [place], [place] [address] Phone: [phone]  [facility] [address] [place], [place] [address] Phone: [phone]  Colon Cancer Screening: - For all adults age 67 and older, routine colon cancer screening is highly recommended. - Early detection of colon cancer is important, because often there are no warning signs or symptoms.  If colon cancer is found early, usually it can be cured. Advanced cancer is hard to treat.  - If you are not interested in Colonoscopy screening (if done and normal you could be cleared for 5 to 10 years until next due), then Cologuard is an excellent alternative for screening test for Colon Cancer. It is highly sensitive for detecting DNA of colon cancer from even the earliest stages. Also, there is NO bowel prep required. - If Cologuard is NEGATIVE, then it is good for 3 years before next due - If Cologuard is POSITIVE, then it is strongly advised to get a Colonoscopy, which allows the GI doctor to locate the source of the cancer or polyp (even very early stage) and treat it by removing it. ------------------------- If you would like to proceed with Cologuard (stool DNA test) - FIRST, call your insurance company and tell them you want to check cost of Cologuard tell them CPT Code [phone] (it may be completely covered with a small or no cost, OR max cost without any coverage is about $600). If you do NOT open the kit, and decide not to do the test, you will NOT be charged, you should contact the company to return the kit if you decide not to do the test. - If you want to proceed, you can notify us (office phone,  [place], or at next visit) and we will order it for you. The test kit will be delivered to your house in about 1 week. Follow instructions to collect your stool sample.  You may call the company for any help or questions, 24/7 telephone support at [phone].  Try to get exercise a minimum of 30 minutes per day at least 5 days per week as well as  adequate water intake all while measuring blood pressure a few times per week.  Keep a blood pressure log and bring back to clinic at your next visit.  If your readings are consistently over 140/90 to contact our office/send me a MyChart message and we will see you sooner.  Can try DASH and Mediterranean diet options, avoiding processed foods, lowering sodium intake, avoiding pork products, and eating a plant based diet for optimal health.  Education and discussion with patient regarding hypertension as well as the effects on the organs and body.  Specifically, we spoke about kidney disease, kidney failure, heart attack, stroke and up to and including death, as likely outcomes if non-compliant with blood pressure regulation.  Discussed how all of these habits are attached to each other and each has the effect on each other.      Mediterranean Diet  Why follow it? Research shows. . Those who follow the Mediterranean diet have a reduced risk of heart disease  . The diet is associated with a reduced incidence of  Parkinson's and Alzheimer's diseases . People following the diet may have longer life expectancies and lower rates of chronic diseases  . The Dietary Guidelines for Americans recommends the Mediterranean diet as an eating plan to promote health and prevent disease  What Is the Mediterranean Diet?  . Healthy eating plan based on typical foods and recipes of Mediterranean-style cooking . The diet is primarily a plant based diet; these foods should make up a majority of meals   Starches - Plant based foods should make up a majority of  meals - They are an important sources of vitamins, minerals, energy, antioxidants, and fiber - Choose whole grains, foods high in fiber and minimally processed items  - Typical grain sources include wheat, oats, barley, corn, brown rice, bulgar, farro, millet, polenta, couscous  - Various types of beans include chickpeas, lentils, fava beans, black beans, white beans   Fruits  Veggies - Large quantities of antioxidant rich fruits & veggies; 6 or more servings  - Vegetables can be eaten raw or lightly drizzled with oil and cooked  - Vegetables common to the traditional Mediterranean Diet include: artichokes, arugula, beets, broccoli, brussel sprouts, cabbage, carrots, celery, collard greens, cucumbers, eggplant, kale, leeks, lemons, lettuce, mushrooms, okra, onions, peas, peppers, potatoes, pumpkin, radishes, rutabaga, shallots, spinach, sweet potatoes, turnips, zucchini - Fruits common to the Mediterranean Diet include: apples, apricots, avocados, cherries, clementines, dates, figs, grapefruits, grapes, melons, nectarines, oranges, peaches, pears, pomegranates, strawberries, tangerines  Fats - Replace butter and margarine with healthy oils, such as olive oil, canola oil, and tahini  - Limit nuts to no more than a handful a day  - Nuts include walnuts, almonds, pecans, pistachios, pine nuts  - Limit or avoid candied, honey roasted or heavily salted nuts - Olives are central to the [employer] - can be eaten whole or used in a variety of dishes   Meats Protein - Limiting red meat: no more than a few times a month - When eating red meat: choose lean cuts and keep the portion to the size of deck of cards - Eggs: approx. 0 to 4 times a week  - Fish and lean poultry: at least 2 a week  - Healthy protein sources include, chicken, [place], lean beef, lamb - Increase intake of seafood such as tuna, salmon, trout, mackerel, shrimp, scallops - Avoid or limit high fat processed meats such as sausage  and bacon  Dairy - Include moderate amounts of low fat dairy products  - Focus on healthy dairy such as fat free yogurt, skim milk, low or reduced fat cheese - Limit dairy products higher in fat such as whole or 2% milk, cheese, ice cream  Alcohol - Moderate amounts of red wine is ok  - No more than 5 oz daily for women (all ages) and men older than age 65  - No more than 10 oz of wine daily for men younger than 21  Other - Limit sweets and other desserts  - Use herbs and spices instead of salt to flavor foods  - Herbs and spices common to the traditional Mediterranean Diet include: basil, bay leaves, chives, cloves, cumin, fennel, garlic, lavender, marjoram, mint, oregano, parsley, pepper, rosemary, sage, savory, sumac, tarragon, thyme   It's not just a diet, it's a lifestyle:  . The Mediterranean diet includes lifestyle factors typical of those in the region  . Foods, drinks and meals are best eaten with others and savored . Daily physical activity is important for overall  good health . This could be strenuous exercise like running and aerobics . This could also be more leisurely activities such as walking, housework, yard-work, or taking the stairs . Moderation is the key; a balanced and healthy diet accommodates most foods and drinks . Consider portion sizes and frequency of consumption of certain foods   Meal Ideas & Options:  . Breakfast:  o Whole wheat toast or whole wheat English muffins with peanut butter & hard boiled egg o Steel cut oats topped with apples & cinnamon and skim milk  o Fresh fruit: banana, strawberries, melon, berries, peaches  o Smoothies: strawberries, bananas, greek yogurt, peanut butter o Low fat greek yogurt with blueberries and granola  o Egg white omelet with spinach and mushrooms o Breakfast couscous: whole wheat couscous, apricots, skim milk, cranberries  . Sandwiches:  o Hummus and grilled vegetables (peppers, zucchini, squash) on whole wheat bread    o Grilled chicken on whole wheat pita with lettuce, tomatoes, cucumbers or tzatziki  o Tuna salad on whole wheat bread: tuna salad made with greek yogurt, olives, red peppers, capers, green onions o Garlic rosemary lamb pita: lamb sauted with garlic, rosemary, salt & pepper; add lettuce, cucumber, greek yogurt to pita - flavor with lemon juice and black pepper  . Seafood:  o Mediterranean grilled salmon, seasoned with garlic, basil, parsley, lemon juice and black pepper o Shrimp, lemon, and spinach whole-grain pasta salad made with low fat greek yogurt  o Seared scallops with lemon orzo  o Seared tuna steaks seasoned salt, pepper, coriander topped with tomato mixture of olives, tomatoes, olive oil, minced garlic, parsley, green onions and cappers  . Meats:  o Herbed greek chicken salad with kalamata olives, cucumber, feta  o Red bell peppers stuffed with spinach, bulgur, lean ground beef (or lentils) & topped with feta   o Kebabs: skewers of chicken, tomatoes, onions, zucchini, squash  o [place] burgers: made with red onions, mint, dill, lemon juice, feta cheese topped with roasted red peppers . Vegetarian o Cucumber salad: cucumbers, artichoke hearts, celery, red onion, feta cheese, tossed in olive oil & lemon juice  o Hummus and whole grain pita points with a greek salad (lettuce, tomato, feta, olives, cucumbers, red onion) o Lentil soup with celery, carrots made with vegetable broth, garlic, salt and pepper  o Tabouli salad: parsley, bulgur, mint, scallions, cucumbers, tomato, radishes, lemon juice, olive oil, salt and pepper.  We will plan to see you back in 2 weeks for hypertension re-evaluation  You will receive a survey after today's visit either digitally by e-mail or paper by [place] mail. Your experiences and feedback matter to [place].  Please respond so we know how we are doing as we provide care for you.  Call us with any questions/concerns/needs.  It is my goal to be available to you for  your health concerns.  Thanks for choosing me to be a partner in your healthcare needs!  Harlin Rain, FNP-C Family Nurse Practitioner [facility] Phone: [phone]

## 2020-03-12 NOTE — Assessment & Plan Note (Signed)
Will have updated thyroid profile labs completed for re-evaluation as last set of labs were from 2017.  Plan: 1) Have labs drawn today and will contact once results received.

## 2020-03-12 NOTE — Assessment & Plan Note (Signed)
Uncontrolled hypertension.  BP is not at goal < 130/80.  Pt is not working on lifestyle modifications.  Taking medications tolerating well without side effects.  Complications include hyperlipidemia and obesity.  Plan: 1. Continue taking metoprolol tartrate 37.5mg  twice daily and BEGIN Losartan 25mg  daily 2. Obtain labs ordered today   3. Encouraged heart healthy diet and increasing exercise to 30 minutes most days of the week, going no more than 2 days in a row without exercise. 4. Check BP 1-2 x per day at home, keep log, and bring to clinic at next appointment. 5. Follow up 2 weeks for follow up

## 2020-03-12 NOTE — Assessment & Plan Note (Signed)
Status unknown.  Recheck labs.  Continue meds without changes today.  Refills provided. Followup after labs.  

## 2020-03-12 NOTE — Assessment & Plan Note (Signed)
Order placed for Mammogram with instructions to contact office to schedule.  Plan: 1. Schedule Mammogram and will contact with the results once received.

## 2020-03-15 LAB — CBC WITH DIFFERENTIAL/PLATELET
Absolute Monocytes: 400 cells/uL (ref 200–950)
Basophils Absolute: 39 cells/uL (ref 0–200)
Basophils Relative: 0.9 %
Eosinophils Absolute: 82 cells/uL (ref 15–500)
Eosinophils Relative: 1.9 %
HCT: 35.3 % (ref 35.0–45.0)
Hemoglobin: 11.1 g/dL — ABNORMAL LOW (ref 11.7–15.5)
Lymphs Abs: 2017 cells/uL (ref 850–3900)
MCH: 28.1 pg (ref 27.0–33.0)
MCHC: 31.4 g/dL — ABNORMAL LOW (ref 32.0–36.0)
MCV: 89.4 fL (ref 80.0–100.0)
MPV: 11.4 fL (ref 7.5–12.5)
Monocytes Relative: 9.3 %
Neutro Abs: 1763 cells/uL (ref 1500–7800)
Neutrophils Relative %: 41 %
Platelets: 258 10*3/uL (ref 140–400)
RBC: 3.95 10*6/uL (ref 3.80–5.10)
RDW: 13.6 % (ref 11.0–15.0)
Total Lymphocyte: 46.9 %
WBC: 4.3 10*3/uL (ref 3.8–10.8)

## 2020-03-15 LAB — URINALYSIS, MICROSCOPIC ONLY
Bacteria, UA: NONE SEEN /HPF
Hyaline Cast: NONE SEEN /LPF

## 2020-03-15 LAB — LIPID PANEL
Cholesterol: 166 mg/dL (ref <200)
HDL: 47 mg/dL — ABNORMAL LOW (ref >=50)
LDL Cholesterol (Calc): 100 mg/dL (calc) — ABNORMAL HIGH
Non-HDL Cholesterol (Calc): 119 mg/dL (calc) (ref <130)
Total CHOL/HDL Ratio: 3.5 (calc) (ref <5.0)
Triglycerides: 99 mg/dL (ref <150)

## 2020-03-15 LAB — COMPLETE METABOLIC PANEL WITH GFR
AG Ratio: 1.6 (calc) (ref 1.0–2.5)
ALT: 12 U/L (ref 6–29)
AST: 15 U/L (ref 10–35)
Albumin: 4.2 g/dL (ref 3.6–5.1)
Alkaline phosphatase (APISO): 61 U/L (ref 37–153)
BUN: 13 mg/dL (ref 7–25)
CO2: 27 mmol/L (ref 20–32)
Calcium: 9.6 mg/dL (ref 8.6–10.4)
Chloride: 104 mmol/L (ref 98–110)
Creat: 0.79 mg/dL (ref 0.50–0.99)
GFR, Est African American: 90 mL/min/{1.73_m2} (ref >=60)
GFR, Est Non African American: 78 mL/min/{1.73_m2} (ref >=60)
Globulin: 2.7 g/dL (calc) (ref 1.9–3.7)
Glucose, Bld: 95 mg/dL (ref 65–99)
Potassium: 4.5 mmol/L (ref 3.5–5.3)
Sodium: 139 mmol/L (ref 135–146)
Total Bilirubin: 0.6 mg/dL (ref 0.2–1.2)
Total Protein: 6.9 g/dL (ref 6.1–8.1)

## 2020-03-15 LAB — THYROID PANEL WITH TSH
Free Thyroxine Index: 1.7 (ref 1.4–3.8)
T3 Uptake: 29 % (ref 22–35)
T4, Total: 5.8 ug/dL (ref 5.1–11.9)
TSH: 3.2 mIU/L (ref 0.40–4.50)

## 2020-03-15 LAB — HEPATITIS C ANTIBODY
Hepatitis C Ab: NONREACTIVE
SIGNAL TO CUT-OFF: 0.05 (ref <1.00)

## 2020-03-15 NOTE — Progress Notes (Signed)
Labs are all within normal limits.  Hep C antibody is negative.    Can you ask her if she scheduled her Mammogram yet?  Thanks

## 2020-03-15 NOTE — Progress Notes (Signed)
Labs are within normal limits.  Awaiting Hep C antibody testing.

## 2020-03-17 ENCOUNTER — Other Ambulatory Visit: Payer: Self-pay | Admitting: Family Medicine

## 2020-03-17 DIAGNOSIS — I10 Essential (primary) hypertension: Secondary | ICD-10-CM

## 2020-03-17 MED ORDER — LOSARTAN POTASSIUM 50 MG PO TABS: 50.0000 mg | ORAL_TABLET | Freq: Every day | ORAL | 0 refills | Status: DC

## 2020-03-17 NOTE — Progress Notes (Signed)
Did she start the Losartan 25mg  yet?  She has continued to take the Metoprolol as well?  If she has started the Losartan and has continued to take the Metoprolol, I will increase to Losartan 50mg  and we will still keep our follow up visit for 2 weeks.  Thanks

## 2020-03-26 ENCOUNTER — Ambulatory Visit (INDEPENDENT_AMBULATORY_CARE_PROVIDER_SITE_OTHER): Payer: Medicare Other | Admitting: Family Medicine

## 2020-03-26 ENCOUNTER — Encounter: Payer: Self-pay | Admitting: Family Medicine

## 2020-03-26 ENCOUNTER — Other Ambulatory Visit: Payer: Self-pay

## 2020-03-26 VITALS — BP 164/73 | HR 85 | Temp 96.9°F | Ht 66.0 in | Wt 245.6 lb

## 2020-03-26 DIAGNOSIS — I10 Essential (primary) hypertension: Secondary | ICD-10-CM

## 2020-03-26 DIAGNOSIS — Z780 Asymptomatic menopausal state: Secondary | ICD-10-CM

## 2020-03-26 DIAGNOSIS — Z13820 Encounter for screening for osteoporosis: Secondary | ICD-10-CM | POA: Diagnosis not present

## 2020-03-26 DIAGNOSIS — Z79899 Other long term (current) drug therapy: Secondary | ICD-10-CM

## 2020-03-26 DIAGNOSIS — R3129 Other microscopic hematuria: Secondary | ICD-10-CM | POA: Diagnosis not present

## 2020-03-26 LAB — POCT URINALYSIS DIPSTICK
Bilirubin, UA: NEGATIVE
Glucose, UA: NEGATIVE
Ketones, UA: NEGATIVE
Leukocytes, UA: NEGATIVE
Nitrite, UA: NEGATIVE
Protein, UA: NEGATIVE
Spec Grav, UA: 1.015 (ref 1.010–1.025)
Urobilinogen, UA: 0.2 E.U./dL
pH, UA: 5 (ref 5.0–8.0)

## 2020-03-26 MED ORDER — AMLODIPINE BESYLATE 10 MG PO TABS: 10.0000 mg | ORAL_TABLET | Freq: Every day | ORAL | 1 refills | Status: DC

## 2020-03-26 MED ORDER — METOPROLOL SUCCINATE ER 50 MG PO TB24: 50.0000 mg | ORAL_TABLET | Freq: Every day | ORAL | 1 refills | Status: DC

## 2020-03-26 NOTE — Progress Notes (Signed)
Subjective:    Patient ID: Julie James, female    DOB: 1951/03/22, 69 y.o.   MRN: [id number]  Tahlia Trabue is a 69 y.o. female presenting on 03/26/2020 for Hypertension   HPI   Hypertension - She is checking BP at home or outside of clinic.  Readings 160's/70's - Current medications: Losartan 50mg  daily and Metoprolol 37.5mg  twice daily, tolerating well without side effects - She is not currently symptomatic. - Pt denies headache, lightheadedness, dizziness, changes in vision, chest tightness/pressure, palpitations, leg swelling, sudden loss of speech or loss of consciousness. - She  reports no regular exercise routine. - Her diet is high in salt, high in fat, and high in carbohydrates.   Depression screen [facility] 2/9 08/18/2019 05/17/2018  Decreased Interest 0 0  Down, Depressed, Hopeless 0 0  PHQ - 2 Score 0 0    Social History   Tobacco Use  . Smoking status: Never Smoker  . Smokeless tobacco: Never Used  Substance Use Topics  . Alcohol use: No  . Drug use: No    Review of Systems  Constitutional: Negative.   HENT: Negative.   Eyes: Negative.   Respiratory: Negative.   Cardiovascular: Negative.   Gastrointestinal: Negative.   Endocrine: Negative.   Genitourinary: Negative.   Musculoskeletal: Negative.   Skin: Negative.   Allergic/Immunologic: Negative.   Neurological: Negative.   Hematological: Negative.   Psychiatric/Behavioral: Negative.    Per HPI unless specifically indicated above     Objective:    BP (!) 164/73 (BP Location: Left Arm, Patient Position: Sitting, Cuff Size: Large)   Pulse 85   Temp (!) 96.9 F (36.1 C) (Temporal)   Ht 5\' 6"  (1.676 m)   Wt 245 lb 9.6 oz (111.4 kg)   SpO2 100%   BMI 39.64 kg/m   Wt Readings from Last 3 Encounters:  03/26/20 245 lb 9.6 oz (111.4 kg)  03/12/20 242 lb 3.2 oz (109.9 kg)  12/13/19 245 lb (111.1 kg)    Physical Exam Vitals reviewed.  Constitutional:      General: She is not in acute  distress.    Appearance: Normal appearance. She is well-developed and well-groomed. She is obese. She is not ill-appearing or toxic-appearing.  HENT:     Head: Normocephalic.  Eyes:     General: Lids are normal. Vision grossly intact.        Right eye: No discharge.        Left eye: No discharge.     Extraocular Movements: Extraocular movements intact.     Conjunctiva/sclera: Conjunctivae normal.     Pupils: Pupils are equal, round, and reactive to light.  Cardiovascular:     Rate and Rhythm: Normal rate and regular rhythm.     Pulses: Normal pulses.          Dorsalis pedis pulses are 2+ on the right side and 2+ on the left side.       Posterior tibial pulses are 2+ on the right side and 2+ on the left side.     Heart sounds: Normal heart sounds. No murmur. No friction rub. No gallop.   Pulmonary:     Effort: Pulmonary effort is normal. No respiratory distress.     Breath sounds: Normal breath sounds.  Musculoskeletal:     Right lower leg: No edema.     Left lower leg: No edema.  Feet:     Right foot:     Skin integrity: Skin integrity normal.  Left foot:     Skin integrity: Skin integrity normal.  Skin:    General: Skin is warm and dry.     Capillary Refill: Capillary refill takes less than 2 seconds.  Neurological:     General: No focal deficit present.     Mental Status: She is alert and oriented to person, place, and time.     Cranial Nerves: No cranial nerve deficit.     Sensory: No sensory deficit.     Motor: No weakness.     Coordination: Coordination normal.     Gait: Gait normal.  Psychiatric:        Attention and Perception: Attention and perception normal.        Mood and Affect: Mood and affect normal.        Speech: Speech normal.        Behavior: Behavior normal. Behavior is cooperative.        Thought Content: Thought content normal.        Cognition and Memory: Cognition and memory normal.        Judgment: Judgment normal.     Results for orders  placed or performed in visit on 03/26/20  POCT Urinalysis Dipstick  Result Value Ref Range   Color, UA Yellow    Clarity, UA clear    Glucose, UA Negative Negative   Bilirubin, UA Negative    Ketones, UA Negative    Spec Grav, UA 1.015 1.010 - 1.025   Blood, UA Trace    pH, UA 5.0 5.0 - 8.0   Protein, UA Negative Negative   Urobilinogen, UA 0.2 0.2 or 1.0 E.U./dL   Nitrite, UA Negative    Leukocytes, UA Negative Negative   Appearance     Odor        Assessment & Plan:   Problem List Items Addressed This Visit      Cardiovascular and Mediastinum   Hypertension - Primary    Uncontrolled hypertension.  BP is not at goal < 130/80.  Pt is working on lifestyle modifications.  Taking medications tolerating well without side effects. Complications include hyperlipidemia and obesity.  Plan: 1. Continue taking Losartan 50mg  daily.  CHANGE Metopolol 37.5mg  twice daily to Metoprolol XR 50mg  once daily AND BEGIN Amlodipine 10mg  daily 2. Encouraged heart healthy diet and increasing exercise to 30 minutes most days of the week, going no more than 2 days in a row without exercise. 3. Check BP 1-2 x per day at home, keep log, and bring to clinic at next appointment. 4. Follow up 1 month, or sooner, should the need arise.         Relevant Medications   metoprolol succinate (TOPROL XL) 50 MG 24 hr tablet   amLODipine (NORVASC) 10 MG tablet   Other Relevant Orders   POCT Urinalysis Dipstick (Completed)    Other Visit Diagnoses    Microscopic hematuria       Relevant Orders   Urinalysis, microscopic only   Screening for osteoporosis       Relevant Orders   DG Bone Density   Long-term use of high-risk medication       Relevant Orders   DG Bone Density   Postmenopausal       Relevant Orders   DG Bone Density      Meds ordered this encounter  Medications  . metoprolol succinate (TOPROL XL) 50 MG 24 hr tablet    Sig: Take 1 tablet (50 mg total) by mouth daily. Take  with or  immediately following a meal.    Dispense:  30 tablet    Refill:  1  . amLODipine (NORVASC) 10 MG tablet    Sig: Take 1 tablet (10 mg total) by mouth daily.    Dispense:  90 tablet    Refill:  1      Follow up plan: Return in about 4 weeks (around 04/23/2020) for HTN follow up.   Harlin Rain, [place] Family Nurse Practitioner [facility] 03/26/2020, 10:14 AM

## 2020-03-26 NOTE — Patient Instructions (Addendum)
Try to get exercise a minimum of 30 minutes per day at least 5 days per week as well as  adequate water intake all while measuring blood pressure a few times per week.  Keep a blood pressure log and bring back to clinic at your next visit.  If your readings are consistently over 140/90 to contact our office/send me a MyChart message and we will see you sooner.  Can try DASH and Mediterranean diet options, avoiding processed foods, lowering sodium intake, avoiding pork products, and eating a plant based diet for optimal health.  Education and discussion with patient regarding hypertension as well as the effects on the organs and body.  Specifically, we spoke about kidney disease, kidney failure, heart attack, stroke and up to and including death, as likely outcomes if non-compliant with blood pressure regulation.  Discussed how all of these habits are attached to each other and each has the effect on each other.  Continue taking Losartan 50mg  daily  I have switched your Metoprolol 37.5 mg twice a day TO Metoprolol XR 50mg  ONCE PER DAY  I have added Amlodipine 10mg  daily ONCE per day  We will plan to see you back in 1 month for hypertension  For Mammogram screening for breast cancer and DEXA Scan (Bone mineral density) screening for osteoporosis  Call the [place] below anytime to schedule your own appointment now that order has been placed.  [facility] [place], [place] [address] Phone: [phone]  [facility] [address] [place], [place] [address] Phone: [phone]   You will receive a survey after today's visit either digitally by e-mail or paper by [place] mail. Your experiences and feedback matter to [place].  Please respond so we know how we are doing as we provide care for you.  Call us with any questions/concerns/needs.  It is my goal to be available to you for your health concerns.  Thanks for  choosing me to be a partner in your healthcare needs!  Harlin Rain, FNP-C Family Nurse Practitioner [facility] Phone: [phone]

## 2020-03-26 NOTE — Assessment & Plan Note (Signed)
Uncontrolled hypertension.  BP is not at goal < 130/80.  Pt is working on lifestyle modifications.  Taking medications tolerating well without side effects. Complications include hyperlipidemia and obesity.  Plan: 1. Continue taking Losartan 50mg  daily.  CHANGE Metopolol 37.5mg  twice daily to Metoprolol XR 50mg  once daily AND BEGIN Amlodipine 10mg  daily 2. Encouraged heart healthy diet and increasing exercise to 30 minutes most days of the week, going no more than 2 days in a row without exercise. 3. Check BP 1-2 x per day at home, keep log, and bring to clinic at next appointment. 4. Follow up 1 month, or sooner, should the need arise.

## 2020-03-27 LAB — URINALYSIS, MICROSCOPIC ONLY
Bacteria, UA: NONE SEEN /HPF
Hyaline Cast: NONE SEEN /LPF
RBC / HPF: NONE SEEN /HPF (ref 0–2)
Squamous Epithelial / HPF: NONE SEEN /HPF (ref <=5)
WBC, UA: NONE SEEN /HPF (ref 0–5)

## 2020-04-02 ENCOUNTER — Telehealth: Payer: Self-pay

## 2020-04-02 ENCOUNTER — Other Ambulatory Visit: Payer: Self-pay | Admitting: Family Medicine

## 2020-04-02 DIAGNOSIS — I10 Essential (primary) hypertension: Secondary | ICD-10-CM

## 2020-04-02 MED ORDER — LOSARTAN POTASSIUM 50 MG PO TABS: 75.0000 mg | ORAL_TABLET | Freq: Every day | ORAL | 0 refills | Status: DC

## 2020-04-02 NOTE — Telephone Encounter (Signed)
I spoke with the patient and she informed me that she would like to keep monitoring her blood pressure for another week before you increase the blood pressure medication. She will record her reading and give [place] a call back. I informed the patient if her blood pressure reading start to increase or she doesn't see any changes to give [place] a call back sooner. She verbalize understanding.

## 2020-04-02 NOTE — Progress Notes (Signed)
Increased Losartan to 75mg  daily from 50mg  daily due to reported increased blood pressure readings > 140-150 SBP/70-90's DBP

## 2020-04-02 NOTE — Telephone Encounter (Signed)
Copied from [place] [id number]. Topic: General - Other >> Apr 02, 2020  9:30 AM Oneta Rack wrote: Relation to pt: self   Call back number: [phone]    Reason for call:  Patient wanted to inform PCP of her morning BP readings after a light meal. Patient would like to know PCP thoughts and would like a follow up call today 4/25 BP 153/81 4/26 BP 158/874/27 BP 143/73 4/28 BP 146/734/29 BP 140/704/30 BP 139/67

## 2020-04-02 NOTE — Telephone Encounter (Signed)
She has an appointment on 04/23/2020 with me.  We can increase her Losartan to 75mg  daily and have her recheck her blood pressures daily.  We want her to be under 130/80.  Can we have her scheduled for a virtual in 1-2 weeks to see what her new BP is with the increase in Losartan.  I will send in the new Rx to her pharmacy.

## 2020-04-02 NOTE — Telephone Encounter (Signed)
Attempted to contact the patient, no answer. LMOM to return my call.

## 2020-04-02 NOTE — Telephone Encounter (Signed)
Patient called back she was informed that a new Rx was sent to the pharmacy but she states that she is on two other BP medication and will not increase her medication because her BP in the morning was 139/67 and fear that the increased dose will cause her BP to drop down too low and cause harm. Asking for a call back please Ph#  [phone]

## 2020-04-23 ENCOUNTER — Ambulatory Visit: Payer: Medicare Other | Admitting: Family Medicine

## 2020-05-21 ENCOUNTER — Other Ambulatory Visit: Payer: Self-pay

## 2020-05-21 ENCOUNTER — Ambulatory Visit (INDEPENDENT_AMBULATORY_CARE_PROVIDER_SITE_OTHER): Payer: Medicare Other | Admitting: Family Medicine

## 2020-05-21 ENCOUNTER — Encounter: Payer: Self-pay | Admitting: Family Medicine

## 2020-05-21 VITALS — BP 159/72 | HR 78 | Temp 97.5°F | Ht 66.0 in | Wt 251.0 lb

## 2020-05-21 DIAGNOSIS — E782 Mixed hyperlipidemia: Secondary | ICD-10-CM | POA: Diagnosis not present

## 2020-05-21 DIAGNOSIS — I10 Essential (primary) hypertension: Secondary | ICD-10-CM

## 2020-05-21 MED ORDER — LOSARTAN POTASSIUM 50 MG PO TABS: 75.0000 mg | ORAL_TABLET | Freq: Every day | ORAL | 0 refills | Status: DC

## 2020-05-21 MED ORDER — METOPROLOL SUCCINATE ER 50 MG PO TB24: 50.0000 mg | ORAL_TABLET | Freq: Every day | ORAL | 1 refills | Status: DC

## 2020-05-21 MED ORDER — SIMVASTATIN 40 MG PO TABS: 40.0000 mg | ORAL_TABLET | Freq: Every day | ORAL | 1 refills | Status: DC

## 2020-05-21 MED ORDER — AMLODIPINE BESYLATE 5 MG PO TABS: 5.0000 mg | ORAL_TABLET | Freq: Every day | ORAL | 3 refills | Status: DC

## 2020-05-21 NOTE — Assessment & Plan Note (Signed)
Uncontrolled hypertension.  BP is not at goal < 130/80.  Pt is working on lifestyle modifications.  Taking medications tolerating well but since beginning amlodipine 10mg  has been having bilateral foot swelling.  Will decrease amlodipine to 5mg  and re-evaluate leg swelling.  Discussed with patient, having difficulty getting her blood pressure under 130/80 and would encourage cardiology referral for assistance with management of blood pressure.  Patient in agreement.  Plan: 1. Continue taking Losartan 75mg  daily, metoprolol succinate (Toprol XL) 50mg  daily.  STOP Amlodipine 10mg  and begin taking 5mg  daily.  2. Referral to cardiology placed 3. Encouraged heart healthy diet and increasing exercise to 30 minutes most days of the week, going no more than 2 days in a row without exercise. 4. Check BP 1-2 x per week at home, keep log, and bring to clinic at next appointment. 5. Follow up 3 months.

## 2020-05-21 NOTE — Patient Instructions (Signed)
As we discussed, I have reduced your amlodipine from 10mg  to 5mg  due to your ankle swelling.  A referral to Cardiology has been placed today, for some assistance with getting your blood pressure under 130/80.  If you have not heard from the specialty office or our referral coordinator within 1 week, please let us know and we will follow up with the referral coordinator for an update.  Try to get exercise a minimum of 30 minutes per day at least 5 days per week as well as  adequate water intake all while measuring blood pressure a few times per week.  Keep a blood pressure log and bring back to clinic at your next visit.  If your readings are consistently over 130/80 to contact our office/send me a MyChart message and we will see you sooner.  Can try DASH and Mediterranean diet options, avoiding processed foods, lowering sodium intake, avoiding pork products, and eating a plant based diet for optimal health.      Mediterranean Diet  Why follow it? Research shows. . Those who follow the Mediterranean diet have a reduced risk of heart disease  . The diet is associated with a reduced incidence of Parkinson's and Alzheimer's diseases . People following the diet may have longer life expectancies and lower rates of chronic diseases  . The Dietary Guidelines for Americans recommends the Mediterranean diet as an eating plan to promote health and prevent disease  What Is the Mediterranean Diet?  . Healthy eating plan based on typical foods and recipes of Mediterranean-style cooking . The diet is primarily a plant based diet; these foods should make up a majority of meals   Starches - Plant based foods should make up a majority of meals - They are an important sources of vitamins, minerals, energy, antioxidants, and fiber - Choose whole grains, foods high in fiber and minimally processed items  - Typical grain sources include wheat, oats, barley, corn, brown rice, bulgar, farro, millet, polenta, couscous   - Various types of beans include chickpeas, lentils, fava beans, black beans, white beans   Fruits  Veggies - Large quantities of antioxidant rich fruits & veggies; 6 or more servings  - Vegetables can be eaten raw or lightly drizzled with oil and cooked  - Vegetables common to the traditional Mediterranean Diet include: artichokes, arugula, beets, broccoli, brussel sprouts, cabbage, carrots, celery, collard greens, cucumbers, eggplant, kale, leeks, lemons, lettuce, mushrooms, okra, onions, peas, peppers, potatoes, pumpkin, radishes, rutabaga, shallots, spinach, sweet potatoes, turnips, zucchini - Fruits common to the Mediterranean Diet include: apples, apricots, avocados, cherries, clementines, dates, figs, grapefruits, grapes, melons, nectarines, oranges, peaches, pears, pomegranates, strawberries, tangerines  Fats - Replace butter and margarine with healthy oils, such as olive oil, canola oil, and tahini  - Limit nuts to no more than a handful a day  - Nuts include walnuts, almonds, pecans, pistachios, pine nuts  - Limit or avoid candied, honey roasted or heavily salted nuts - Olives are central to the [employer] - can be eaten whole or used in a variety of dishes   Meats Protein - Limiting red meat: no more than a few times a month - When eating red meat: choose lean cuts and keep the portion to the size of deck of cards - Eggs: approx. 0 to 4 times a week  - Fish and lean poultry: at least 2 a week  - Healthy protein sources include, chicken, [place], lean beef, lamb - Increase intake of seafood such as  tuna, salmon, trout, mackerel, shrimp, scallops - Avoid or limit high fat processed meats such as sausage and bacon  Dairy - Include moderate amounts of low fat dairy products  - Focus on healthy dairy such as fat free yogurt, skim milk, low or reduced fat cheese - Limit dairy products higher in fat such as whole or 2% milk, cheese, ice cream  Alcohol - Moderate amounts of red wine  is ok  - No more than 5 oz daily for women (all ages) and men older than age 9  - No more than 10 oz of wine daily for men younger than 32  Other - Limit sweets and other desserts  - Use herbs and spices instead of salt to flavor foods  - Herbs and spices common to the traditional Mediterranean Diet include: basil, bay leaves, chives, cloves, cumin, fennel, garlic, lavender, marjoram, mint, oregano, parsley, pepper, rosemary, sage, savory, sumac, tarragon, thyme   It's not just a diet, it's a lifestyle:  . The Mediterranean diet includes lifestyle factors typical of those in the region  . Foods, drinks and meals are best eaten with others and savored . Daily physical activity is important for overall good health . This could be strenuous exercise like running and aerobics . This could also be more leisurely activities such as walking, housework, yard-work, or taking the stairs . Moderation is the key; a balanced and healthy diet accommodates most foods and drinks . Consider portion sizes and frequency of consumption of certain foods   Meal Ideas & Options:  . Breakfast:  o Whole wheat toast or whole wheat English muffins with peanut butter & hard boiled egg o Steel cut oats topped with apples & cinnamon and skim milk  o Fresh fruit: banana, strawberries, melon, berries, peaches  o Smoothies: strawberries, bananas, greek yogurt, peanut butter o Low fat greek yogurt with blueberries and granola  o Egg white omelet with spinach and mushrooms o Breakfast couscous: whole wheat couscous, apricots, skim milk, cranberries  . Sandwiches:  o Hummus and grilled vegetables (peppers, zucchini, squash) on whole wheat bread   o Grilled chicken on whole wheat pita with lettuce, tomatoes, cucumbers or tzatziki  o Tuna salad on whole wheat bread: tuna salad made with greek yogurt, olives, red peppers, capers, green onions o Garlic rosemary lamb pita: lamb sauted with garlic, rosemary, salt & pepper; add  lettuce, cucumber, greek yogurt to pita - flavor with lemon juice and black pepper  . Seafood:  o Mediterranean grilled salmon, seasoned with garlic, basil, parsley, lemon juice and black pepper o Shrimp, lemon, and spinach whole-grain pasta salad made with low fat greek yogurt  o Seared scallops with lemon orzo  o Seared tuna steaks seasoned salt, pepper, coriander topped with tomato mixture of olives, tomatoes, olive oil, minced garlic, parsley, green onions and cappers  . Meats:  o Herbed greek chicken salad with kalamata olives, cucumber, feta  o Red bell peppers stuffed with spinach, bulgur, lean ground beef (or lentils) & topped with feta   o Kebabs: skewers of chicken, tomatoes, onions, zucchini, squash  o [place] burgers: made with red onions, mint, dill, lemon juice, feta cheese topped with roasted red peppers . Vegetarian o Cucumber salad: cucumbers, artichoke hearts, celery, red onion, feta cheese, tossed in olive oil & lemon juice  o Hummus and whole grain pita points with a greek salad (lettuce, tomato, feta, olives, cucumbers, red onion) o Lentil soup with celery, carrots made with vegetable broth, garlic,  salt and pepper  o Tabouli salad: parsley, bulgur, mint, scallions, cucumbers, tomato, radishes, lemon juice, olive oil, salt and pepper.  We will plan to see you back in 3 months for hypertension re-evaluation  You will receive a survey after today's visit either digitally by e-mail or paper by [place] [facility] mail. Your experiences and feedback matter to [place].  Please respond so we know how we are doing as we provide care for you.  Call us with any questions/concerns/needs.  It is my goal to be available to you for your health concerns.  Thanks for choosing me to be a partner in your healthcare needs!  Harlin Rain, FNP-C Family Nurse Practitioner [facility] Phone: [phone]

## 2020-05-21 NOTE — Progress Notes (Signed)
Subjective:    Patient ID: Julie James, female    DOB: 1951-01-06, 69 y.o.   MRN: [id number]  Julie James is a 69 y.o. female presenting on 05/21/2020 for Foot Swelling (bilateral mild swelling in feet w/ no pain unless you apply pressure or bend the foot. The swelling doesn't worsen as the day progress and improves at after rest. ) and Hypertension (bp ranging 128/148-64/70)   HPI  Julie James presents to clinic for evaluation of bilateral foot swelling without pain, injury, trauma or difficulty with ambulation.  Reports swelling is constant and does not worsen as the day progresses.  Reports her blood pressure readings have been ranging from [phone].  Has been taking medication as directed.  Reports foot swelling has been present for a few weeks, is unsure of exactly when it started.  Depression screen [facility] 2/9 08/18/2019 05/17/2018  Decreased Interest 0 0  Down, Depressed, Hopeless 0 0  PHQ - 2 Score 0 0    Social History   Tobacco Use  . Smoking status: Never Smoker  . Smokeless tobacco: Never Used  Vaping Use  . Vaping Use: Never used  Substance Use Topics  . Alcohol use: No  . Drug use: No    Review of Systems  Constitutional: Negative.   HENT: Negative.   Eyes: Negative.   Respiratory: Negative.   Cardiovascular: Positive for leg swelling. Negative for chest pain and palpitations.  Gastrointestinal: Negative.   Endocrine: Negative.   Genitourinary: Negative.   Musculoskeletal: Negative.   Skin: Negative.   Allergic/Immunologic: Negative.   Neurological: Negative.   Hematological: Negative.   Psychiatric/Behavioral: Negative.    Per HPI unless specifically indicated above     Objective:    BP (!) 159/72 (BP Location: Left Arm, Patient Position: Sitting, Cuff Size: Large)   Pulse 78   Temp (!) 97.5 F (36.4 C) (Temporal)   Ht 5\' 6"  (1.676 m)   Wt 251 lb (113.9 kg)   BMI 40.51 kg/m   Wt Readings from Last 3 Encounters:  05/21/20 251  lb (113.9 kg)  03/26/20 245 lb 9.6 oz (111.4 kg)  03/12/20 242 lb 3.2 oz (109.9 kg)    Physical Exam Vitals reviewed.  Constitutional:      General: She is not in acute distress.    Appearance: Normal appearance. She is well-developed and well-groomed. She is obese. She is not ill-appearing or toxic-appearing.  HENT:     Head: Normocephalic.     Nose:     Comments: Julie James is in place, covering mouth and nose  Eyes:     General: Lids are normal. Vision grossly intact.        Right eye: No discharge.        Left eye: No discharge.     Extraocular Movements: Extraocular movements intact.     Conjunctiva/sclera: Conjunctivae normal.     Pupils: Pupils are equal, round, and reactive to light.  Cardiovascular:     Rate and Rhythm: Normal rate and regular rhythm.     Pulses: Normal pulses.          Dorsalis pedis pulses are 2+ on the right side and 2+ on the left side.     Heart sounds: Normal heart sounds. No murmur heard.  No friction rub. No gallop.   Pulmonary:     Effort: Pulmonary effort is normal. No respiratory distress.     Breath sounds: Normal breath sounds.  Musculoskeletal:     Right lower leg:  1+ Edema present.     Left lower leg: 1+ Edema present.  Feet:     Right foot:     Skin integrity: Skin integrity normal.     Toenail Condition: Fungal disease present.    Left foot:     Skin integrity: Skin integrity normal.     Toenail Condition: Fungal disease present. Skin:    General: Skin is warm and dry.     Capillary Refill: Capillary refill takes less than 2 seconds.  Neurological:     General: No focal deficit present.     Mental Status: She is alert and oriented to person, place, and time.     Cranial Nerves: No cranial nerve deficit.     Sensory: No sensory deficit.     Motor: No weakness.     Coordination: Coordination normal.     Gait: Gait normal.  Psychiatric:        Attention and Perception: Attention and perception normal.        Mood and Affect:  Mood and affect normal.        Speech: Speech normal.        Behavior: Behavior normal. Behavior is cooperative.        Thought Content: Thought content normal.        Cognition and Memory: Cognition and memory normal.        Judgment: Judgment normal.    Results for orders placed or performed in visit on 03/26/20  POCT Urinalysis Dipstick  Result Value Ref Range   Color, UA Yellow    Clarity, UA clear    Glucose, UA Negative Negative   Bilirubin, UA Negative    Ketones, UA Negative    Spec Grav, UA 1.015 1.010 - 1.025   Blood, UA Trace    pH, UA 5.0 5.0 - 8.0   Protein, UA Negative Negative   Urobilinogen, UA 0.2 0.2 or 1.0 E.U./dL   Nitrite, UA Negative    Leukocytes, UA Negative Negative   Appearance     Odor        Assessment & Plan:   Problem List Items Addressed This Visit      Cardiovascular and Mediastinum   Hypertension - Primary    Uncontrolled hypertension.  BP is not at goal < 130/80.  Pt is working on lifestyle modifications.  Taking medications tolerating well but since beginning amlodipine 10mg  has been having bilateral foot swelling.  Will decrease amlodipine to 5mg  and re-evaluate leg swelling.  Discussed with patient, having difficulty getting her blood pressure under 130/80 and would encourage cardiology referral for assistance with management of blood pressure.  Patient in agreement.  Plan: 1. Continue taking Losartan 75mg  daily, metoprolol succinate (Toprol XL) 50mg  daily.  STOP Amlodipine 10mg  and begin taking 5mg  daily.  2. Referral to cardiology placed 3. Encouraged heart healthy diet and increasing exercise to 30 minutes most days of the week, going no more than 2 days in a row without exercise. 4. Check BP 1-2 x per week at home, keep log, and bring to clinic at next appointment. 5. Follow up 3 months.         Relevant Medications   amLODipine (NORVASC) 5 MG tablet   losartan (COZAAR) 50 MG tablet   metoprolol succinate (TOPROL XL) 50 MG 24 hr  tablet   simvastatin (ZOCOR) 40 MG tablet   Other Relevant Orders   Ambulatory referral to Cardiology     Other   Hyperlipidemia   Relevant  Medications   amLODipine (NORVASC) 5 MG tablet   losartan (COZAAR) 50 MG tablet   metoprolol succinate (TOPROL XL) 50 MG 24 hr tablet   simvastatin (ZOCOR) 40 MG tablet      Meds ordered this encounter  Medications  . amLODipine (NORVASC) 5 MG tablet    Sig: Take 1 tablet (5 mg total) by mouth daily.    Dispense:  90 tablet    Refill:  3  . losartan (COZAAR) 50 MG tablet    Sig: Take 1.5 tablets (75 mg total) by mouth daily.    Dispense:  135 tablet    Refill:  0  . metoprolol succinate (TOPROL XL) 50 MG 24 hr tablet    Sig: Take 1 tablet (50 mg total) by mouth daily. Take with or immediately following a meal.    Dispense:  90 tablet    Refill:  1  . simvastatin (ZOCOR) 40 MG tablet    Sig: Take 1 tablet (40 mg total) by mouth at bedtime.    Dispense:  90 tablet    Refill:  1      Follow up plan: Return in about 3 months (around 08/21/2020) for Hypertension re-evaluation.   Harlin Rain, [place] Family Nurse Practitioner [facility] 05/21/2020, 10:09 AM

## 2020-08-23 ENCOUNTER — Ambulatory Visit (INDEPENDENT_AMBULATORY_CARE_PROVIDER_SITE_OTHER): Payer: Medicare Other | Admitting: Family Medicine

## 2020-08-23 ENCOUNTER — Encounter: Payer: Self-pay | Admitting: Family Medicine

## 2020-08-23 ENCOUNTER — Other Ambulatory Visit: Payer: Self-pay | Admitting: Family Medicine

## 2020-08-23 ENCOUNTER — Other Ambulatory Visit: Payer: Self-pay

## 2020-08-23 VITALS — BP 144/69 | HR 75 | Temp 98.2°F | Resp 18 | Ht 66.0 in | Wt 254.0 lb

## 2020-08-23 DIAGNOSIS — I10 Essential (primary) hypertension: Secondary | ICD-10-CM | POA: Diagnosis not present

## 2020-08-23 DIAGNOSIS — Z23 Encounter for immunization: Secondary | ICD-10-CM | POA: Diagnosis not present

## 2020-08-23 DIAGNOSIS — K219 Gastro-esophageal reflux disease without esophagitis: Secondary | ICD-10-CM | POA: Diagnosis not present

## 2020-08-23 DIAGNOSIS — E782 Mixed hyperlipidemia: Secondary | ICD-10-CM | POA: Diagnosis not present

## 2020-08-23 NOTE — Assessment & Plan Note (Signed)
Stable and well controlled with simvastatin 40mg  daily.  Plan: 1) Labs in 6 months 2) Continue simvastatin 40mg  daily  3) Heart healthy diet and to exercise every other day for 30 minutes per day, going no more than 2 days in a row without exercise. 4) We will see you back in 6 months

## 2020-08-23 NOTE — Assessment & Plan Note (Signed)
Pt > age 69.  Needs annual influenza vaccine.  VIS provided.  Plan: 1. Administer high dose fluzone today.  

## 2020-08-23 NOTE — Assessment & Plan Note (Signed)
Uncontrolled hypertension.  BP is not at goal < 130/80.  Pt is not working on lifestyle modifications.  Taking medications tolerating well without side effects.  Encouraged cardiology referral, patient declines. Complications:  Morbid obesity, GERD, HLD  Plan: 1. Continue taking amlodipine 5mg  daily, losartan 50mg  daily, and metoprolol succinate 50mg  daily  2. Obtain labs at next visit  3. Encouraged heart healthy diet and increasing exercise to 30 minutes most days of the week, going no more than 2 days in a row without exercise. 4. Check BP 1-2 x per week at home, keep log, and bring to clinic at next appointment. 5. Follow up 6 months.

## 2020-08-23 NOTE — Progress Notes (Signed)
Subjective:    Patient ID: Julie James, female    DOB: [birth year], 69 y.o.   MRN: [id number]  Julie James is a 69 y.o. female presenting on 08/23/2020 for Hypertension  Mr. Sindelar presents to clinic for follow up on her hypertension, hyperlipidemia, GERD and need for an influenza vaccine.  HPI   Julie James presents to clinic for hypertension follow up visit.  Reports that her blood pressure has been elevated at 140's/60-70's.  Had not heard from the cardiology clinic that a referral was placed for at last visit.   Hypertension - She is checking BP at home or outside of clinic.  Readings 140's/60-70's - Current medications: amlodipine 5mg  daily, losartan 50mg  daily, metoprolol succinate 50mg  daily, tolerating well without side effects - She is not currently symptomatic. - Julie James denies headache, lightheadedness, dizziness, changes in vision, chest tightness/pressure, palpitations, leg swelling, sudden loss of speech or loss of consciousness. - She  reports no regular exercise routine. - Her diet is high in salt, high in fat, and high in carbohydrates.   Depression screen [facility] 2/9 08/23/2020 08/18/2019 05/17/2018  Decreased Interest 0 0 0  Down, Depressed, Hopeless 0 0 0  PHQ - 2 Score 0 0 0    Social History   Tobacco Use  . Smoking status: Never Smoker  . Smokeless tobacco: Never Used  Vaping Use  . Vaping Use: Never used  Substance Use Topics  . Alcohol use: No  . Drug use: No    Review of Systems  Constitutional: Negative.   HENT: Negative.   Eyes: Negative.   Respiratory: Negative.   Cardiovascular: Negative.   Gastrointestinal: Negative.   Endocrine: Negative.   Genitourinary: Negative.   Musculoskeletal: Negative.   Skin: Negative.   Allergic/Immunologic: Negative.   Neurological: Negative.   Hematological: Negative.   Psychiatric/Behavioral: Negative.    Per HPI unless specifically indicated above     Objective:    BP (!) 144/69 (BP  Location: Left Arm, Patient Position: Sitting, Cuff Size: Large)   Pulse 75   Temp 98.2 F (36.8 C) (Oral)   Resp 18   Ht 5\' 6"  (1.676 m)   Wt 254 lb (115.2 kg)   SpO2 98%   BMI 41.00 kg/m   Wt Readings from Last 3 Encounters:  08/23/20 254 lb (115.2 kg)  05/21/20 251 lb (113.9 kg)  03/26/20 245 lb 9.6 oz (111.4 kg)    Physical Exam Vitals reviewed.  Constitutional:      General: She is not in acute distress.    Appearance: Normal appearance. She is well-developed and well-groomed. She is not ill-appearing or toxic-appearing.  HENT:     Head: Normocephalic and atraumatic.     Nose:     Comments: Lizbeth Bark is in place, covering mouth and nose. Eyes:     General: Lids are normal. Vision grossly intact.        Right eye: No discharge.        Left eye: No discharge.     Extraocular Movements: Extraocular movements intact.     Conjunctiva/sclera: Conjunctivae normal.     Pupils: Pupils are equal, round, and reactive to light.  Cardiovascular:     Rate and Rhythm: Normal rate and regular rhythm.     Pulses: Normal pulses.          Dorsalis pedis pulses are 2+ on the right side and 2+ on the left side.     Heart sounds: Normal heart sounds. No murmur heard.  No friction rub. No gallop.   Pulmonary:     Effort: Pulmonary effort is normal. No respiratory distress.     Breath sounds: Normal breath sounds.  Musculoskeletal:     Right lower leg: No edema.     Left lower leg: No edema.  Skin:    General: Skin is warm and dry.     Capillary Refill: Capillary refill takes less than 2 seconds.  Neurological:     General: No focal deficit present.     Mental Status: She is alert and oriented to person, place, and time.  Psychiatric:        Attention and Perception: Attention and perception normal.        Mood and Affect: Mood and affect normal.        Speech: Speech normal.        Behavior: Behavior normal. Behavior is cooperative.        Thought Content: Thought content normal.         Cognition and Memory: Cognition and memory normal.        Judgment: Judgment normal.    Results for orders placed or performed in visit on 03/26/20  POCT Urinalysis Dipstick  Result Value Ref Range   Color, UA Yellow    Clarity, UA clear    Glucose, UA Negative Negative   Bilirubin, UA Negative    Ketones, UA Negative    Spec Grav, UA 1.015 1.010 - 1.025   Blood, UA Trace    pH, UA 5.0 5.0 - 8.0   Protein, UA Negative Negative   Urobilinogen, UA 0.2 0.2 or 1.0 E.U./dL   Nitrite, UA Negative    Leukocytes, UA Negative Negative   Appearance     Odor        Assessment & Plan:   Problem List Items Addressed This Visit      Cardiovascular and Mediastinum   Hypertension - Primary    Uncontrolled hypertension.  BP is not at goal < 130/80.  Julie James is not working on lifestyle modifications.  Taking medications tolerating well without side effects.  Encouraged cardiology referral, patient declines. Complications:  Morbid obesity, GERD, HLD  Plan: 1. Continue taking amlodipine 5mg  daily, losartan 50mg  daily, and metoprolol succinate 50mg  daily  2. Obtain labs at next visit  3. Encouraged heart healthy diet and increasing exercise to 30 minutes most days of the week, going no more than 2 days in a row without exercise. 4. Check BP 1-2 x per week at home, keep log, and bring to clinic at next appointment. 5. Follow up 6 months.           Digestive   GERD (gastroesophageal reflux disease)    Currently well controlled, was on famotidine 20mg  twice daily, reports is not taking medication and without symptoms.  Plan: 1. Continue lifestyle modifications.  2. Avoid diet triggers. Reviewed need to seek care if globus sensation, difficulty swallowing, s/sx of GI bleed. 3. Follow up as needed and in 6 months.         Other   Hyperlipidemia    Stable and well controlled with simvastatin 40mg  daily.  Plan: 1) Labs in 6 months 2) Continue simvastatin 40mg  daily  3) Heart  healthy diet and to exercise every other day for 30 minutes per day, going no more than 2 days in a row without exercise. 4) We will see you back in 6 months       Needs flu shot  Julie James > age 71.  Needs annual influenza vaccine.  VIS provided.  Plan: 1. Administer high dose fluzone today.       Relevant Orders   Flu Vaccine QUAD High Dose(Fluad) (Completed)      No orders of the defined types were placed in this encounter.   Follow up plan: Return in about 6 months (around 02/20/2021) for HTN F/U.   Harlin Rain, [place] [facility] Nurse Practitioner [facility] 08/23/2020, 12:03 PM

## 2020-08-23 NOTE — Assessment & Plan Note (Signed)
Currently well controlled, was on famotidine 20mg  twice daily, reports is not taking medication and without symptoms.  Plan: 1. Continue lifestyle modifications.  2. Avoid diet triggers. Reviewed need to seek care if globus sensation, difficulty swallowing, s/sx of GI bleed. 3. Follow up as needed and in 6 months.

## 2020-08-23 NOTE — Patient Instructions (Signed)
Continue your medications as prescribed.  As requested, we can hold off on the cardiology referral at this time and if your blood pressure continues to elevate or we begin to have symptoms associated with your elevated blood pressure, we can refer you to cardiology.  Try to get exercise a minimum of 30 minutes per day at least 5 days per week as well as  adequate water intake all while measuring blood pressure a few times per week.  Keep a blood pressure log and bring back to clinic at your next visit.  If your readings are consistently over 130/80 to contact our office/send me a MyChart message and we will see you sooner.  Can try DASH and Mediterranean diet options, avoiding processed foods, lowering sodium intake, avoiding pork products, and eating a plant based diet for optimal health.  We have given you your influenza vaccine today.  We will plan to see you back in 6 months for hypertension follow up and labs  You will receive a survey after today's visit either digitally by e-mail or paper by [employer]. Your experiences and feedback matter to [place].  Please respond so we know how we are doing as we provide care for you.  Call us with any questions/concerns/needs.  It is my goal to be available to you for your health concerns.  Thanks for choosing me to be a partner in your healthcare needs!  Harlin Rain, FNP-C Family Nurse Practitioner [facility] Phone: [phone]

## 2020-10-04 DIAGNOSIS — Z23 Encounter for immunization: Secondary | ICD-10-CM | POA: Diagnosis not present

## 2020-11-25 ENCOUNTER — Other Ambulatory Visit: Payer: Self-pay | Admitting: Family Medicine

## 2020-11-25 DIAGNOSIS — I10 Essential (primary) hypertension: Secondary | ICD-10-CM

## 2021-02-21 ENCOUNTER — Ambulatory Visit: Payer: Medicare Other | Admitting: Family Medicine

## 2021-02-22 ENCOUNTER — Other Ambulatory Visit: Payer: Self-pay

## 2021-02-22 ENCOUNTER — Ambulatory Visit (INDEPENDENT_AMBULATORY_CARE_PROVIDER_SITE_OTHER): Payer: Medicare Other | Admitting: Unknown Physician Specialty

## 2021-02-22 ENCOUNTER — Encounter: Payer: Self-pay | Admitting: Unknown Physician Specialty

## 2021-02-22 VITALS — BP 176/67 | HR 39 | Temp 97.1°F | Resp 18 | Ht 66.0 in | Wt 253.8 lb

## 2021-02-22 DIAGNOSIS — I10 Essential (primary) hypertension: Secondary | ICD-10-CM | POA: Diagnosis not present

## 2021-02-22 DIAGNOSIS — R001 Bradycardia, unspecified: Secondary | ICD-10-CM

## 2021-02-22 MED ORDER — LOSARTAN POTASSIUM-HCTZ 100-12.5 MG PO TABS: 1.0000 | ORAL_TABLET | Freq: Every day | ORAL | 1 refills | Status: DC

## 2021-02-22 NOTE — Patient Instructions (Signed)
Stop Losartan 50 mg   Start Losartan/HCTZ 100/12.5 mg.  Add a bannana daily  Cut Metoprolol from 50 mg to 25 mg - cut pill in 1/2

## 2021-02-22 NOTE — Progress Notes (Addendum)
BP (!) 176/67 (BP Location: Left Arm, Patient Position: Sitting, Cuff Size: Large)    Pulse (!) 39    Temp (!) 97.1 F (36.2 C) (Temporal)    Resp 18    Ht 5\' 6"  (1.676 m)    Wt 253 lb 12.8 oz (115.1 kg)    SpO2 100%    BMI 40.96 kg/m    Subjective:    Patient ID: Julie James, female    DOB: Mar 05, 1951, 70 y.o.   MRN: [id number]  HPI: Julie James is a 70 y.o. female  Chief Complaint  Patient presents with   Hypertension   Hypertension BP in office today is 176/67. Does not check BPs at home. Denies CP, HA, palpitations, dizziness, or SHOB. Pt does not have specific diet she adheres to and no specific exercise plan. Does state she does not add salt to her food. She works as a Surveyor, minerals and is raising her 28 year old granddaughter which keeps her fairly active. Was referred to cardiology earlier to help manage BP but lost to follow-up  Bradycardia Nurse this morning measured heart rate at 39, recheck was 49 . Pt states she took her 50 mg of Metoprolol around 0730. Denies syncope, dizziness, or shoirtness of breath.      Relevant past medical, surgical, family and social history reviewed and updated as indicated. Interim medical history since our last visit reviewed. Allergies and medications reviewed and updated.  Review of Systems  Constitutional: Positive for fatigue. Negative for fever.  Respiratory: Negative for cough, chest tightness and shortness of breath.   Cardiovascular: Negative for chest pain and palpitations.  Gastrointestinal: Negative for diarrhea and nausea.  Skin: Negative.   Allergic/Immunologic: Positive for environmental allergies.  Neurological: Negative for syncope, light-headedness and headaches.    Per HPI unless specifically indicated above     Objective:    BP (!) 176/67 (BP Location: Left Arm, Patient Position: Sitting, Cuff Size: Large)    Pulse (!) 39    Temp (!) 97.1 F (36.2 C) (Temporal)    Resp 18    Ht 5\' 6"  (1.676 m)    Wt 253 lb  12.8 oz (115.1 kg)    SpO2 100%    BMI 40.96 kg/m   Wt Readings from Last 3 Encounters:  02/22/21 253 lb 12.8 oz (115.1 kg)  08/23/20 254 lb (115.2 kg)  05/21/20 251 lb (113.9 kg)    Physical Exam Constitutional:      General: She is not in acute distress. HENT:     Head: Normocephalic.  Cardiovascular:     Rate and Rhythm: Regular rhythm. Bradycardia present.     Heart sounds: Normal heart sounds.  Pulmonary:     Effort: Pulmonary effort is normal.     Breath sounds: Normal breath sounds.  Abdominal:     General: Bowel sounds are normal.     Palpations: Abdomen is soft.     Tenderness: There is no abdominal tenderness.  Musculoskeletal:     Right lower leg: 1+ Pitting Edema present.     Left lower leg: 1+ Pitting Edema present.     Comments: Pretibial edema  Skin:    General: Skin is warm.     Capillary Refill: Capillary refill takes less than 2 seconds.  Neurological:     General: No focal deficit present.     Mental Status: She is alert and oriented to person, place, and time.  Psychiatric:        Thought Content:  Thought content normal.        Judgment: Judgment normal.     Results for orders placed or performed in visit on 03/26/20  POCT Urinalysis Dipstick  Result Value Ref Range   Color, UA Yellow    Clarity, UA clear    Glucose, UA Negative Negative   Bilirubin, UA Negative    Ketones, UA Negative    Spec Grav, UA 1.015 1.010 - 1.025   Blood, UA Trace    pH, UA 5.0 5.0 - 8.0   Protein, UA Negative Negative   Urobilinogen, UA 0.2 0.2 or 1.0 E.U./dL   Nitrite, UA Negative    Leukocytes, UA Negative Negative   Appearance     Odor        Assessment & Plan:   Problem List Items Addressed This Visit       Cardiovascular and Mediastinum   Hypertension    BP 176/67 in office today. Discussed diet and importance of low sodium diet. Adding Losartan/HCTZ 100-12.5 mg. Check CBC, CMP, and lipid panel today. Will f/u in 1 week.       Relevant Medications    losartan-hydrochlorothiazide (HYZAAR) 100-12.5 MG tablet   Other Relevant Orders   CBC with Differential/Platelet   Comprehensive metabolic panel   Lipid panel   TSH      Other Visit Diagnoses     Bradycardia    -  Primary   Pulse low in office today. Completed EKG which shows sinus rhythm w/ PVCs and Left BBB. Decrease Metoprolol to 25 mg. Check TSH today. Will f/u in 1 week.   Relevant Orders   CBC with Differential/Platelet   Comprehensive metabolic panel   Lipid panel   TSH       Note- pt previously on Chlorthalidone causing hypokalemia. However, lower doses of thiazides not tried.  Will follow K closely.  Counseled to add Potassium to diet.  Consider Spironolactone.    Follow up plan: Return in about 1 week (around 03/01/2021).

## 2021-02-22 NOTE — Assessment & Plan Note (Signed)
BP 176/67 in office today. Discussed diet and importance of low sodium diet. Adding Losartan/HCTZ 100-12.5 mg. Check CBC, CMP, and lipid panel today. Will f/u in 1 week.

## 2021-02-23 LAB — CBC WITH DIFFERENTIAL/PLATELET
Absolute Monocytes: 424 cells/uL (ref 200–950)
Basophils Absolute: 42 cells/uL (ref 0–200)
Basophils Relative: 0.8 %
Eosinophils Absolute: 58 cells/uL (ref 15–500)
Eosinophils Relative: 1.1 %
HCT: 39.2 % (ref 35.0–45.0)
Hemoglobin: 12.5 g/dL (ref 11.7–15.5)
Lymphs Abs: 2094 cells/uL (ref 850–3900)
MCH: 28.6 pg (ref 27.0–33.0)
MCHC: 31.9 g/dL — ABNORMAL LOW (ref 32.0–36.0)
MCV: 89.7 fL (ref 80.0–100.0)
MPV: 11.2 fL (ref 7.5–12.5)
Monocytes Relative: 8 %
Neutro Abs: 2682 cells/uL (ref 1500–7800)
Neutrophils Relative %: 50.6 %
Platelets: 317 10*3/uL (ref 140–400)
RBC: 4.37 10*6/uL (ref 3.80–5.10)
RDW: 13.1 % (ref 11.0–15.0)
Total Lymphocyte: 39.5 %
WBC: 5.3 10*3/uL (ref 3.8–10.8)

## 2021-02-23 LAB — COMPREHENSIVE METABOLIC PANEL
AG Ratio: 1.4 (calc) (ref 1.0–2.5)
ALT: 13 U/L (ref 6–29)
AST: 15 U/L (ref 10–35)
Albumin: 4.6 g/dL (ref 3.6–5.1)
Alkaline phosphatase (APISO): 78 U/L (ref 37–153)
BUN: 9 mg/dL (ref 7–25)
CO2: 28 mmol/L (ref 20–32)
Calcium: 10 mg/dL (ref 8.6–10.4)
Chloride: 106 mmol/L (ref 98–110)
Creat: 0.77 mg/dL (ref 0.50–0.99)
Globulin: 3.2 g/dL (calc) (ref 1.9–3.7)
Glucose, Bld: 106 mg/dL — ABNORMAL HIGH (ref 65–99)
Potassium: 4.1 mmol/L (ref 3.5–5.3)
Sodium: 141 mmol/L (ref 135–146)
Total Bilirubin: 0.5 mg/dL (ref 0.2–1.2)
Total Protein: 7.8 g/dL (ref 6.1–8.1)

## 2021-02-23 LAB — LIPID PANEL
Cholesterol: 179 mg/dL (ref <200)
HDL: 51 mg/dL (ref >=50)
LDL Cholesterol (Calc): 106 mg/dL (calc) — ABNORMAL HIGH
Non-HDL Cholesterol (Calc): 128 mg/dL (calc) (ref <130)
Total CHOL/HDL Ratio: 3.5 (calc) (ref <5.0)
Triglycerides: 120 mg/dL (ref <150)

## 2021-02-23 LAB — TSH: TSH: 5.96 mIU/L — ABNORMAL HIGH (ref 0.40–4.50)

## 2021-02-27 ENCOUNTER — Inpatient Hospital Stay: Payer: Medicare Other

## 2021-02-27 ENCOUNTER — Emergency Department: Payer: Medicare Other

## 2021-02-27 ENCOUNTER — Inpatient Hospital Stay
Admission: EM | Admit: 2021-02-27 | Discharge: 2021-03-04 | DRG: 286 | Disposition: A | Discharge status: Other Acute Inpatient Hospital | Payer: Medicare Other | Attending: Internal Medicine | Admitting: Internal Medicine

## 2021-02-27 ENCOUNTER — Encounter: Payer: Self-pay | Admitting: Pulmonary Disease

## 2021-02-27 ENCOUNTER — Other Ambulatory Visit: Payer: Self-pay

## 2021-02-27 DIAGNOSIS — Z79899 Other long term (current) drug therapy: Secondary | ICD-10-CM | POA: Diagnosis not present

## 2021-02-27 DIAGNOSIS — R739 Hyperglycemia, unspecified: Secondary | ICD-10-CM | POA: Diagnosis not present

## 2021-02-27 DIAGNOSIS — I469 Cardiac arrest, cause unspecified: Secondary | ICD-10-CM

## 2021-02-27 DIAGNOSIS — I10 Essential (primary) hypertension: Secondary | ICD-10-CM | POA: Diagnosis not present

## 2021-02-27 DIAGNOSIS — E876 Hypokalemia: Secondary | ICD-10-CM | POA: Diagnosis present

## 2021-02-27 DIAGNOSIS — Z452 Encounter for adjustment and management of vascular access device: Secondary | ICD-10-CM

## 2021-02-27 DIAGNOSIS — G9341 Metabolic encephalopathy: Secondary | ICD-10-CM | POA: Diagnosis not present

## 2021-02-27 DIAGNOSIS — N179 Acute kidney failure, unspecified: Secondary | ICD-10-CM | POA: Diagnosis not present

## 2021-02-27 DIAGNOSIS — R000 Tachycardia, unspecified: Secondary | ICD-10-CM | POA: Diagnosis not present

## 2021-02-27 DIAGNOSIS — J9811 Atelectasis: Secondary | ICD-10-CM | POA: Diagnosis not present

## 2021-02-27 DIAGNOSIS — I444 Left anterior fascicular block: Secondary | ICD-10-CM | POA: Diagnosis present

## 2021-02-27 DIAGNOSIS — R4701 Aphasia: Secondary | ICD-10-CM | POA: Diagnosis not present

## 2021-02-27 DIAGNOSIS — Z888 Allergy status to other drugs, medicaments and biological substances status: Secondary | ICD-10-CM | POA: Diagnosis not present

## 2021-02-27 DIAGNOSIS — J9601 Acute respiratory failure with hypoxia: Secondary | ICD-10-CM

## 2021-02-27 DIAGNOSIS — Z20822 Contact with and (suspected) exposure to covid-19: Secondary | ICD-10-CM | POA: Diagnosis present

## 2021-02-27 DIAGNOSIS — R0689 Other abnormalities of breathing: Secondary | ICD-10-CM | POA: Diagnosis not present

## 2021-02-27 DIAGNOSIS — Z515 Encounter for palliative care: Secondary | ICD-10-CM | POA: Diagnosis not present

## 2021-02-27 DIAGNOSIS — I5021 Acute systolic (congestive) heart failure: Secondary | ICD-10-CM | POA: Diagnosis present

## 2021-02-27 DIAGNOSIS — I110 Hypertensive heart disease with heart failure: Secondary | ICD-10-CM | POA: Diagnosis present

## 2021-02-27 DIAGNOSIS — Z803 Family history of malignant neoplasm of breast: Secondary | ICD-10-CM

## 2021-02-27 DIAGNOSIS — J9602 Acute respiratory failure with hypercapnia: Secondary | ICD-10-CM | POA: Diagnosis present

## 2021-02-27 DIAGNOSIS — E785 Hyperlipidemia, unspecified: Secondary | ICD-10-CM | POA: Diagnosis not present

## 2021-02-27 DIAGNOSIS — R404 Transient alteration of awareness: Secondary | ICD-10-CM | POA: Diagnosis not present

## 2021-02-27 DIAGNOSIS — I428 Other cardiomyopathies: Secondary | ICD-10-CM | POA: Diagnosis not present

## 2021-02-27 DIAGNOSIS — I255 Ischemic cardiomyopathy: Secondary | ICD-10-CM | POA: Diagnosis present

## 2021-02-27 DIAGNOSIS — K219 Gastro-esophageal reflux disease without esophagitis: Secondary | ICD-10-CM | POA: Diagnosis present

## 2021-02-27 DIAGNOSIS — J9690 Respiratory failure, unspecified, unspecified whether with hypoxia or hypercapnia: Secondary | ICD-10-CM | POA: Diagnosis not present

## 2021-02-27 DIAGNOSIS — Z7189 Other specified counseling: Secondary | ICD-10-CM | POA: Diagnosis not present

## 2021-02-27 DIAGNOSIS — E7800 Pure hypercholesterolemia, unspecified: Secondary | ICD-10-CM | POA: Diagnosis present

## 2021-02-27 DIAGNOSIS — Z4682 Encounter for fitting and adjustment of non-vascular catheter: Secondary | ICD-10-CM | POA: Diagnosis not present

## 2021-02-27 DIAGNOSIS — I447 Left bundle-branch block, unspecified: Secondary | ICD-10-CM | POA: Diagnosis present

## 2021-02-27 DIAGNOSIS — K7200 Acute and subacute hepatic failure without coma: Secondary | ICD-10-CM | POA: Diagnosis present

## 2021-02-27 DIAGNOSIS — G928 Other toxic encephalopathy: Secondary | ICD-10-CM | POA: Diagnosis present

## 2021-02-27 DIAGNOSIS — D649 Anemia, unspecified: Secondary | ICD-10-CM | POA: Diagnosis present

## 2021-02-27 DIAGNOSIS — R0602 Shortness of breath: Secondary | ICD-10-CM | POA: Diagnosis not present

## 2021-02-27 DIAGNOSIS — Z6841 Body Mass Index (BMI) 40.0 and over, adult: Secondary | ICD-10-CM

## 2021-02-27 DIAGNOSIS — I517 Cardiomegaly: Secondary | ICD-10-CM | POA: Diagnosis not present

## 2021-02-27 DIAGNOSIS — G4733 Obstructive sleep apnea (adult) (pediatric): Secondary | ICD-10-CM | POA: Diagnosis present

## 2021-02-27 DIAGNOSIS — E875 Hyperkalemia: Secondary | ICD-10-CM | POA: Diagnosis not present

## 2021-02-27 HISTORY — DX: Cardiac arrest, cause unspecified: I46.9

## 2021-02-27 LAB — CBC
HCT: 39.4 % (ref 36.0–46.0)
Hemoglobin: 12.2 g/dL (ref 12.0–15.0)
MCH: 28.3 pg (ref 26.0–34.0)
MCHC: 31 g/dL (ref 30.0–36.0)
MCV: 91.4 fL (ref 80.0–100.0)
Platelets: 260 10*3/uL (ref 150–400)
RBC: 4.31 MIL/uL (ref 3.87–5.11)
RDW: 13.8 % (ref 11.5–15.5)
WBC: 11.5 10*3/uL — ABNORMAL HIGH (ref 4.0–10.5)
nRBC: 0 % (ref 0.0–0.2)

## 2021-02-27 LAB — CBC WITH DIFFERENTIAL/PLATELET
Abs Immature Granulocytes: 0.08 10*3/uL — ABNORMAL HIGH (ref 0.00–0.07)
Basophils Absolute: 0 10*3/uL (ref 0.0–0.1)
Basophils Relative: 1 %
Eosinophils Absolute: 0.1 10*3/uL (ref 0.0–0.5)
Eosinophils Relative: 1 %
HCT: 38.2 % (ref 36.0–46.0)
Hemoglobin: 12.2 g/dL (ref 12.0–15.0)
Immature Granulocytes: 1 %
Lymphocytes Relative: 54 %
Lymphs Abs: 4.4 10*3/uL — ABNORMAL HIGH (ref 0.7–4.0)
MCH: 28.6 pg (ref 26.0–34.0)
MCHC: 31.9 g/dL (ref 30.0–36.0)
MCV: 89.5 fL (ref 80.0–100.0)
Monocytes Absolute: 0.5 10*3/uL (ref 0.1–1.0)
Monocytes Relative: 7 %
Neutro Abs: 2.9 10*3/uL (ref 1.7–7.7)
Neutrophils Relative %: 36 %
Platelets: 349 10*3/uL (ref 150–400)
RBC: 4.27 MIL/uL (ref 3.87–5.11)
RDW: 14 % (ref 11.5–15.5)
WBC: 7.9 10*3/uL (ref 4.0–10.5)
nRBC: 0 % (ref 0.0–0.2)

## 2021-02-27 LAB — URINE DRUG SCREEN, QUALITATIVE (ARMC ONLY)
Amphetamines, Ur Screen: NOT DETECTED
Barbiturates, Ur Screen: NOT DETECTED
Benzodiazepine, Ur Scrn: NOT DETECTED
Cannabinoid 50 Ng, Ur ~~LOC~~: NOT DETECTED
Cocaine Metabolite,Ur ~~LOC~~: NOT DETECTED
MDMA (Ecstasy)Ur Screen: NOT DETECTED
Methadone Scn, Ur: NOT DETECTED
Opiate, Ur Screen: NOT DETECTED
Phencyclidine (PCP) Ur S: NOT DETECTED
Tricyclic, Ur Screen: NOT DETECTED

## 2021-02-27 LAB — BLOOD GAS, ARTERIAL
Acid-Base Excess: 0.8 mmol/L (ref 0.0–2.0)
Bicarbonate: 26 mmol/L (ref 20.0–28.0)
FIO2: 0.5
MECHVT: 475 mL
O2 Saturation: 99.2 %
PEEP: 5 cmH2O
Patient temperature: 37
RATE: 16 resp/min
pCO2 arterial: 43 mmHg (ref 32.0–48.0)
pH, Arterial: 7.39 (ref 7.350–7.450)
pO2, Arterial: 142 mmHg — ABNORMAL HIGH (ref 83.0–108.0)

## 2021-02-27 LAB — URINALYSIS, ROUTINE W REFLEX MICROSCOPIC
Bilirubin Urine: NEGATIVE
Glucose, UA: 50 mg/dL — AB
Ketones, ur: 5 mg/dL — AB
Leukocytes,Ua: NEGATIVE
Nitrite: NEGATIVE
Protein, ur: 100 mg/dL — AB
Specific Gravity, Urine: 1.011 (ref 1.005–1.030)
Squamous Epithelial / HPF: NONE SEEN (ref 0–5)
pH: 6 (ref 5.0–8.0)

## 2021-02-27 LAB — PROTIME-INR
INR: 1 (ref 0.8–1.2)
Prothrombin Time: 12.8 seconds (ref 11.4–15.2)

## 2021-02-27 LAB — COMPREHENSIVE METABOLIC PANEL
ALT: 196 U/L — ABNORMAL HIGH (ref 0–44)
AST: 207 U/L — ABNORMAL HIGH (ref 15–41)
Albumin: 4.3 g/dL (ref 3.5–5.0)
Alkaline Phosphatase: 77 U/L (ref 38–126)
Anion gap: 13 (ref 5–15)
BUN: 13 mg/dL (ref 8–23)
CO2: 23 mmol/L (ref 22–32)
Calcium: 9 mg/dL (ref 8.9–10.3)
Chloride: 103 mmol/L (ref 98–111)
Creatinine, Ser: 0.94 mg/dL (ref 0.44–1.00)
GFR, Estimated: >60 mL/min (ref >60)
Glucose, Bld: 196 mg/dL — ABNORMAL HIGH (ref 70–99)
Potassium: 2.7 mmol/L — CL (ref 3.5–5.1)
Sodium: 139 mmol/L (ref 135–145)
Total Bilirubin: 1 mg/dL (ref 0.3–1.2)
Total Protein: 7.8 g/dL (ref 6.5–8.1)

## 2021-02-27 LAB — MRSA PCR SCREENING: MRSA by PCR: NEGATIVE

## 2021-02-27 LAB — PHOSPHORUS
Phosphorus: 5.1 mg/dL — ABNORMAL HIGH (ref 2.5–4.6)
Phosphorus: 4.4 mg/dL (ref 2.5–4.6)

## 2021-02-27 LAB — ETHANOL: Alcohol, Ethyl (B): <10 mg/dL (ref <10)

## 2021-02-27 LAB — GLUCOSE, CAPILLARY
Glucose-Capillary: 147 mg/dL — ABNORMAL HIGH (ref 70–99)
Glucose-Capillary: 190 mg/dL — ABNORMAL HIGH (ref 70–99)

## 2021-02-27 LAB — BRAIN NATRIURETIC PEPTIDE
B Natriuretic Peptide: 176.4 pg/mL — ABNORMAL HIGH (ref 0.0–100.0)
B Natriuretic Peptide: 122.2 pg/mL — ABNORMAL HIGH (ref 0.0–100.0)

## 2021-02-27 LAB — RESP PANEL BY RT-PCR (FLU A&B, COVID) ARPGX2
Influenza A by PCR: NEGATIVE
Influenza B by PCR: NEGATIVE
SARS Coronavirus 2 by RT PCR: NEGATIVE

## 2021-02-27 LAB — TSH: TSH: 3.918 u[IU]/mL (ref 0.350–4.500)

## 2021-02-27 LAB — MAGNESIUM
Magnesium: 2.1 mg/dL (ref 1.7–2.4)
Magnesium: 2.1 mg/dL (ref 1.7–2.4)

## 2021-02-27 LAB — TROPONIN I (HIGH SENSITIVITY)
Troponin I (High Sensitivity): 13 ng/L (ref <18)
Troponin I (High Sensitivity): 105 ng/L (ref <18)

## 2021-02-27 LAB — APTT: aPTT: 30 seconds (ref 24–36)

## 2021-02-27 LAB — PROCALCITONIN: Procalcitonin: <0.1 ng/mL

## 2021-02-27 LAB — CREATININE, SERUM
Creatinine, Ser: 0.84 mg/dL (ref 0.44–1.00)
GFR, Estimated: >60 mL/min (ref >60)

## 2021-02-27 LAB — CORTISOL: Cortisol, Plasma: 15.1 ug/dL

## 2021-02-27 IMAGING — DX DG CHEST 1V
1 series · 1 of 1 positions shown · non-contrast
Comparison: [DATE]

CLINICAL DATA: Central line placement

EXAM:
CHEST  1 VIEW

[chest ap]
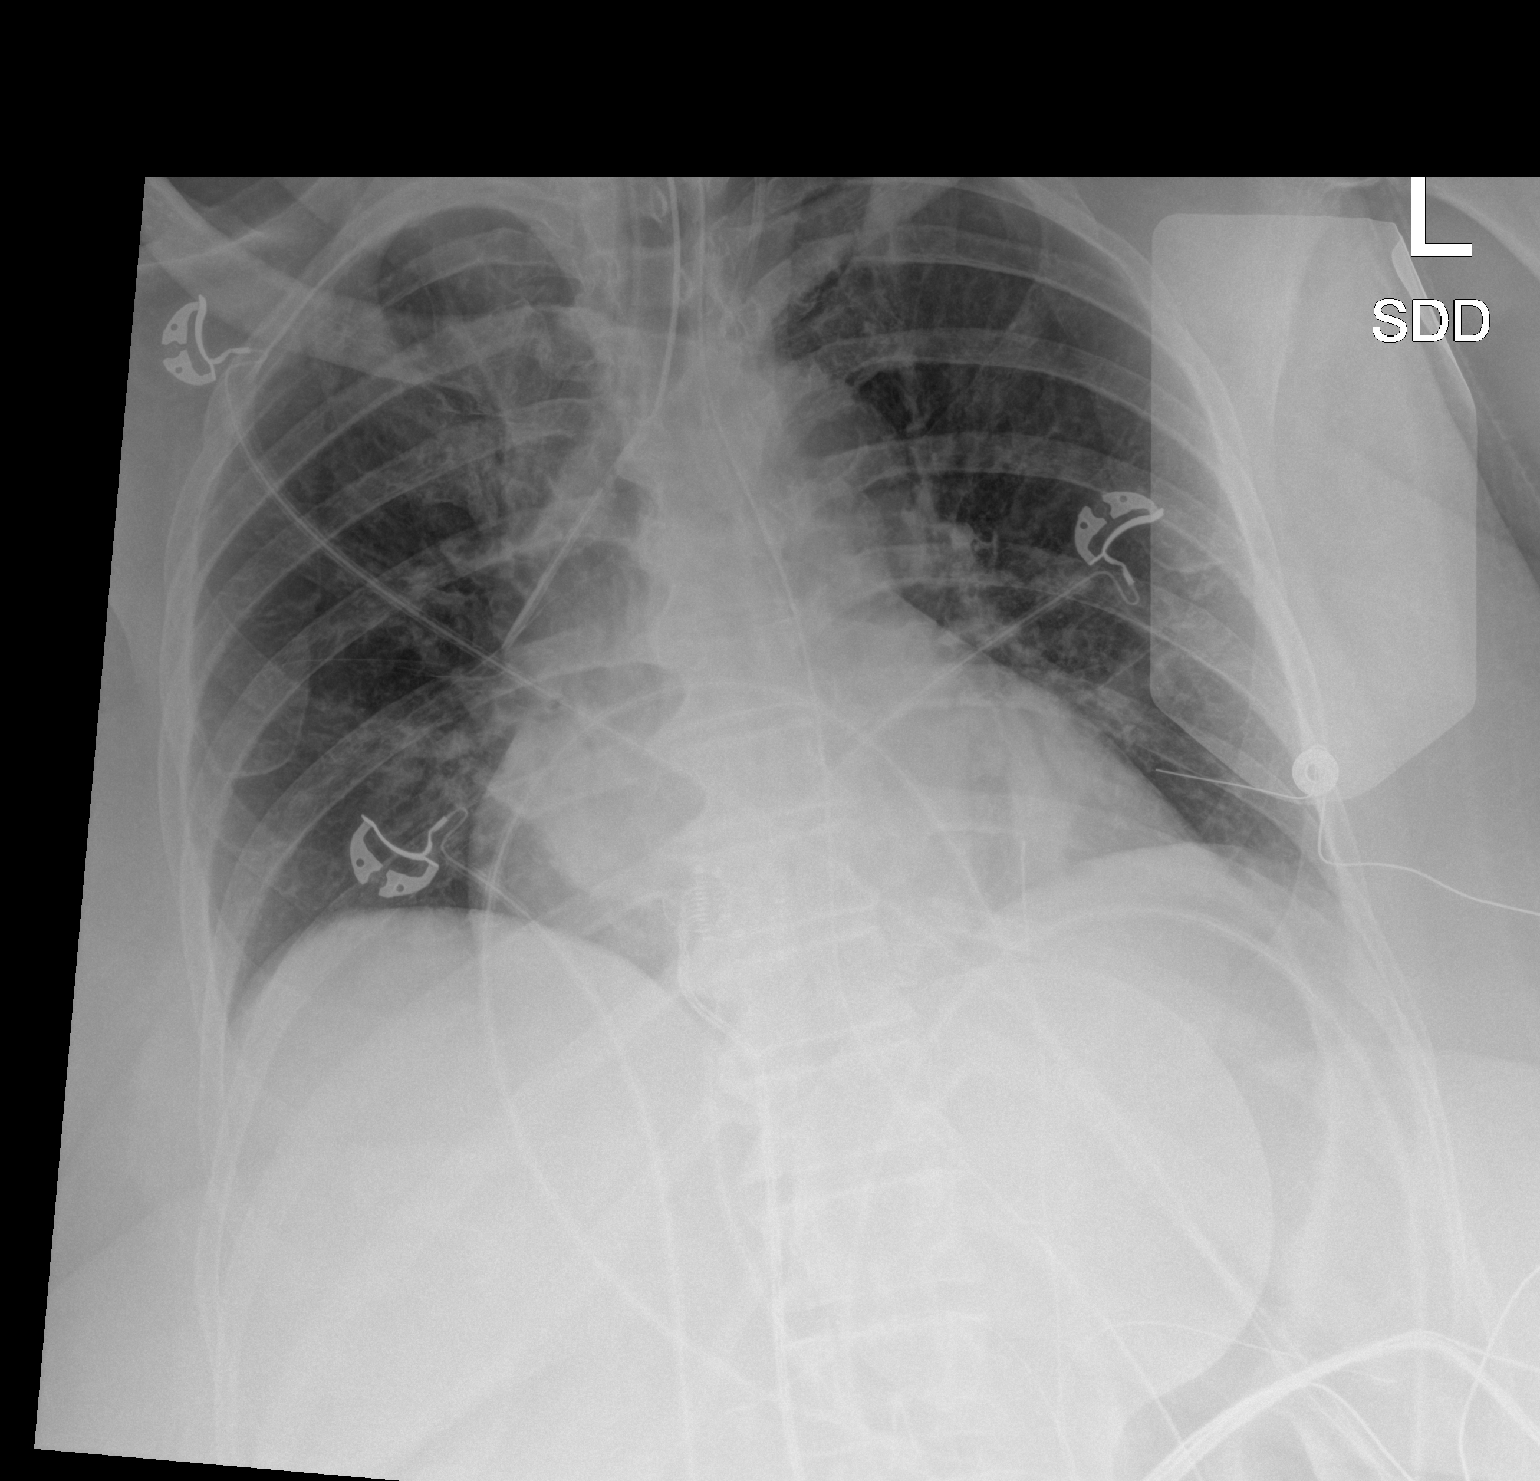

[1 of 1 positions shown; findings below may reference images not displayed]

FINDINGS: Support Apparatus:

--Endotracheal tube: Tip at the level of the clavicular heads.

--Enteric tube:Tip and sideport project over the stomach.

--Catheter(s):Left internal jugular vein approach central venous
catheter tip is at the lower SVC

--Other: None

Mild cardiomegaly. No focal airspace consolidation or pulmonary
edema. No pneumothorax. No pleural effusion.
IMPRESSION: Endotracheal tube tip at the level of the clavicular heads.

## 2021-02-27 IMAGING — DX DG ABDOMEN 1V
1 series · 1 of 1 positions shown · non-contrast
Comparison: Prior studies

CLINICAL DATA: NG tube placement.

EXAM:
ABDOMEN - 1 VIEW

[abdomen supine]
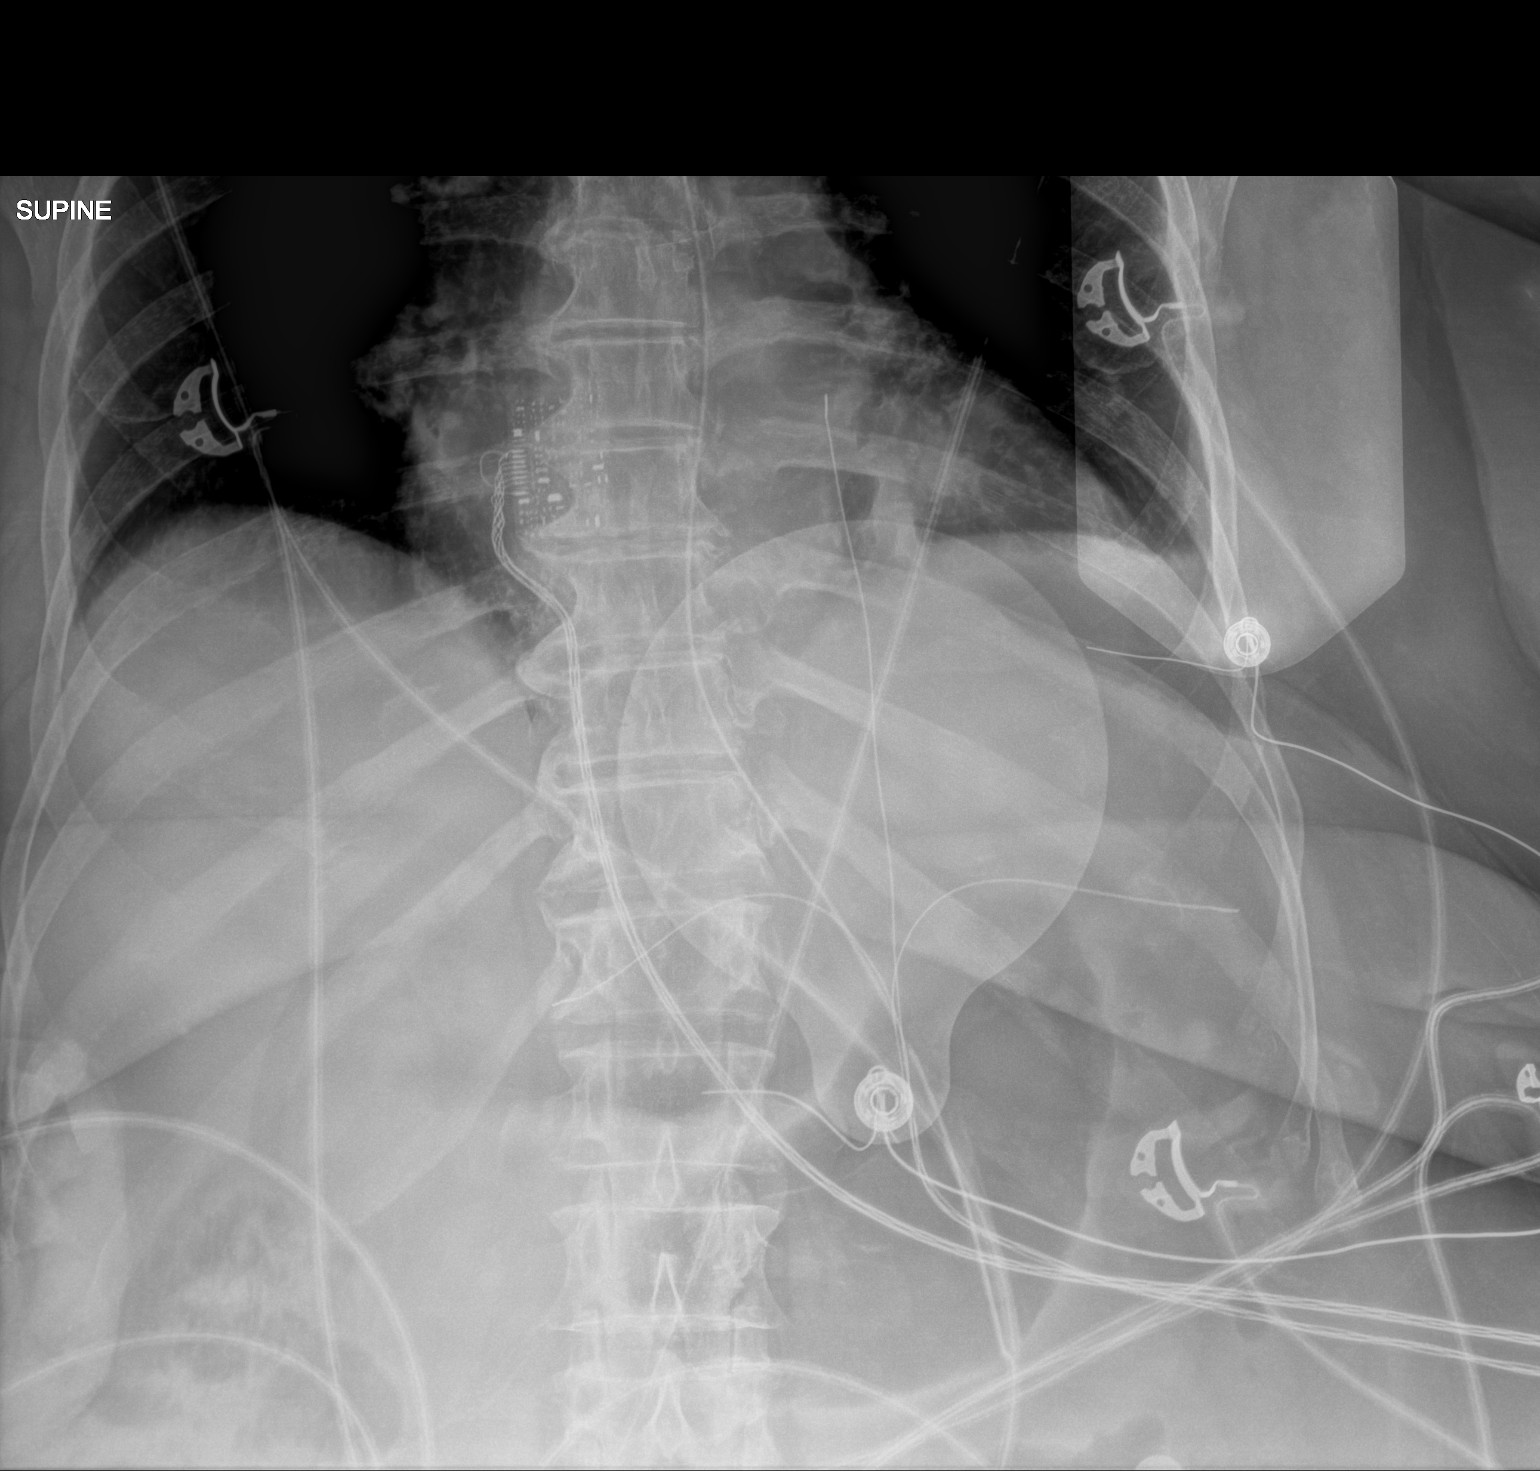

[1 of 1 positions shown; findings below may reference images not displayed]

FINDINGS: An NG tube is noted with tip overlying the mid stomach.

No other significant abnormalities noted.
IMPRESSION: NG tube with tip overlying the mid stomach.

## 2021-02-27 IMAGING — DX DG CHEST 1V PORT
1 series · 1 of 1 positions shown · non-contrast
Comparison: Portable exam [YT] hours compared to [DATE]

CLINICAL DATA: Intubation, cardiac arrest, code STEMI

EXAM:
PORTABLE CHEST 1 VIEW

[chest ap]
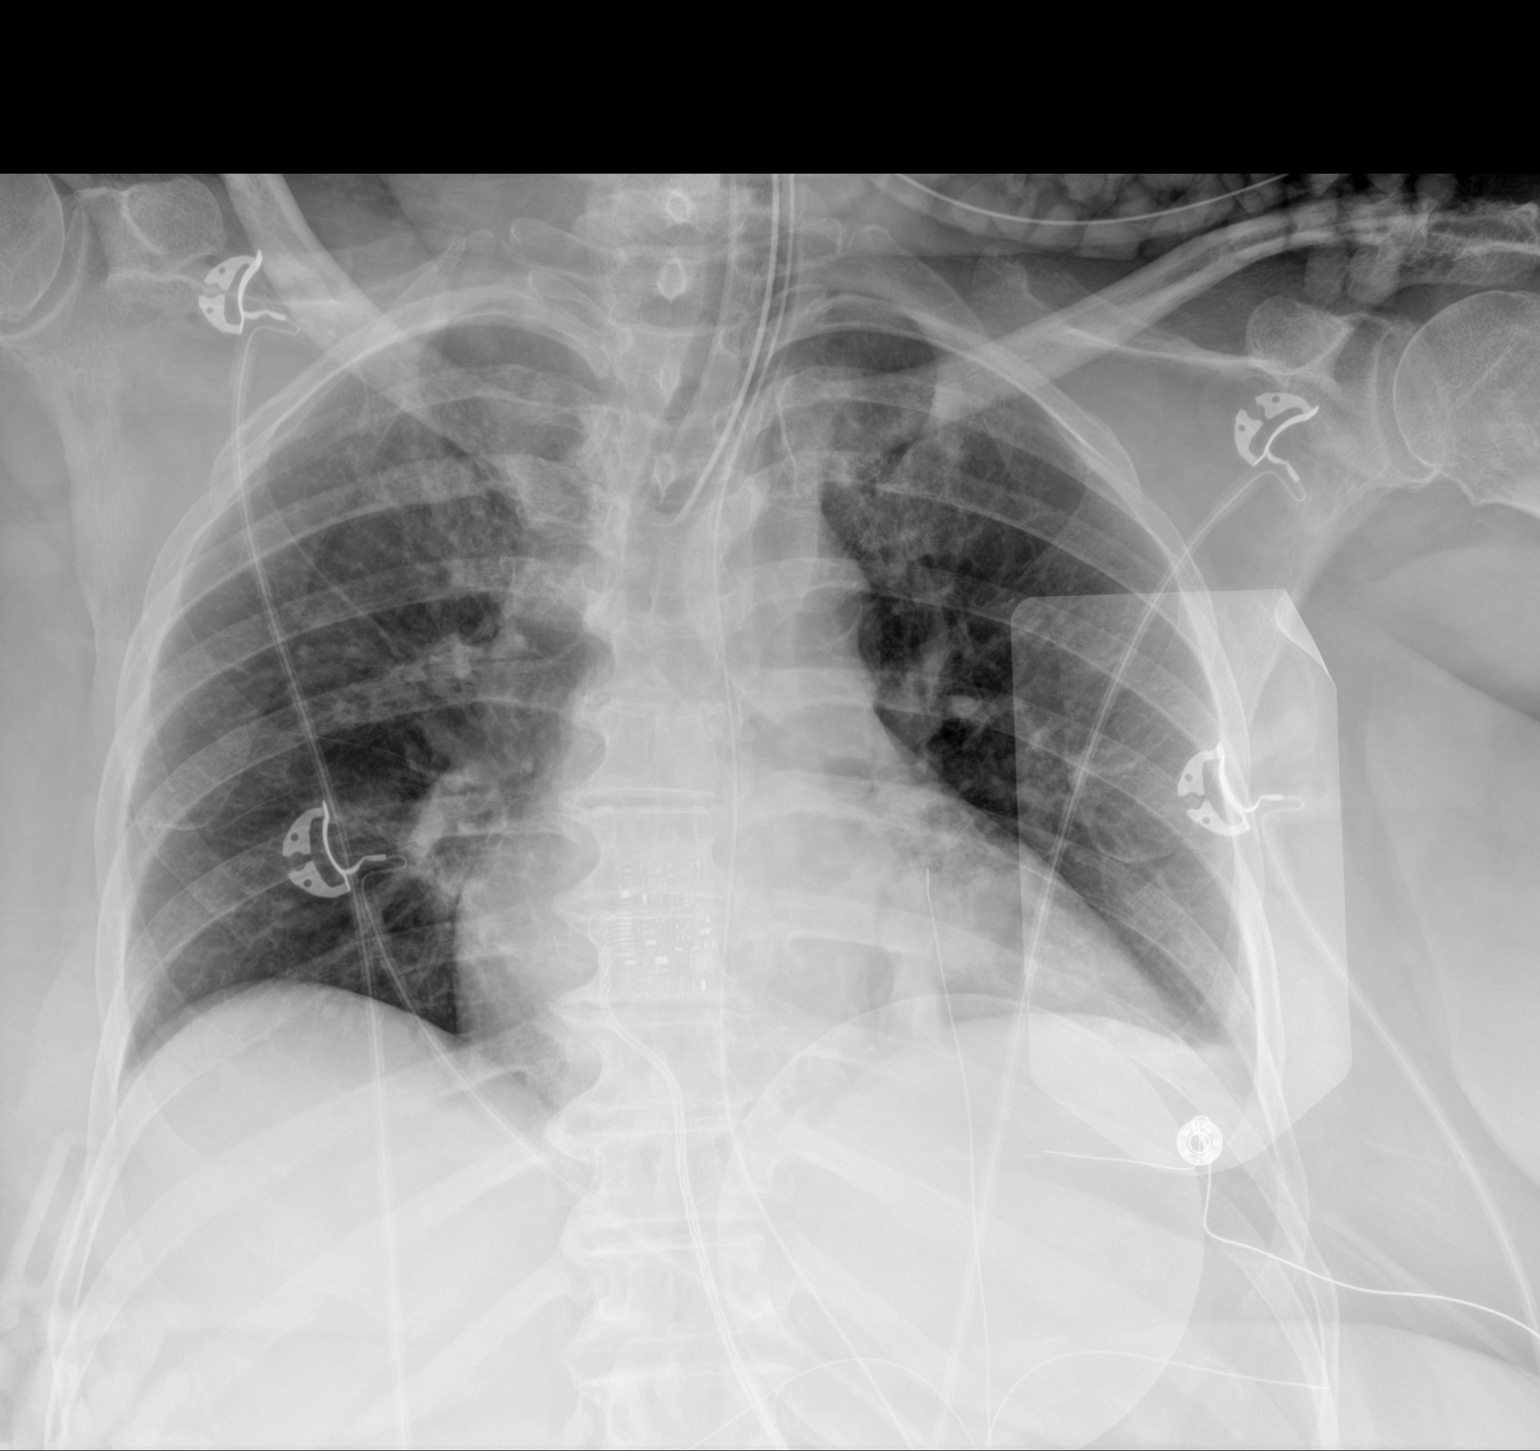

[1 of 1 positions shown; findings below may reference images not displayed]

FINDINGS: Tip of endotracheal tube projects 3.2 cm above carina.

Nasogastric tube extends into stomach.

EKG leads and external pacing leads present.

Enlargement of cardiac silhouette with slight vascular congestion.

Atherosclerotic calcification aorta.

Mediastinal contours normal.

Subsegmental atelectasis LEFT lower lobe.

Remaining lungs clear.

No pleural effusion or pneumothorax.
IMPRESSION: Subsegmental atelectasis LEFT lower lobe.

## 2021-02-27 MED ORDER — AMIODARONE HCL IN DEXTROSE 360-4.14 MG/200ML-% IV SOLN
60.0000 mg/h | INTRAVENOUS | Status: DC
  Administered 2021-02-27 (×2): 60 mg/h via INTRAVENOUS
  Filled 2021-02-27 (×2): qty 200

## 2021-02-27 MED ORDER — ROCURONIUM BROMIDE 50 MG/5ML IV SOLN
INTRAVENOUS | Status: AC | PRN
  Administered 2021-02-27: 100 mg via INTRAVENOUS

## 2021-02-27 MED ORDER — NOREPINEPHRINE 16 MG/250ML-% IV SOLN
0.0000 ug/min | INTRAVENOUS | Status: DC
  Administered 2021-02-28: 15 ug/min via INTRAVENOUS
  Filled 2021-02-27 (×2): qty 250

## 2021-02-27 MED ORDER — FENTANYL CITRATE (PF) 100 MCG/2ML IJ SOLN
50.0000 ug | Freq: Once | INTRAMUSCULAR | Status: AC
  Administered 2021-02-27: 50 ug via INTRAVENOUS

## 2021-02-27 MED ORDER — NOREPINEPHRINE 4 MG/250ML-% IV SOLN
0.0000 ug/min | INTRAVENOUS | Status: DC
  Administered 2021-02-27: 5 ug/min via INTRAVENOUS
  Filled 2021-02-27 (×2): qty 250

## 2021-02-27 MED ORDER — SODIUM CHLORIDE 0.9 % IV SOLN: 250.0000 mL | INTRAVENOUS | Status: DC | PRN

## 2021-02-27 MED ORDER — FENTANYL 2500MCG IN NS 250ML (10MCG/ML) PREMIX INFUSION: 25.0000 ug/h | INTRAVENOUS | Status: DC

## 2021-02-27 MED ORDER — PROPOFOL 1000 MG/100ML IV EMUL
5.0000 ug/kg/min | INTRAVENOUS | Status: DC
  Administered 2021-02-27 (×2): 40 ug/kg/min via INTRAVENOUS
  Administered 2021-02-27: 5 ug/kg/min via INTRAVENOUS
  Administered 2021-02-28: 20 ug/kg/min via INTRAVENOUS
  Administered 2021-02-28: 18 ug/kg/min via INTRAVENOUS
  Administered 2021-02-28: 30 ug/kg/min via INTRAVENOUS
  Administered 2021-02-28 – 2021-03-01 (×3): 20 ug/kg/min via INTRAVENOUS
  Filled 2021-02-27 (×8): qty 100

## 2021-02-27 MED ORDER — CISATRACURIUM BOLUS VIA INFUSION
0.1000 mg/kg | Freq: Once | INTRAVENOUS | Status: DC
  Filled 2021-02-27: qty 12

## 2021-02-27 MED ORDER — ONDANSETRON HCL 4 MG/2ML IJ SOLN
4.0000 mg | Freq: Four times a day (QID) | INTRAMUSCULAR | Status: DC | PRN
  Administered 2021-03-01 – 2021-03-02 (×2): 4 mg via INTRAVENOUS
  Filled 2021-02-27 (×2): qty 2

## 2021-02-27 MED ORDER — SODIUM CHLORIDE 0.9 % IV SOLN
1.0000 ug/kg/min | INTRAVENOUS | Status: DC
  Filled 2021-02-27: qty 20

## 2021-02-27 MED ORDER — PANTOPRAZOLE SODIUM 40 MG IV SOLR
40.0000 mg | Freq: Every day | INTRAVENOUS | Status: DC
  Administered 2021-02-27 – 2021-03-03 (×5): 40 mg via INTRAVENOUS
  Filled 2021-02-27 (×5): qty 40

## 2021-02-27 MED ORDER — POTASSIUM CHLORIDE 20 MEQ PO PACK
40.0000 meq | PACK | ORAL | Status: AC
  Administered 2021-02-27 (×2): 40 meq
  Filled 2021-02-27 (×2): qty 2

## 2021-02-27 MED ORDER — POTASSIUM CHLORIDE 10 MEQ/100ML IV SOLN
10.0000 meq | INTRAVENOUS | Status: AC
  Administered 2021-02-27 (×2): 10 meq via INTRAVENOUS
  Filled 2021-02-27 (×2): qty 100

## 2021-02-27 MED ORDER — SODIUM CHLORIDE 0.9% FLUSH: 3.0000 mL | INTRAVENOUS | Status: DC | PRN

## 2021-02-27 MED ORDER — POLYETHYLENE GLYCOL 3350 17 G PO PACK: 17.0000 g | PACK | Freq: Every day | ORAL | Status: DC | PRN

## 2021-02-27 MED ORDER — ENOXAPARIN SODIUM 60 MG/0.6ML ~~LOC~~ SOLN
0.5000 mg/kg | SUBCUTANEOUS | Status: DC
  Administered 2021-02-27: 57.5 mg via SUBCUTANEOUS
  Filled 2021-02-27: qty 0.6

## 2021-02-27 MED ORDER — CISATRACURIUM BOLUS VIA INFUSION
0.0500 mg/kg | INTRAVENOUS | Status: DC | PRN
  Filled 2021-02-27: qty 6

## 2021-02-27 MED ORDER — FENTANYL BOLUS VIA INFUSION
25.0000 ug | INTRAVENOUS | Status: DC | PRN
  Filled 2021-02-27: qty 25

## 2021-02-27 MED ORDER — FENTANYL 2500MCG IN NS 250ML (10MCG/ML) PREMIX INFUSION
100.0000 ug/h | INTRAVENOUS | Status: DC
  Administered 2021-02-27: 100 ug/h via INTRAVENOUS
  Administered 2021-02-28 – 2021-03-01 (×3): 200 ug/h via INTRAVENOUS
  Filled 2021-02-27 (×4): qty 250

## 2021-02-27 MED ORDER — DOCUSATE SODIUM 100 MG PO CAPS
100.0000 mg | ORAL_CAPSULE | Freq: Two times a day (BID) | ORAL | Status: DC | PRN
Start: 2021-02-27 — End: 2021-03-04

## 2021-02-27 MED ORDER — ASPIRIN 300 MG RE SUPP
300.0000 mg | Freq: Once | RECTAL | Status: AC
  Administered 2021-02-27: 300 mg via RECTAL
  Filled 2021-02-27: qty 1

## 2021-02-27 MED ORDER — SODIUM CHLORIDE 0.9 % IV SOLN: INTRAVENOUS | Status: DC

## 2021-02-27 MED ORDER — AMIODARONE LOAD VIA INFUSION
150.0000 mg | Freq: Once | INTRAVENOUS | Status: AC
  Administered 2021-02-27: 150 mg via INTRAVENOUS
  Filled 2021-02-27: qty 83.34

## 2021-02-27 MED ORDER — FREE WATER
30.0000 mL | Status: DC
  Administered 2021-02-27 – 2021-02-28 (×4): 30 mL

## 2021-02-27 MED ORDER — VASOPRESSIN 20 UNITS/100 ML INFUSION FOR SHOCK
0.0000 [IU]/min | INTRAVENOUS | Status: DC
  Administered 2021-02-27 – 2021-03-01 (×4): 0.03 [IU]/min via INTRAVENOUS
  Filled 2021-02-27 (×4): qty 100

## 2021-02-27 MED ORDER — ACETAMINOPHEN 325 MG PO TABS: 650.0000 mg | ORAL_TABLET | ORAL | Status: DC | PRN

## 2021-02-27 MED ORDER — PROPOFOL 1000 MG/100ML IV EMUL: 0.0000 ug/kg/min | INTRAVENOUS | Status: DC

## 2021-02-27 MED ORDER — PROSOURCE TF PO LIQD
45.0000 mL | Freq: Every day | ORAL | Status: DC
  Administered 2021-02-27: 45 mL
  Filled 2021-02-27 (×7): qty 45

## 2021-02-27 MED ORDER — AMIODARONE HCL IN DEXTROSE 360-4.14 MG/200ML-% IV SOLN: 30.0000 mg/h | INTRAVENOUS | Status: DC

## 2021-02-27 MED ORDER — VITAL HIGH PROTEIN PO LIQD
1000.0000 mL | ORAL | Status: DC
  Administered 2021-02-27: 1000 mL

## 2021-02-27 MED ORDER — ARTIFICIAL TEARS OPHTHALMIC OINT
1.0000 "application " | TOPICAL_OINTMENT | Freq: Three times a day (TID) | OPHTHALMIC | Status: DC
  Administered 2021-02-27 – 2021-02-28 (×3): 1 via OPHTHALMIC
  Filled 2021-02-27: qty 3.5

## 2021-02-27 MED ORDER — ETOMIDATE 2 MG/ML IV SOLN
INTRAVENOUS | Status: AC | PRN
  Administered 2021-02-27: 15 mg via INTRAVENOUS

## 2021-02-27 MED ORDER — ASPIRIN 300 MG RE SUPP: 300.0000 mg | RECTAL | Status: DC

## 2021-02-27 MED ORDER — SODIUM CHLORIDE 0.9% FLUSH
3.0000 mL | Freq: Two times a day (BID) | INTRAVENOUS | Status: DC
  Administered 2021-02-27 – 2021-03-03 (×9): 3 mL via INTRAVENOUS

## 2021-02-27 MED ORDER — ALBUTEROL SULFATE (2.5 MG/3ML) 0.083% IN NEBU: 2.5000 mg | INHALATION_SOLUTION | RESPIRATORY_TRACT | Status: DC | PRN

## 2021-02-27 MED ORDER — ASPIRIN 81 MG PO CHEW
324.0000 mg | CHEWABLE_TABLET | ORAL | Status: AC
  Administered 2021-02-27: 324 mg via ORAL
  Filled 2021-02-27: qty 4

## 2021-02-27 NOTE — Progress Notes (Signed)
PHARMACIST - PHYSICIAN COMMUNICATION  CONCERNING:  Enoxaparin (Lovenox) for DVT Prophylaxis    RECOMMENDATION: Patient was prescribed enoxaprin 40mg  q24 hours for VTE prophylaxis.   There were no vitals filed for this visit.  There is no height or weight on file to calculate BMI.  Estimated Creatinine Clearance: 74.8 mL/min (by C-G formula based on SCr of 0.94 mg/dL).   Based on [place] patient is candidate for enoxaparin 0.5mg /kg TBW SQ every 24 hours based on BMI being >30.   DESCRIPTION: Pharmacy has adjusted enoxaparin dose per [facility] policy.  Patient is now receiving enoxaparin 57.5 mg every 24 hours   Benn Moulder, PharmD Pharmacy Resident  02/27/2021 4:30 PM

## 2021-02-27 NOTE — ED Notes (Signed)
Pt transported to CT and ICU by [facility] RN and Nira Conn from [facility]

## 2021-02-27 NOTE — ED Notes (Signed)
Family at bedside. 

## 2021-02-27 NOTE — ED Provider Notes (Signed)
[facility] Emergency Department Provider Note  ____________________________________________   Event Date/Time   First MD Initiated Contact with Patient 02/27/21 1546     (approximate)  I have reviewed the triage vital signs and the nursing notes.   HISTORY  Chief Complaint Cardiac Arrest    HPI Julie James is a 70 y.o. female here with cardiac arrest.  Patient reportedly was in her usual state of health today.  She was getting into her house when she reportedly fell backwards, and went unresponsive.  Husband states he found her and she was confused and then she stopped breathing.  He immediately began CPR.  911 was called and on fire arrival, patient was reportedly in a shockable rhythm.  She received 2 shocks.  On EMS arrival, patient had a pulse and was in what appeared to be a normal sinus rhythm.  She was activated as a code STEMI due to concern for anterior precordial lead elevations, and was sent to the ED.  She has been minimally responsive in route.  She arrives with pulses, unresponsive.  Level 5 caveat invoked as remainder of history, ROS, and physical exam limited due to patient's unresponsiveness.         Past Medical History:  Diagnosis Date  . Hypercholesteremia   . Hypertension     Patient Active Problem List   Diagnosis Date Noted  . Cardiac arrest ([place]) 02/27/2021  . GERD (gastroesophageal reflux disease) 08/23/2020  . Needs flu shot 08/23/2020  . Breast cancer screening by mammogram 03/12/2020  . Screening for malignant neoplasm of colon 03/12/2020  . Drug-induced hypokalemia 08/07/2019  . Lipoma of abdomen 07/30/2017  . Lipoma of right thigh 07/30/2017  . Hypertension 06/23/2016  . Hyperlipidemia 06/23/2016  . Thyroid nodule 03/16/2014    No past surgical history on file.  Prior to Admission medications   Medication Sig Start Date End Date Taking? Authorizing Provider  amLODipine (NORVASC) 5 MG tablet Take 1  tablet (5 mg total) by mouth daily. 05/21/20  Yes Malfi, Lupita Raider, FNP  losartan-hydrochlorothiazide (HYZAAR) 100-12.5 MG tablet Take 1 tablet by mouth daily. 02/22/21  Yes Kathrine Haddock, NP  metoprolol succinate (TOPROL-XL) 50 MG 24 hr tablet Take 1 tablet (50 mg total) by mouth daily. Take with or immediately following a meal. 11/25/20  Yes Malfi, Lupita Raider, FNP  simvastatin (ZOCOR) 40 MG tablet Take 1 tablet (40 mg total) by mouth at bedtime. 05/21/20  Yes Malfi, Lupita Raider, FNP  losartan (COZAAR) 50 MG tablet Take 1.5 tablets (75 mg total) by mouth daily. 08/23/20 02/22/21  Verl Bangs, FNP    Allergies Chlorthalidone  Family History  Problem Relation Age of Onset  . Cancer Sister        breast  . Cancer Sister        breast    Social History Social History   Tobacco Use  . Smoking status: Never Smoker  . Smokeless tobacco: Never Used  Vaping Use  . Vaping Use: Never used  Substance Use Topics  . Alcohol use: No  . Drug use: No    Review of Systems  Review of Systems  Unable to perform ROS: Patient unresponsive     ____________________________________________  PHYSICAL EXAM:      VITAL SIGNS: ED Triage Vitals  Enc Vitals Group     BP      Pulse      Resp      Temp      Temp  src      SpO2      Weight      Height      Head Circumference      Peak Flow      Pain Score      Pain Loc      Pain Edu?      Excl. in [place]?      Physical Exam Constitutional:      General: She is in acute distress.     Comments: Unresponsive, BVM assisted ventilations.  Minimal agonal respirations noted.  HENT:     Head: Atraumatic.     Comments: Small amount of blood noted in the oropharynx    Nose: Nose normal.     Mouth/Throat:     Mouth: Mucous membranes are dry.  Eyes:     Comments: Pupils 5 mm, reactive bilaterally  Cardiovascular:     Rate and Rhythm: Normal rate.     Pulses: Normal pulses.  Pulmonary:     Comments: Agonal respirations with transmitted upper  airway sounds. Abdominal:     General: Abdomen is flat.  Musculoskeletal:        General: Swelling (Trace bilateral lower extremity) present.     Cervical back: Neck supple.  Skin:    General: Skin is warm.     Capillary Refill: Capillary refill takes less than 2 seconds.  Neurological:     Comments: Unresponsive, no spontaneous movement noted       ____________________________________________   LABS (all labs ordered are listed, but only abnormal results are displayed)  Labs Reviewed  CBC WITH DIFFERENTIAL/PLATELET - Abnormal; Notable for the following components:      Result Value   Lymphs Abs 4.4 (*)    Abs Immature Granulocytes 0.08 (*)    All other components within normal limits  COMPREHENSIVE METABOLIC PANEL - Abnormal; Notable for the following components:   Potassium 2.7 (*)    Glucose, Bld 196 (*)    AST 207 (*)    ALT 196 (*)    All other components within normal limits  BRAIN NATRIURETIC PEPTIDE - Abnormal; Notable for the following components:   B Natriuretic Peptide 176.4 (*)    All other components within normal limits  PHOSPHORUS - Abnormal; Notable for the following components:   Phosphorus 5.1 (*)    All other components within normal limits  BLOOD GAS, ARTERIAL - Abnormal; Notable for the following components:   pO2, Arterial 142 (*)    All other components within normal limits  RESP PANEL BY RT-PCR (FLU A&B, COVID) ARPGX2  MRSA PCR SCREENING  MAGNESIUM  URINE DRUG SCREEN, QUALITATIVE ([facility] ONLY)  ETHANOL  HIV ANTIBODY (ROUTINE TESTING W REFLEX)  CBC  CREATININE, SERUM  MAGNESIUM  PHOSPHORUS  PROCALCITONIN  BRAIN NATRIURETIC PEPTIDE  CORTISOL  URINALYSIS, ROUTINE W REFLEX MICROSCOPIC  PROTIME-INR  PROTIME-INR  APTT  APTT  TRIGLYCERIDES  CBC  BASIC METABOLIC PANEL  BLOOD GAS, ARTERIAL  MAGNESIUM  PHOSPHORUS  TROPONIN I (HIGH SENSITIVITY)  TROPONIN I (HIGH SENSITIVITY)    ____________________________________________  EKG: Normal  sinus rhythm, VR 71. QRS 178, QTc 506. LAE, IVCD. No acute St elevations or depressions. ________________________________________  RADIOLOGY All imaging, including plain films, CT scans, and ultrasounds, independently reviewed by me, and interpretations confirmed via formal radiology reads.  ED MD interpretation:   CXR: Subsegmental atelectasis left lower lobe KUB: NG tube in place  Official radiology report(s): DG Abdomen 1 View  Result Date: 02/27/2021 CLINICAL DATA:  NG tube placement. EXAM: ABDOMEN - 1 VIEW COMPARISON:  Prior studies FINDINGS: An NG tube is noted with tip overlying the mid stomach. No other significant abnormalities noted. IMPRESSION: NG tube with tip overlying the mid stomach. Electronically Signed   By: Margarette Canada M.D.   On: 02/27/2021 16:29   DG Chest Portable 1 View  Result Date: 02/27/2021 CLINICAL DATA:  Intubation, cardiac arrest, code STEMI EXAM: PORTABLE CHEST 1 VIEW COMPARISON:  Portable exam 1607 hours compared to 12/13/2019 FINDINGS: Tip of endotracheal tube projects 3.2 cm above carina. Nasogastric tube extends into stomach. EKG leads and external pacing leads present. Enlargement of cardiac silhouette with slight vascular congestion. Atherosclerotic calcification aorta. Mediastinal contours normal. Subsegmental atelectasis LEFT lower lobe. Remaining lungs clear. No pleural effusion or pneumothorax. IMPRESSION: Subsegmental atelectasis LEFT lower lobe. Electronically Signed   By: Lavonia Dana M.D.   On: 02/27/2021 16:30    ____________________________________________  PROCEDURES   Procedure(s) performed (including Critical Care):  .1-3 Lead EKG Interpretation Performed by: Duffy Bruce, MD Authorized by: Duffy Bruce, MD     Interpretation: normal     ECG rate:  90-100   ECG rate assessment: normal     Rhythm: sinus rhythm     Ectopy: none     Conduction: normal   Comments:     Indication: Cardiac arrest Procedure Name:  Intubation Date/Time: 02/27/2021 5:29 PM Performed by: Duffy Bruce, MD Pre-anesthesia Checklist: Patient identified, Patient being monitored, Emergency Drugs available, Timeout performed and Suction available Oxygen Delivery Method: Non-rebreather mask Preoxygenation: Pre-oxygenation with 100% oxygen Induction Type: Rapid sequence Ventilation: Mask ventilation without difficulty Laryngoscope Size: Glidescope and 4 Grade View: Grade I Tube size: 7.5 mm Number of attempts: 1 Airway Equipment and Method: Video-laryngoscopy and Rigid stylet Placement Confirmation: ETT inserted through vocal cords under direct vision,  CO2 detector and Breath sounds checked- equal and bilateral Dental Injury: Teeth and Oropharynx as per pre-operative assessment  Difficulty Due To: Difficulty was unanticipated Future Recommendations: Recommend- induction with short-acting agent, and alternative techniques readily available    .Critical Care Performed by: Duffy Bruce, MD Authorized by: Duffy Bruce, MD   Critical care provider statement:    Critical care time (minutes):  35   Critical care time was exclusive of:  Separately billable procedures and treating other patients and teaching time   Critical care was necessary to treat or prevent imminent or life-threatening deterioration of the following conditions:  Circulatory failure, cardiac failure and respiratory failure   Critical care was time spent personally by me on the following activities:  Development of treatment plan with patient or surrogate, discussions with consultants, evaluation of patient's response to treatment, examination of patient, obtaining history from patient or surrogate, ordering and performing treatments and interventions, ordering and review of laboratory studies, ordering and review of radiographic studies, pulse oximetry, re-evaluation of patient's condition and review of old charts   I assumed direction of critical care  for this patient from another provider in my specialty: no      ____________________________________________  INITIAL IMPRESSION / MDM / [place] / ED COURSE  As part of my medical decision making, I reviewed the following data within the [place] notes reviewed and incorporated, Old chart reviewed, Notes from prior ED visits, and [place] Controlled Substance [place] Nilan was evaluated in Emergency Department on 02/27/2021 for the symptoms described in the history of present illness. She was evaluated in the  context of the global COVID-19 pandemic, which necessitated consideration that the patient might be at risk for infection with the SARS-CoV-2 virus that causes COVID-19. Institutional protocols and algorithms that pertain to the evaluation of patients at risk for COVID-19 are in a state of rapid change based on information released by regulatory bodies including the CDC and federal and state organizations. These policies and algorithms were followed during the patient's care in the ED.  Some ED evaluations and interventions may be delayed as a result of limited staffing during the pandemic.*     Medical Decision Making:  70 yo female here with cardiac arrest.  She is in normal sinus rhythm on arrival here, unresponsive.  Patient intubated as above, tolerated well.  Chest x-ray shows appropriate ET tube placement.  Screening lab work shows hypokalemia, mild transaminitis, which could be related to her downtime/cardiac arrest. Of note, pt was just seen by her PCP and was noted to be bradycardic, had her BP meds changed. Per report from family, she had questionable hypotension this AM before she started getting ready for church, at which point she went unresponsive.  Patient was initially activated as a code STEMI.  Reviewed her EKG, which does not appear significantly changed from her prior, and I suspect the elevations that were noted by EMS  were related to her left anterior fascicular block.  Discussed with Dr. Clayborn Bigness, will hold on Cath Lab activation at this time.  Will admit to the ICU.  Given her witnessed out of hospital cardiac arrest with shockable rhythm and now ROSC with persistent unresponsiveness, will initiate cooling measures.  Will admit to the ICU.  Of note, just prior to transfer, patient went into what appeared to be a wide-complex tachycardia in the 140s.  Query V. tach versus A. fib/flutter with aberrancy versus sinus tach with aberrancy.  Synchronized cardioversion was attempted and she was given amnio bolus.  Blood pressure remained normal with good pulses.  ____________________________________________  FINAL CLINICAL IMPRESSION(S) / ED DIAGNOSES  Final diagnoses:  Cardiac arrest ([place])     MEDICATIONS GIVEN DURING THIS VISIT:  Medications  propofol (DIPRIVAN) 1000 MG/100ML infusion (40 mcg/kg/min  115.1 kg Intravenous New Bag/Given 02/27/21 1723)  fentaNYL (SUBLIMAZE) injection 50 mcg (has no administration in time range)  fentaNYL (SUBLIMAZE) bolus via infusion 25 mcg (has no administration in time range)  docusate sodium (COLACE) capsule 100 mg (has no administration in time range)  polyethylene glycol (MIRALAX / GLYCOLAX) packet 17 g (has no administration in time range)  aspirin chewable tablet 324 mg (has no administration in time range)    Or  aspirin suppository 300 mg (has no administration in time range)  enoxaparin (LOVENOX) injection 57.5 mg (has no administration in time range)  pantoprazole (PROTONIX) injection 40 mg (has no administration in time range)  sodium chloride flush (NS) 0.9 % injection 3 mL (has no administration in time range)  sodium chloride flush (NS) 0.9 % injection 3 mL (has no administration in time range)  0.9 %  sodium chloride infusion (has no administration in time range)  acetaminophen (TYLENOL) tablet 650 mg (has no administration in time range)  ondansetron  (ZOFRAN) injection 4 mg (has no administration in time range)  albuterol (PROVENTIL) (2.5 MG/3ML) 0.083% nebulizer solution 2.5 mg (has no administration in time range)  norepinephrine (LEVOPHED) 4mg  in 276mL premix infusion (has no administration in time range)  cisatracurium (NIMBEX) bolus via infusion 11.5 mg (has no administration in time range)  And  cisatracurium (NIMBEX) 200 mg in sodium chloride 0.9 % 100 mL (2 mg/mL) infusion (has no administration in time range)    And  cisatracurium (NIMBEX) bolus via infusion 5.8 mg (has no administration in time range)  artificial tears (LACRILUBE) ophthalmic ointment 1 application (has no administration in time range)  0.9 %  sodium chloride infusion (has no administration in time range)  fentaNYL 2527mcg in NS 272mL (17mcg/ml) infusion-PREMIX (has no administration in time range)  etomidate (AMIDATE) injection (15 mg Intravenous Given 02/27/21 1540)  rocuronium (ZEMURON) injection (100 mg Intravenous Given 02/27/21 1540)  aspirin suppository 300 mg (300 mg Rectal Given 02/27/21 1601)     ED Discharge Orders    None       Note:  This document was prepared using Dragon voice recognition software and may include unintentional dictation errors.   Duffy Bruce, MD 02/27/21 [phone]

## 2021-02-27 NOTE — Procedures (Signed)
Central Venous Catheter Insertion Procedure Note  Julie James  [id number]  [birth year]  Date:02/27/21  Time:8:44 PM   Provider Performing:Maalik Pinn L Rust-[place]ster   Procedure: Insertion of Non-tunneled Central Venous [phone]) with US guidance ([id number])   Indication(s) Medication administration  Consent Risks of the procedure as well as the alternatives and risks of each were explained to the patient and/or caregiver.  Consent for the procedure was obtained and is signed in the bedside chart  Anesthesia Topical only with 1% lidocaine   Timeout Verified patient identification, verified procedure, site/side was marked, verified correct patient position, special equipment/implants available, medications/allergies/relevant history reviewed, required imaging and test results available.  Sterile Technique Maximal sterile technique including full sterile barrier drape, hand hygiene, sterile gown, sterile gloves, mask, hair covering, sterile ultrasound probe cover (if used).  Procedure Description Area of catheter insertion was cleaned with chlorhexidine and draped in sterile fashion.  With real-time ultrasound guidance a central venous catheter was placed into the left internal jugular vein. Nonpulsatile blood flow and easy flushing noted in all ports.  The catheter was sutured in place and sterile dressing applied.  Complications/Tolerance None; patient tolerated the procedure well. Chest X-ray is ordered to verify placement for internal jugular or subclavian cannulation.   Chest x-ray is not ordered for femoral cannulation.  EBL Minimal  Specimen(s) None  [name], AGACNP-BC Acute Care Nurse Practitioner [facility]   [phone] / [phone] Please see Amion for pager details.

## 2021-02-27 NOTE — Procedures (Signed)
Arterial Catheter Insertion Procedure Note  Julie James  [id number]  1951/07/16  Date:02/27/21  Time:10:50 PM    Provider Performing: Huel Cote Rust-[place]ster    Procedure: Insertion of Arterial Line [phone]) with US guidance ([id number])   Indication(s) Blood pressure monitoring and/or need for frequent ABGs  Consent Risks of the procedure as well as the alternatives and risks of each were explained to the patient and/or caregiver.  Consent for the procedure was obtained and is signed in the bedside chart  Anesthesia Patient sedated with propofol & fentanyl drips while requiring mechanical ventilation.   Time Out Verified patient identification, verified procedure, site/side was marked, verified correct patient position, special equipment/implants available, medications/allergies/relevant history reviewed, required imaging and test results available.   Sterile Technique Maximal sterile technique including full sterile barrier drape, hand hygiene, sterile gown, sterile gloves, mask, hair covering, sterile ultrasound probe cover (if used).   Procedure Description Area of catheter insertion was cleaned with chlorhexidine and draped in sterile fashion. With real-time ultrasound guidance an arterial catheter was placed into the left femoral artery.  Appropriate arterial tracings confirmed on monitor.   Attempted L radial artery first, unable to access, then proceeded to access left femoral artery successfully.  Complications/Tolerance None; patient tolerated the procedure well.   EBL Minimal   Specimen(s) None   [name], AGACNP-BC Acute Care Nurse Practitioner [facility]   [phone] / [phone] Please see Amion for pager details.

## 2021-02-27 NOTE — ED Notes (Signed)
ED TO INPATIENT HANDOFF REPORT  ED Nurse Name and Phone #: Yong Channel 73  S Name/Age/Gender Julie James 70 y.o. female Room/Bed: ED02A/ED02A  Code Status   Code Status: Full Code  Home/SNF/Other Home  Is this baseline? No   Triage Complete: Triage complete  Chief Complaint Cardiac arrest [facility]) [I46.9]  Triage Note Pt presents to ED s/p witnessed cardiac arrest per EMS. AED revealed shockable rhythm per and shocks given x3 PTA> NSR upon arrival per EMS with palpable pulses. EMS reports CODE STEMI on EKG in field. Arrived being bagged via BVM.     Allergies Allergies  Allergen Reactions  . Chlorthalidone Other (See Comments)    hypokalemia    Level of Care/Admitting Diagnosis ED Disposition    ED Disposition Condition [facility] Area: [place] [100120]  Level of Care: ICU [6]  Covid Evaluation: Asymptomatic Screening Protocol (No Symptoms)  Diagnosis: Cardiac arrest ([place]) [427.5.ICD-9-CM]  Admitting Physician: Rosana Hoes  Attending Physician: Tyler Pita [phone]  Estimated length of stay: 5 - 7 days  Certification:: I certify this patient will need inpatient services for at least 2 midnights       B Medical/Surgery History Past Medical History:  Diagnosis Date  . Hypercholesteremia   . Hypertension    No past surgical history on file.   A IV Location/Drains/Wounds Patient Lines/Drains/Airways Status    Active Line/Drains/Airways    Name Placement date Placement time Site Days   Peripheral IV 02/27/21 Left Antecubital 02/27/21  1542  Antecubital  less than 1   Peripheral IV 02/27/21 Right Hand 02/27/21  1542  Hand  less than 1   NG/OG Tube Orogastric 16 Fr. 02/27/21  1604  --  less than 1   Urethral Catheter Annie RN  Temperature probe 14 Fr. 02/27/21  1606  Temperature probe  less than 1   Airway 7.5 mm 02/27/21  1542  -- less than 1          Intake/Output Last 24 hours No intake  or output data in the 24 hours ending 02/27/21 1635  Labs/Imaging Results for orders placed or performed during the hospital encounter of 02/27/21 (from the past 48 hour(s))  CBC with Differential     Status: Abnormal   Collection Time: 02/27/21  3:39 PM  Result Value Ref Range   WBC 7.9 4.0 - 10.5 K/uL   RBC 4.27 3.87 - 5.11 MIL/uL   Hemoglobin 12.2 12.0 - 15.0 g/dL   HCT 38.2 36.0 - 46.0 %   MCV 89.5 80.0 - 100.0 fL   MCH 28.6 26.0 - 34.0 pg   MCHC 31.9 30.0 - 36.0 g/dL   RDW 14.0 11.5 - 15.5 %   Platelets 349 150 - 400 K/uL   nRBC 0.0 0.0 - 0.2 %   Neutrophils Relative % 36 %   Neutro Abs 2.9 1.7 - 7.7 K/uL   Lymphocytes Relative 54 %   Lymphs Abs 4.4 (H) 0.7 - 4.0 K/uL   Monocytes Relative 7 %   Monocytes Absolute 0.5 0.1 - 1.0 K/uL   Eosinophils Relative 1 %   Eosinophils Absolute 0.1 0.0 - 0.5 K/uL   Basophils Relative 1 %   Basophils Absolute 0.0 0.0 - 0.1 K/uL   Immature Granulocytes 1 %   Abs Immature Granulocytes 0.08 (H) 0.00 - 0.07 K/uL    Comment: Performed at [facility], [address] [place], [place] [address]  Comprehensive metabolic panel  Status: Abnormal   Collection Time: 02/27/21  3:39 PM  Result Value Ref Range   Sodium 139 135 - 145 mmol/L   Potassium 2.7 (LL) 3.5 - 5.1 mmol/L    Comment: CRITICAL RESULT CALLED TO, READ BACK BY AND VERIFIED WITH Reeves Musick 02/27/21 AT 1620 BY HS    Chloride 103 98 - 111 mmol/L   CO2 23 22 - 32 mmol/L   Glucose, Bld 196 (H) 70 - 99 mg/dL    Comment: Glucose reference range applies only to samples taken after fasting for at least 8 hours.   BUN 13 8 - 23 mg/dL   Creatinine, Ser 0.94 0.44 - 1.00 mg/dL   Calcium 9.0 8.9 - 10.3 mg/dL   Total Protein 7.8 6.5 - 8.1 g/dL   Albumin 4.3 3.5 - 5.0 g/dL   AST 207 (H) 15 - 41 U/L   ALT 196 (H) 0 - 44 U/L   Alkaline Phosphatase 77 38 - 126 U/L   Total Bilirubin 1.0 0.3 - 1.2 mg/dL   GFR, Estimated >60 >60 mL/min    Comment: (NOTE) Calculated using the  CKD-EPI Creatinine Equation (2021)    Anion gap 13 5 - 15    Comment: Performed at [facility], [address] [place], [place] [address]  Brain natriuretic peptide     Status: Abnormal   Collection Time: 02/27/21  3:39 PM  Result Value Ref Range   B Natriuretic Peptide 176.4 (H) 0.0 - 100.0 pg/mL    Comment: Performed at [facility], [place], [place] [address]  Troponin I (High Sensitivity)     Status: None   Collection Time: 02/27/21  3:39 PM  Result Value Ref Range   Troponin I (High Sensitivity) 13 <18 ng/L    Comment: (NOTE) Elevated high sensitivity troponin I (hsTnI) values and significant  changes across serial measurements may suggest ACS but many other  chronic and acute conditions are known to elevate hsTnI results.  Refer to the "Links" section for chest pain algorithms and additional  guidance. Performed at [facility], [address] [place], [place] [address]   Magnesium     Status: None   Collection Time: 02/27/21  3:39 PM  Result Value Ref Range   Magnesium 2.1 1.7 - 2.4 mg/dL    Comment: Performed at [facility], [place], [address] [place], [place] [address]  Phosphorus     Status: Abnormal   Collection Time: 02/27/21  3:39 PM  Result Value Ref Range   Phosphorus 5.1 (H) 2.5 - 4.6 mg/dL    Comment: Performed at [facility], [address] [place], [place] [address]  Ethanol     Status: None   Collection Time: 02/27/21  3:39 PM  Result Value Ref Range   Alcohol, Ethyl (B) <10 <10 mg/dL    Comment: (NOTE) Lowest detectable limit for serum alcohol is 10 mg/dL.  For medical purposes only. Performed at [facility], [address] [place], [place] [address]   Blood gas, arterial     Status: Abnormal   Collection Time: 02/27/21  3:51 PM  Result Value Ref Range   FIO2 0.50    Mode ASSIST CONTROL    VT 475 mL   LHR 16 resp/min   Peep/cpap 5.0 cm H20   pH, Arterial 7.39 7.350  - 7.450   pCO2 arterial 43 32.0 - 48.0 mmHg   pO2, Arterial 142 (H) 83.0 - 108.0 mmHg  Bicarbonate 26.0 20.0 - 28.0 mmol/L   Acid-Base Excess 0.8 0.0 - 2.0 mmol/L   O2 Saturation 99.2 %   Patient temperature 37.0    Collection site RIGHT RADIAL    Sample type ARTERIAL DRAW    Allens test (pass/fail) PASS PASS    Comment: Performed at [facility], [address], [place], [place] [address]   DG Abdomen 1 View  Result Date: 02/27/2021 CLINICAL DATA:  NG tube placement. EXAM: ABDOMEN - 1 VIEW COMPARISON:  Prior studies FINDINGS: An NG tube is noted with tip overlying the mid stomach. No other significant abnormalities noted. IMPRESSION: NG tube with tip overlying the mid stomach. Electronically Signed   By: Margarette Canada M.D.   On: 02/27/2021 16:29   DG Chest Portable 1 View  Result Date: 02/27/2021 CLINICAL DATA:  Intubation, cardiac arrest, code STEMI EXAM: PORTABLE CHEST 1 VIEW COMPARISON:  Portable exam 1607 hours compared to 12/13/2019 FINDINGS: Tip of endotracheal tube projects 3.2 cm above carina. Nasogastric tube extends into stomach. EKG leads and external pacing leads present. Enlargement of cardiac silhouette with slight vascular congestion. Atherosclerotic calcification aorta. Mediastinal contours normal. Subsegmental atelectasis LEFT lower lobe. Remaining lungs clear. No pleural effusion or pneumothorax. IMPRESSION: Subsegmental atelectasis LEFT lower lobe. Electronically Signed   By: Lavonia Dana M.D.   On: 02/27/2021 16:30    Pending Labs Unresulted Labs (From admission, onward)          Start     Ordered   03/06/21 0500  Creatinine, serum  (enoxaparin (LOVENOX)    CrCl >/= 30 ml/min)  Weekly,   STAT     Comments: while on enoxaparin therapy    02/27/21 1625   02/28/21 0500  Triglycerides  (propofol (DIPRIVAN))  Every 72 hours,   STAT     Comments: While on propofol (DIPRIVAN)    02/27/21 1552   02/28/21 0500  CBC  Tomorrow morning,   STAT        02/27/21 1625    02/28/21 5631  Basic metabolic panel  Tomorrow morning,   STAT        02/27/21 1625   02/28/21 0500  Blood gas, arterial  Tomorrow morning,   STAT        02/27/21 1625   02/28/21 0500  Magnesium  Tomorrow morning,   STAT        02/27/21 1625   02/28/21 0500  Phosphorus  Tomorrow morning,   STAT        02/27/21 1625   02/27/21 1629  Protime-INR now and repeat in 8 hours  Now then every 8 hours,   STAT      02/27/21 1634   02/27/21 1629  APTT now and repeat in 8 hours  Now then every 8 hours,   STAT      02/27/21 1634   02/27/21 1620  Magnesium  Once,   STAT        02/27/21 1625   02/27/21 1620  Phosphorus  Once,   STAT        02/27/21 1625   02/27/21 1620  Procalcitonin  Once,   STAT        02/27/21 1625   02/27/21 1620  Brain natriuretic peptide  Once,   STAT        02/27/21 1625   02/27/21 1620  Cortisol  ONCE - STAT,   STAT        02/27/21 1625   02/27/21 1620  Urinalysis, Routine w  reflex microscopic Urine, Catheterized  Once,   STAT        02/27/21 1625   02/27/21 1618  CBC  (enoxaparin (LOVENOX)    CrCl >/= 30 ml/min)  Once,   STAT       Comments: Baseline for enoxaparin therapy IF NOT ALREADY DRAWN.  Notify MD if PLT < 100 K.    02/27/21 1625   02/27/21 1618  Creatinine, serum  (enoxaparin (LOVENOX)    CrCl >/= 30 ml/min)  Once,   STAT       Comments: Baseline for enoxaparin therapy IF NOT ALREADY DRAWN.    02/27/21 1625   02/27/21 1617  HIV Antibody (routine testing w rflx)  (HIV Antibody (Routine testing w reflex) panel)  Once,   STAT        02/27/21 1625   02/27/21 1605  Resp Panel by RT-PCR (Flu A&B, Covid) Nasopharyngeal Swab  (Tier 2 - Symptomatic/asymptomatic with Precautions )  Once,   STAT       Question Answer Comment  Is this test for diagnosis or screening Screening   Symptomatic for COVID-19 as defined by CDC No   Hospitalized for COVID-19 No   Admitted to ICU for COVID-19 No   Previously tested for COVID-19 Yes   Resident in a congregate (group) care  setting No   Employed in healthcare setting No   Pregnant No   Has patient completed COVID vaccination(s) (2 doses of Pfizer/Moderna 1 dose of [employer]) No      02/27/21 1604   02/27/21 1552  Urine Drug Screen, Qualitative ([facility] only)  Once,   STAT        02/27/21 1551          Vitals/Pain Today's Vitals   02/27/21 1545 02/27/21 1556  BP: (!) 150/90 (!) 150/90  Pulse: 85 83  Resp: 19 15  SpO2: 98% 100%    Isolation Precautions Airborne and Contact precautions  Medications Medications  propofol (DIPRIVAN) 1000 MG/100ML infusion (5 mcg/kg/min  115.1 kg Intravenous New Bag/Given 02/27/21 1558)  fentaNYL (SUBLIMAZE) injection 50 mcg (has no administration in time range)  fentaNYL 2540mcg in NS 222mL (87mcg/ml) infusion-PREMIX (has no administration in time range)  fentaNYL (SUBLIMAZE) bolus via infusion 25 mcg (has no administration in time range)  docusate sodium (COLACE) capsule 100 mg (has no administration in time range)  polyethylene glycol (MIRALAX / GLYCOLAX) packet 17 g (has no administration in time range)  aspirin chewable tablet 324 mg (has no administration in time range)    Or  aspirin suppository 300 mg (has no administration in time range)  enoxaparin (LOVENOX) injection 57.5 mg (has no administration in time range)  pantoprazole (PROTONIX) injection 40 mg (has no administration in time range)  sodium chloride flush (NS) 0.9 % injection 3 mL (has no administration in time range)  sodium chloride flush (NS) 0.9 % injection 3 mL (has no administration in time range)  0.9 %  sodium chloride infusion (has no administration in time range)  acetaminophen (TYLENOL) tablet 650 mg (has no administration in time range)  ondansetron (ZOFRAN) injection 4 mg (has no administration in time range)  albuterol (PROVENTIL) (2.5 MG/3ML) 0.083% nebulizer solution 2.5 mg (has no administration in time range)  norepinephrine (LEVOPHED) 4mg  in 264mL premix infusion (has no  administration in time range)  cisatracurium (NIMBEX) bolus via infusion 11.5 mg (has no administration in time range)    And  cisatracurium (NIMBEX) 200 mg in sodium chloride 0.9 %  100 mL (2 mg/mL) infusion (has no administration in time range)    And  cisatracurium (NIMBEX) bolus via infusion 5.8 mg (has no administration in time range)  artificial tears (LACRILUBE) ophthalmic ointment 1 application (has no administration in time range)  0.9 %  sodium chloride infusion (has no administration in time range)  fentaNYL 2552mcg in NS 240mL (28mcg/ml) infusion-PREMIX (has no administration in time range)  etomidate (AMIDATE) injection (15 mg Intravenous Given 02/27/21 1540)  rocuronium (ZEMURON) injection (100 mg Intravenous Given 02/27/21 1540)  aspirin suppository 300 mg (300 mg Rectal Given 02/27/21 1601)    Mobility Intubated     Focused Assessments Cardiac Assessment Handoff:    Lab Results  Component Value Date   TROPONINI < 0.02 02/20/2014   No results found for: DDIMER Does the Patient currently have chest pain? Intubated    R Recommendations: See Admitting Provider Note  Report given to:   Additional Notes: n/a

## 2021-02-27 NOTE — ED Notes (Signed)
Ice applied under arms and between legs in bags per hypothermia protocol.

## 2021-02-27 NOTE — H&P (Signed)
NAME:  Julie James, MRN:  [id number], DOB:  04-05-1951, LOS: 0 ADMISSION DATE:  02/27/2021, CONSULTATION DATE: 02/27/2021 REFERRING MD: Lindell Noe, MD, CHIEF COMPLAINT: Cardiac arrest  History of Present Illness:  Julie James is a 70 y.o. female here with cardiac arrest.  Patient reportedly was in her usual state of health today.  She was getting into her house when she reportedly fell backwards, and went unresponsive.  Husband states he found her and she was confused and then she became apneic.  He immediately began CPR.  911 was called and on fire/rescue arrival, patient was reportedly in a shockable rhythm.  She received 2 shocks.  On EMS arrival, patient had a pulse and was in what appeared to be a normal sinus rhythm.  She was activated as a code STEMI due to concern for anterior precordial lead elevations, and was sent to the ED.  She has been minimally responsive in route.  She arrived to the ED with pulses, unresponsive.  Code STEMI was activated however this was deactivated by Dr. Clayborn Bigness on STEMI call.  Patient was intubated in the emergency room due to being unresponsive.  She was to go to the CT scan for head CT however did have an episode of wide-complex tachycardia and was started on amiodarone.  CT head was aborted patient was brought to the ICU.  PCCM has been asked to admit.  Of note the patient was seen at her primary provider's office on 22 February 2021 and was noted to have significant hypertension and bradycardia at that time.  Heart rate was noted to be 39.  G at that time shows sinus rhythm with PVCs and a left bundle branch block per report.  She had losartan/HCTZ 100/12.5 added to her regimen.  Her beta-blocker was decreased to 25 mg daily.  Of note the patient had issues with chlorthalidone causing hypokalemia in the past.    Pertinent  Medical History   Past Medical History:  Diagnosis Date  . Hypercholesteremia   . Hypertension    Patient Active Problem  List   Diagnosis Date Noted  . Cardiac arrest ([place]) 02/27/2021  . GERD (gastroesophageal reflux disease) 08/23/2020  . Needs flu shot 08/23/2020  . Breast cancer screening by mammogram 03/12/2020  . Screening for malignant neoplasm of colon 03/12/2020  . Drug-induced hypokalemia 08/07/2019  . Lipoma of abdomen 07/30/2017  . Lipoma of right thigh 07/30/2017  . Hypertension 06/23/2016  . Hyperlipidemia 06/23/2016  . Thyroid nodule 03/16/2014   Significant Hospital Events: Including procedures, antibiotic start and stop dates in addition to other pertinent events   . Endotracheal intubation and mechanical ventilation 02/27/2021 . Targeted temperature management 02/27/2021  Interim History / Subjective:  Patient is currently unresponsive, intubated and mechanically ventilated.  Objective   Blood pressure (!) 150/90, pulse 83, resp. rate 15, SpO2 100 %.       No intake or output data in the 24 hours ending 02/27/21 1636 There were no vitals filed for this visit.  Examination: GENERAL: Obese woman, intubated, mechanically ventilated, unresponsive HEAD: Normocephalic, atraumatic.  EYES: Pupils equal, round, reactive to light.  No scleral icterus.  MOUTH: Orotracheally intubated, dentition intact, OG in place. NECK: Supple. No thyromegaly. Trachea midline. No JVD.  No adenopathy. PULMONARY: Good air entry bilaterally.  No adventitious sounds. CARDIOVASCULAR: S1 and S2. Regular rate and rhythm.  Difficult to auscultate due to defibrillator pads present, no murmurs appreciated. ABDOMEN: Obese, soft, normoactive bowel sounds.  No tenderness elicited but  patient is obtunded. MUSCULOSKELETAL: No joint deformity, no clubbing, no edema.  NEUROLOGIC: Unresponsive currently.  SKIN: Intact,warm,dry.  No overt rashes noted. PSYCH: Unable to assess due to intubated mechanically ventilated status.   Labs/imaging that I havepersonally reviewed  (right click and "Reselect all SmartList  Selections" daily)   Chest x-ray shows streaky atelectasis on the left lower lobe otherwise unremarkable support apparatus in good position:   BMET    Component Value Date/Time   NA 139 02/27/2021 1539   NA 138 02/20/2014 1327   K 2.7 (LL) 02/27/2021 1539   K 4.2 02/20/2014 1327   CL 103 02/27/2021 1539   CL 106 02/20/2014 1327   CO2 23 02/27/2021 1539   CO2 27 02/20/2014 1327   GLUCOSE 196 (H) 02/27/2021 1539   GLUCOSE 97 02/20/2014 1327   BUN 13 02/27/2021 1539   BUN 11 02/20/2014 1327   CREATININE 0.94 02/27/2021 1539   CREATININE 0.77 02/22/2021 1005   CALCIUM 9.0 02/27/2021 1539   CALCIUM 9.1 02/20/2014 1327   GFRNONAA >60 02/27/2021 1539   GFRNONAA 78 03/12/2020 1105   GFRAA 90 03/12/2020 1105   ABG    Component Value Date/Time   PHART 7.39 02/27/2021 1551   PCO2ART 43 02/27/2021 1551   PO2ART 142 (H) 02/27/2021 1551   HCO3 26.0 02/27/2021 1551   O2SAT 99.2 02/27/2021 1551   EKG reviewed: Sinus rhythm, interventricular conduction delay query left bundle branch block, biatrial enlargement.  Drugs of Abuse     Component Value Date/Time   LABOPIA NONE DETECTED 02/27/2021 1539   COCAINSCRNUR NONE DETECTED 02/27/2021 1539   LABBENZ NONE DETECTED 02/27/2021 1539   AMPHETMU NONE DETECTED 02/27/2021 1539   THCU NONE DETECTED 02/27/2021 1539   LABBARB NONE DETECTED 02/27/2021 1539    Urinalysis    Component Value Date/Time   COLORURINE STRAW (A) 08/07/2019 0844   APPEARANCEUR CLEAR (A) 08/07/2019 0844   APPEARANCEUR Clear 02/20/2014 1327   LABSPEC 1.009 08/07/2019 0844   LABSPEC 1.010 02/20/2014 1327   PHURINE 6.0 08/07/2019 [place] 08/07/2019 0844   GLUCOSEU Negative 02/20/2014 1327   HGBUR NEGATIVE 08/07/2019 0844   BILIRUBINUR Negative 03/26/2020 0959   BILIRUBINUR Negative 02/20/2014 [place] 08/07/2019 0844   PROTEINUR Negative 03/26/2020 0959   PROTEINUR NEGATIVE 08/07/2019 0844   UROBILINOGEN 0.2 03/26/2020 0959    NITRITE Negative 03/26/2020 0959   NITRITE NEGATIVE 08/07/2019 0844   LEUKOCYTESUR Negative 03/26/2020 0959   LEUKOCYTESUR SMALL (A) 08/07/2019 0844   LEUKOCYTESUR Negative 02/20/2014 1327   Other laboratory data will be reviewed as it becomes available.  Resolved Hospital Problem list   N/A  Assessment & Plan:  Acute respiratory failure with hypoxia due to cardiac arrest Cardiac arrest likely due to arrhythmia "Shockable rhythm" on initial monitoring Hx: HTN Wide-complex tachycardia noted query A. fib with aberrancy versus V. tach Patient started on amiodarone in the ED Continue ventilator support Patient will be placed on TTM Potassium noted to be depleted at 2.7 Replace potassium 2D echo in the a.m. Cardiology consultation (contacted Dr. Clayborn Bigness) VAP protocol Bronchodilators as needed Central line/A-line as warranted  Encephalopathy Is currently on TTM Will need CT head in the a.m. EEG once she achieves normothermia  Severe hypokalemia Replete with p.o. and IV potassium Magnesium levels adequate Likely cause of arrhythmia with increased cardiac irritability  Transaminitis Likely due to shock liver  Morbid obesity Issue adds complexity to her management   Best practice (right click and "  Reselect all SmartList Selections" daily)  Diet:  NPO Pain/Anxiety/Delirium protocol (if indicated): Yes (RASS goal -2) VAP protocol (if indicated): Yes DVT prophylaxis: LMWH GI prophylaxis: PPI Glucose control:  SSI Yes Central venous access:  N/A Arterial line:  N/A Foley:  Yes, and it is still needed Mobility:  bed rest  PT consulted: N/A Last date of multidisciplinary goals of care discussion: 27 February 2021 Code Status:  full code Disposition: ICU  Labs   CBC: Recent Labs  Lab 02/22/21 1005 02/27/21 1539  WBC 5.3 7.9  NEUTROABS 2,682 2.9  HGB 12.5 12.2  HCT 39.2 38.2  MCV 89.7 89.5  PLT 317 947    Basic Metabolic Panel: Recent Labs  Lab  02/22/21 1005 02/27/21 1539  NA 141 139  K 4.1 2.7*  CL 106 103  CO2 28 23  GLUCOSE 106* 196*  BUN 9 13  CREATININE 0.77 0.94  CALCIUM 10.0 9.0  MG  --  2.1  PHOS  --  5.1*   GFR: Estimated Creatinine Clearance: 74.8 mL/min (by C-G formula based on SCr of 0.94 mg/dL). Recent Labs  Lab 02/22/21 1005 02/27/21 1539  WBC 5.3 7.9    Liver Function Tests: Recent Labs  Lab 02/22/21 1005 02/27/21 1539  AST 15 207*  ALT 13 196*  ALKPHOS  --  77  BILITOT 0.5 1.0  PROT 7.8 7.8  ALBUMIN  --  4.3   No results for input(s): LIPASE, AMYLASE in the last 168 hours. No results for input(s): AMMONIA in the last 168 hours.  ABG    Component Value Date/Time   PHART 7.39 02/27/2021 1551   PCO2ART 43 02/27/2021 1551   PO2ART 142 (H) 02/27/2021 1551   HCO3 26.0 02/27/2021 1551   O2SAT 99.2 02/27/2021 1551     Coagulation Profile: No results for input(s): INR, PROTIME in the last 168 hours.  Cardiac Enzymes: No results for input(s): CKTOTAL, CKMB, CKMBINDEX, TROPONINI in the last 168 hours.  HbA1C: Hemoglobin A1C  Date/Time Value Ref Range Status  03/12/2020 10:25 AM 5.2 4.0 - 5.6 % Final    CBG: No results for input(s): GLUCAP in the last 168 hours.  Review of Systems:   Patient unable to provide review of systems due to being intubated mechanically ventilated.  She is post arrest.  Past Medical History:   Past Medical History:  Diagnosis Date  . Hypercholesteremia   . Hypertension    Surgical History:  No past surgical history on file.   Social History:   Social History   Tobacco Use  . Smoking status: Never Smoker  . Smokeless tobacco: Never Used  Substance Use Topics  . Alcohol use: No    Family History:  Her family history includes Cancer in her sister and sister.   Allergies Allergies  Allergen Reactions  . Chlorthalidone Other (See Comments)    hypokalemia     Home Medications  Prior to Admission medications   Medication Sig Start Date  End Date Taking? Authorizing Provider  amLODipine (NORVASC) 5 MG tablet Take 1 tablet (5 mg total) by mouth daily. 05/21/20   Malfi, Lupita Raider, FNP  losartan-hydrochlorothiazide (HYZAAR) 100-12.5 MG tablet Take 1 tablet by mouth daily. 02/22/21   Kathrine Haddock, NP  metoprolol succinate (TOPROL-XL) 50 MG 24 hr tablet Take 1 tablet (50 mg total) by mouth daily. Take with or immediately following a meal. 11/25/20   Malfi, Lupita Raider, FNP  simvastatin (ZOCOR) 40 MG tablet Take 1 tablet (40 mg total)  by mouth at bedtime. 05/21/20   Malfi, Lupita Raider, FNP  losartan (COZAAR) 50 MG tablet Take 1.5 tablets (75 mg total) by mouth daily. 08/23/20 02/22/21  Verl Bangs, FNP      The patient is critically ill with multiple organ systems failure and requires high complexity decision making for assessment and support, frequent evaluation and titration of therapies, application of advanced monitoring technologies and extensive interpretation of multiple databases. Critical Care Time devoted to patient care services described in this note is 60 minutes.  Critical care time: 60 minutes    C. Derrill Kay, MD [facility]   *This note was dictated using voice recognition software/Dragon.  Despite best efforts to proofread, errors can occur which can change the meaning.  Any change was purely unintentional.

## 2021-02-27 NOTE — ED Triage Notes (Signed)
Pt presents to ED s/p witnessed cardiac arrest per EMS. AED revealed shockable rhythm per and shocks given x3 PTA> NSR upon arrival per EMS with palpable pulses. EMS reports CODE STEMI on EKG in field. Arrived being bagged via BVM.

## 2021-02-28 ENCOUNTER — Inpatient Hospital Stay: Payer: Medicare Other

## 2021-02-28 DIAGNOSIS — J9601 Acute respiratory failure with hypoxia: Secondary | ICD-10-CM

## 2021-02-28 DIAGNOSIS — I469 Cardiac arrest, cause unspecified: Secondary | ICD-10-CM | POA: Diagnosis not present

## 2021-02-28 DIAGNOSIS — Z7189 Other specified counseling: Secondary | ICD-10-CM

## 2021-02-28 LAB — PROTIME-INR
INR: 1.1 (ref 0.8–1.2)
Prothrombin Time: 13.5 seconds (ref 11.4–15.2)

## 2021-02-28 LAB — GLUCOSE, CAPILLARY
Glucose-Capillary: 196 mg/dL — ABNORMAL HIGH (ref 70–99)
Glucose-Capillary: 172 mg/dL — ABNORMAL HIGH (ref 70–99)
Glucose-Capillary: 139 mg/dL — ABNORMAL HIGH (ref 70–99)
Glucose-Capillary: 149 mg/dL — ABNORMAL HIGH (ref 70–99)
Glucose-Capillary: 83 mg/dL (ref 70–99)
Glucose-Capillary: 119 mg/dL — ABNORMAL HIGH (ref 70–99)
Glucose-Capillary: 114 mg/dL — ABNORMAL HIGH (ref 70–99)
Glucose-Capillary: 95 mg/dL (ref 70–99)

## 2021-02-28 LAB — CBC
HCT: 33.3 % — ABNORMAL LOW (ref 36.0–46.0)
Hemoglobin: 10.8 g/dL — ABNORMAL LOW (ref 12.0–15.0)
MCH: 28.3 pg (ref 26.0–34.0)
MCHC: 32.4 g/dL (ref 30.0–36.0)
MCV: 87.4 fL (ref 80.0–100.0)
Platelets: 312 10*3/uL (ref 150–400)
RBC: 3.81 MIL/uL — ABNORMAL LOW (ref 3.87–5.11)
RDW: 13.9 % (ref 11.5–15.5)
WBC: 7.8 10*3/uL (ref 4.0–10.5)
nRBC: 0 % (ref 0.0–0.2)

## 2021-02-28 LAB — PHOSPHORUS
Phosphorus: 3.5 mg/dL (ref 2.5–4.6)
Phosphorus: 4.6 mg/dL (ref 2.5–4.6)

## 2021-02-28 LAB — APTT: aPTT: 36 seconds (ref 24–36)

## 2021-02-28 LAB — MAGNESIUM
Magnesium: 1.7 mg/dL (ref 1.7–2.4)
Magnesium: 2 mg/dL (ref 1.7–2.4)

## 2021-02-28 LAB — COMPREHENSIVE METABOLIC PANEL
ALT: 146 U/L — ABNORMAL HIGH (ref 0–44)
AST: 117 U/L — ABNORMAL HIGH (ref 15–41)
Albumin: 3.6 g/dL (ref 3.5–5.0)
Alkaline Phosphatase: 65 U/L (ref 38–126)
Anion gap: 10 (ref 5–15)
BUN: 19 mg/dL (ref 8–23)
CO2: 21 mmol/L — ABNORMAL LOW (ref 22–32)
Calcium: 8.4 mg/dL — ABNORMAL LOW (ref 8.9–10.3)
Chloride: 103 mmol/L (ref 98–111)
Creatinine, Ser: 1.44 mg/dL — ABNORMAL HIGH (ref 0.44–1.00)
GFR, Estimated: 40 mL/min — ABNORMAL LOW (ref >60)
Glucose, Bld: 155 mg/dL — ABNORMAL HIGH (ref 70–99)
Potassium: 3.7 mmol/L (ref 3.5–5.1)
Sodium: 134 mmol/L — ABNORMAL LOW (ref 135–145)
Total Bilirubin: 0.4 mg/dL (ref 0.3–1.2)
Total Protein: 6.7 g/dL (ref 6.5–8.1)

## 2021-02-28 LAB — TROPONIN I (HIGH SENSITIVITY)
Troponin I (High Sensitivity): 502 ng/L (ref <18)
Troponin I (High Sensitivity): 408 ng/L (ref <18)

## 2021-02-28 LAB — BASIC METABOLIC PANEL
Anion gap: 10 (ref 5–15)
BUN: 17 mg/dL (ref 8–23)
CO2: 25 mmol/L (ref 22–32)
Calcium: 8.5 mg/dL — ABNORMAL LOW (ref 8.9–10.3)
Chloride: 101 mmol/L (ref 98–111)
Creatinine, Ser: 1.07 mg/dL — ABNORMAL HIGH (ref 0.44–1.00)
GFR, Estimated: 57 mL/min — ABNORMAL LOW (ref >60)
Glucose, Bld: 198 mg/dL — ABNORMAL HIGH (ref 70–99)
Potassium: 3.7 mmol/L (ref 3.5–5.1)
Sodium: 136 mmol/L (ref 135–145)

## 2021-02-28 LAB — TRIGLYCERIDES: Triglycerides: 88 mg/dL (ref <150)

## 2021-02-28 LAB — HEPARIN LEVEL (UNFRACTIONATED)
Heparin Unfractionated: 0.89 IU/mL — ABNORMAL HIGH (ref 0.30–0.70)
Heparin Unfractionated: 1.04 IU/mL — ABNORMAL HIGH (ref 0.30–0.70)

## 2021-02-28 LAB — HIV ANTIBODY (ROUTINE TESTING W REFLEX): HIV Screen 4th Generation wRfx: NONREACTIVE

## 2021-02-28 IMAGING — DX DG CHEST 1V PORT
1 series · 1 of 1 positions shown · non-contrast
Comparison: Chest radiograph [DATE]

CLINICAL DATA: Respiratory failure

EXAM:
PORTABLE CHEST 1 VIEW

[chest ap]
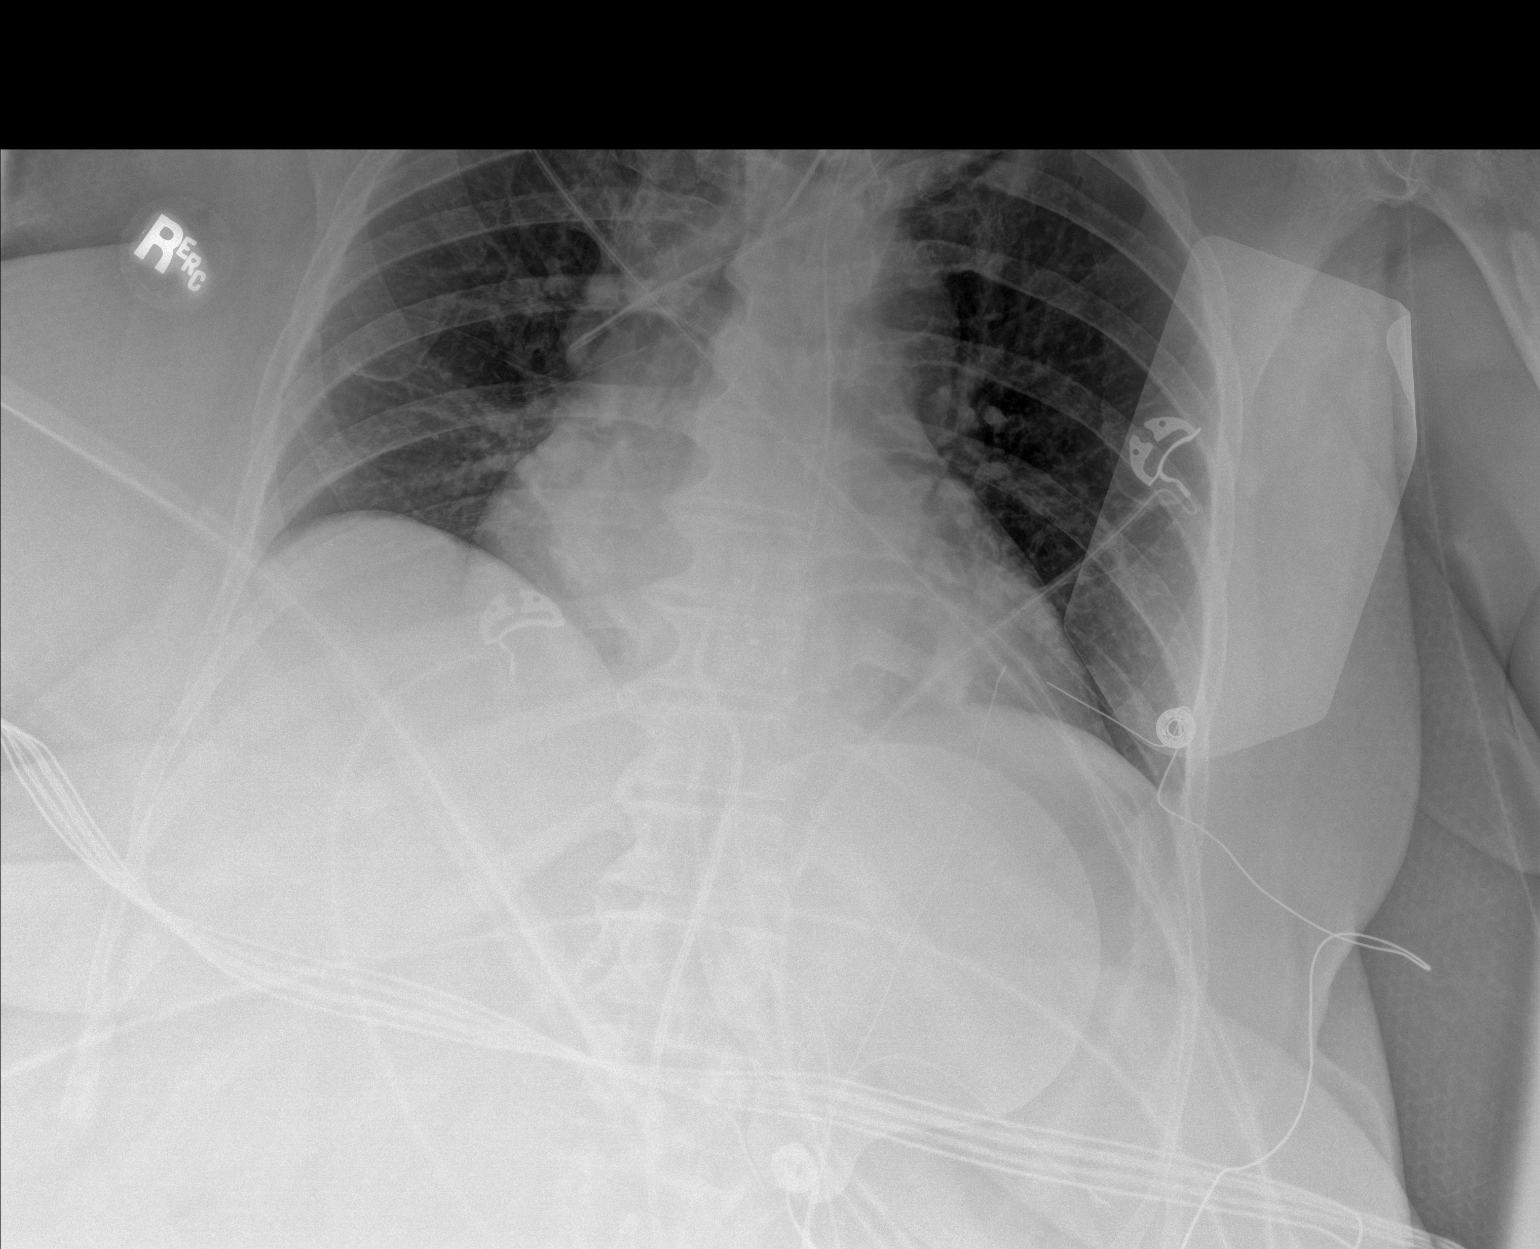

[1 of 1 positions shown; findings below may reference images not displayed]

FINDINGS: Endotracheal tube with tip overlying the midthoracic trachea. Left
IJ central venous catheter with tip overlying the SVC. Nasogastric
tube coursing below the diaphragm with tip obscured by collimation.
Defibrillator leads overlie the left chest and left upper quadrant.
The heart size and mediastinal contours are unchanged. No focal
consolidation or overt pulmonary edema. No pleural effusion. No
pneumothorax. The visualized skeletal structures are unchanged.
IMPRESSION: 1. No acute cardiopulmonary findings.
2. Stable support apparatus.

## 2021-02-28 MED ORDER — CHLORHEXIDINE GLUCONATE CLOTH 2 % EX PADS
6.0000 | MEDICATED_PAD | Freq: Every day | CUTANEOUS | Status: DC
  Administered 2021-02-28 – 2021-03-02 (×3): 6 via TOPICAL

## 2021-02-28 MED ORDER — LACTATED RINGERS IV BOLUS
1000.0000 mL | Freq: Once | INTRAVENOUS | Status: AC
  Administered 2021-02-28: 1000 mL via INTRAVENOUS

## 2021-02-28 MED ORDER — SODIUM CHLORIDE 0.9 % IV SOLN: INTRAVENOUS | Status: DC

## 2021-02-28 MED ORDER — POTASSIUM CHLORIDE 20 MEQ PO PACK
40.0000 meq | PACK | Freq: Once | ORAL | Status: AC
  Administered 2021-02-28: 40 meq
  Filled 2021-02-28: qty 2

## 2021-02-28 MED ORDER — ORAL CARE MOUTH RINSE
15.0000 mL | OROMUCOSAL | Status: DC
  Administered 2021-02-28 – 2021-03-01 (×8): 15 mL via OROMUCOSAL

## 2021-02-28 MED ORDER — CHLORHEXIDINE GLUCONATE 0.12% ORAL RINSE (MEDLINE KIT)
15.0000 mL | Freq: Two times a day (BID) | OROMUCOSAL | Status: DC
  Administered 2021-02-28 – 2021-03-01 (×2): 15 mL via OROMUCOSAL

## 2021-02-28 MED ORDER — PHENYLEPHRINE CONCENTRATED 100MG/250ML (0.4 MG/ML) INFUSION SIMPLE
0.0000 ug/min | INTRAVENOUS | Status: DC
  Filled 2021-02-28: qty 250

## 2021-02-28 MED ORDER — AMIODARONE HCL IN DEXTROSE 360-4.14 MG/200ML-% IV SOLN
INTRAVENOUS | Status: AC
  Filled 2021-02-28: qty 200

## 2021-02-28 MED ORDER — HEPARIN (PORCINE) 25000 UT/250ML-% IV SOLN
1000.0000 [IU]/h | INTRAVENOUS | Status: DC
  Administered 2021-02-28: 1200 [IU]/h via INTRAVENOUS
  Filled 2021-02-28: qty 250

## 2021-02-28 MED ORDER — MAGNESIUM SULFATE 2 GM/50ML IV SOLN
2.0000 g | Freq: Once | INTRAVENOUS | Status: AC
  Administered 2021-02-28: 2 g via INTRAVENOUS
  Filled 2021-02-28: qty 50

## 2021-02-28 MED ORDER — HEPARIN (PORCINE) 25000 UT/250ML-% IV SOLN
750.0000 [IU]/h | INTRAVENOUS | Status: DC
  Administered 2021-02-28 – 2021-03-01 (×2): 750 [IU]/h via INTRAVENOUS
  Filled 2021-02-28: qty 250

## 2021-02-28 NOTE — Progress Notes (Signed)
   02/28/21 0940  Clinical Encounter Type  Visited With Patient not available  Visit Type Initial;Spiritual support;Social support  Referral From Nurse  Consult/Referral To Chaplain   Chaplain attempted to visit with the Pt's husband for emotional support, but he was not available.

## 2021-02-28 NOTE — Progress Notes (Signed)
Patient opens eyes to voice command and was able to weakly squeeze with right hand on command.

## 2021-02-28 NOTE — Progress Notes (Signed)
Initiall met family of patient from [facility], large family presence. Medical staff attending to patient, doctor consultation, patient moved to ICU, will follow up.

## 2021-02-28 NOTE — Progress Notes (Signed)
[facility] for Heparin infusion Indication: ACS/STEMI  Allergies  Allergen Reactions  . Chlorthalidone Other (See Comments)    hypokalemia    Patient Measurements: Height: 5\' 6"  (167.6 cm) Weight: 118.1 kg (260 lb 5.8 oz) IBW/kg (Calculated) : 59.3 Heparin Dosing Weight: 87.3 kg  Vital Signs: Temp: 96.6 F (35.9 C) (03/28 1000) Temp Source: Core (03/28 1000) BP: 139/63 (03/28 1000) Pulse Rate: 51 (03/28 1000)  Labs: Recent Labs    02/27/21 1539 02/27/21 1758 02/27/21 2346 02/28/21 0454 02/28/21 0720 02/28/21 1018  HGB 12.2 12.2  --  10.8*  --   --   HCT 38.2 39.4  --  33.3*  --   --   PLT 349 260  --  312  --   --   APTT  --  30 36  --   --   --   LABPROT  --  12.8 13.5  --   --   --   INR  --  1.0 1.1  --   --   --   HEPARINUNFRC  --   --   --   --   --  0.89*  CREATININE 0.94 0.84 1.07* 1.44*  --   --   TROPONINIHS 13 105*  --  502* 408*  --     Estimated Creatinine Clearance: 49.6 mL/min (A) (by C-G formula based on SCr of 1.44 mg/dL (H)).   Medical History: Past Medical History:  Diagnosis Date  . Hypercholesteremia   . Hypertension     Medications:  Pt ordered Lovenox prophylaxis (57.5mg ) with last dose given 3/27 @ 2045. Heparin started 3/28 @ 0500  Assessment: Pt is 70 yo female arrived via EMS, admitted for cardiac arrest.  Pt has a limited med hx PTA.   Baseline aPTT: 30 sec.  Baseline INR: 1.1  Baseline CBC: Hgb 10.8, Plt 312   Date   Time    HL: 03/28  1018   0.89; supratherapeutic  Goal of Therapy:  Heparin level 0.3-0.7 units/ml Monitor platelets by anticoagulation protocol: Yes   Plan:  - Heparin level is supratherapeutic. May be some residual effect from lovenox on level. Will decrease heparin infusion from 1200 units/hr > 1000 units/hr.  - Check HL in 6 hours.  - CBC daily while on heparin.  Doreatha Massed, Student-PharmD 02/28/2021 10:53 AM

## 2021-02-28 NOTE — Consult Note (Signed)
CARDIOLOGY CONSULT NOTE               Patient ID: Shayley Medlin MRN: [id number] DOB/AGE: [birth year] 70 y.o.  Admit date: 02/27/2021 Referring Physician Dr. Patsey Berthold critical care Primary Physician Kathrine Haddock, NP Primary Cardiologist none Reason for Consultation cardiac arrest sudden death  HPI: Patient is a [age]-year-old female history of hypertension bradycardia obesity hyperlipidemia bronchitis reportedly had an episode of sudden death in the field at defibrillation code STEMI was initiated but EKG did not meet STEMI criteria patient .  Patient now intubated sedated history is per EMS and from the chart  Review of systems complete and found to be negative unless listed above     Past Medical History:  Diagnosis Date  . Hypercholesteremia   . Hypertension     No past surgical history on file.  Medications Prior to Admission  Medication Sig Dispense Refill Last Dose  . amLODipine (NORVASC) 5 MG tablet Take 1 tablet (5 mg total) by mouth daily. 90 tablet 3 02/27/2021 at Unknown time  . losartan-hydrochlorothiazide (HYZAAR) 100-12.5 MG tablet Take 1 tablet by mouth daily. 90 tablet 1 02/27/2021 at Unknown time  . metoprolol succinate (TOPROL-XL) 50 MG 24 hr tablet Take 1 tablet (50 mg total) by mouth daily. Take with or immediately following a meal. 90 tablet 1 02/27/2021 at Unknown time  . simvastatin (ZOCOR) 40 MG tablet Take 1 tablet (40 mg total) by mouth at bedtime. 90 tablet 1 02/26/2021 at Unknown time   Social History   Socioeconomic History  . Marital status: Married    Spouse name: Not on file  . Number of children: Not on file  . Years of education: Not on file  . Highest education level: Not on file  Occupational History  . Not on file  Tobacco Use  . Smoking status: Never Smoker  . Smokeless tobacco: Never Used  Vaping Use  . Vaping Use: Never used  Substance and Sexual Activity  . Alcohol use: No  . Drug use: No  . Sexual activity: Not on file   Other Topics Concern  . Not on file  Social History Narrative  . Not on file   Social Determinants of Health   Financial Resource Strain: Not on file  Food Insecurity: Not on file  Transportation Needs: Not on file  Physical Activity: Not on file  Stress: Not on file  Social Connections: Not on file  Intimate Partner Violence: Not on file    Family History  Problem Relation Age of Onset  . Cancer Sister        breast  . Cancer Sister        breast      Review of systems complete and found to be negative unless listed above      PHYSICAL EXAM  General: Well developed, well nourished, in no acute distress HEENT:  Normocephalic and atramatic Neck:  No JVD.  Lungs: Clear bilaterally to auscultation and percussion. Heart: HRRR . Normal S1 and S2 without gallops or murmurs.  Abdomen: Bowel sounds are positive, abdomen soft and non-tender  Msk:  Back normal, normal gait. Normal strength and tone for age. Extremities: No clubbing, cyanosis or edema.   Neuro: Intubated sedated Psych:  Good affect, responds appropriately intubated  Labs:   Lab Results  Component Value Date   WBC 11.5 (H) 02/27/2021   HGB 12.2 02/27/2021   HCT 39.4 02/27/2021   MCV 91.4 02/27/2021   PLT 260 02/27/2021  Recent Labs  Lab 02/27/21 1539 02/27/21 1758 02/27/21 2346  NA 139  --  136  K 2.7*  --  3.7  CL 103  --  101  CO2 23  --  25  BUN 13  --  17  CREATININE 0.94   < > 1.07*  CALCIUM 9.0  --  8.5*  PROT 7.8  --   --   BILITOT 1.0  --   --   ALKPHOS 77  --   --   ALT 196*  --   --   AST 207*  --   --   GLUCOSE 196*  --  198*   < > = values in this interval not displayed.   Lab Results  Component Value Date   TROPONINI < 0.02 02/20/2014    Lab Results  Component Value Date   CHOL 179 02/22/2021   CHOL 166 03/12/2020   CHOL 169 02/17/2019   Lab Results  Component Value Date   HDL 51 02/22/2021   HDL 47 (L) 03/12/2020   HDL 48 (L) 02/17/2019   Lab Results   Component Value Date   LDLCALC 106 (H) 02/22/2021   LDLCALC 100 (H) 03/12/2020   LDLCALC 102 (H) 02/17/2019   Lab Results  Component Value Date   TRIG 120 02/22/2021   TRIG 99 03/12/2020   TRIG 94 02/17/2019   Lab Results  Component Value Date   CHOLHDL 3.5 02/22/2021   CHOLHDL 3.5 03/12/2020   CHOLHDL 3.5 02/17/2019   No results found for: LDLDIRECT    Radiology: DG Chest 1 View  Result Date: 02/27/2021 CLINICAL DATA:  Central line placement EXAM: CHEST  1 VIEW COMPARISON:  02/27/2021 FINDINGS: Support Apparatus: --Endotracheal tube: Tip at the level of the clavicular heads. --Enteric tube:Tip and sideport project over the stomach. --Catheter(s):Left internal jugular vein approach central venous catheter tip is at the lower SVC --Other: None Mild cardiomegaly. No focal airspace consolidation or pulmonary edema. No pneumothorax. No pleural effusion. IMPRESSION: Endotracheal tube tip at the level of the clavicular heads. Electronically Signed   By: Ulyses Jarred M.D.   On: 02/27/2021 20:33   DG Abdomen 1 View  Result Date: 02/27/2021 CLINICAL DATA:  NG tube placement. EXAM: ABDOMEN - 1 VIEW COMPARISON:  Prior studies FINDINGS: An NG tube is noted with tip overlying the mid stomach. No other significant abnormalities noted. IMPRESSION: NG tube with tip overlying the mid stomach. Electronically Signed   By: Margarette Canada M.D.   On: 02/27/2021 16:29   DG Chest Portable 1 View  Result Date: 02/27/2021 CLINICAL DATA:  Intubation, cardiac arrest, code STEMI EXAM: PORTABLE CHEST 1 VIEW COMPARISON:  Portable exam 1607 hours compared to 12/13/2019 FINDINGS: Tip of endotracheal tube projects 3.2 cm above carina. Nasogastric tube extends into stomach. EKG leads and external pacing leads present. Enlargement of cardiac silhouette with slight vascular congestion. Atherosclerotic calcification aorta. Mediastinal contours normal. Subsegmental atelectasis LEFT lower lobe. Remaining lungs clear. No  pleural effusion or pneumothorax. IMPRESSION: Subsegmental atelectasis LEFT lower lobe. Electronically Signed   By: Lavonia Dana M.D.   On: 02/27/2021 16:30    EKG: Normal sinus rhythm interventricular conduction delay incomplete left bundle nonspecific ST-T changes rate of 80  ASSESSMENT AND PLAN:  Acute respiratory failure Sudden death Cardiac arrest status post defibrillation Undergo code ice cooling protocol Abnormal EKG Intubated sedated Hypokalemia Bundle branch block Obesity Possible obstructive sleep apnea  . Plan Agree with admit to telemetry Anticoagulation with heparin Correct  electrolytes Follow-up EKGs and troponins Given echocardiogram for assessment of left ventricular function Critical care pulmonary support on the vent wean as soon as possible Continue amiodarone post defibrillation Once the patient extubated consider sleep study CPAP if indicated Do not recommend cardiac cath at this point   Signed: Yolonda Kida MD 02/28/2021, 12:37 AM

## 2021-02-28 NOTE — Progress Notes (Signed)
[facility] for Heparin infusion Indication: ACS/STEMI  Allergies  Allergen Reactions  . Chlorthalidone Other (See Comments)    hypokalemia    Patient Measurements: Height: 5\' 6"  (167.6 cm) Weight: 118.1 kg (260 lb 5.8 oz) IBW/kg (Calculated) : 59.3 Heparin Dosing Weight: 87.3 kg  Vital Signs: Temp: 97.2 F (36.2 C) (03/28 1800) Temp Source: Core (03/28 1800) BP: 127/61 (03/28 1800) Pulse Rate: 47 (03/28 1800)  Labs: Recent Labs    02/27/21 1539 02/27/21 1758 02/27/21 2346 02/28/21 0454 02/28/21 0720 02/28/21 1018 02/28/21 1728  HGB 12.2 12.2  --  10.8*  --   --   --   HCT 38.2 39.4  --  33.3*  --   --   --   PLT 349 260  --  312  --   --   --   APTT  --  30 36  --   --   --   --   LABPROT  --  12.8 13.5  --   --   --   --   INR  --  1.0 1.1  --   --   --   --   HEPARINUNFRC  --   --   --   --   --  0.89* 1.04*  CREATININE 0.94 0.84 1.07* 1.44*  --   --   --   TROPONINIHS 13 105*  --  502* 408*  --   --     Estimated Creatinine Clearance: 49.6 mL/min (A) (by C-G formula based on SCr of 1.44 mg/dL (H)).   Medical History: Past Medical History:  Diagnosis Date  . Hypercholesteremia   . Hypertension     Medications:  Pt ordered Lovenox prophylaxis (57.5mg ) with last dose given 3/27 @ 2045. Heparin started 3/28 @ 0500  Assessment: Pt is 70 yo female arrived via EMS, admitted for cardiac arrest.  Pt has a limited med hx PTA.   Baseline aPTT: 30 sec.  Baseline INR: 1.1  Baseline CBC: Hgb 10.8, Plt 312   Date   Time    HL: 03/28  1018   0.89 03/28  1728   1.04   Goal of Therapy:  Heparin level 0.3-0.7 units/ml Monitor platelets by anticoagulation protocol: Yes   Plan:  - Heparin level is supratherapeutic. Hold heparin for one hour then decrease heparin infusion to 750 units/hr. - Check HL in 6 hours.  - CBC daily while on heparin.  Dorena Bodo, PharmD 02/28/2021 6:05 PM

## 2021-02-28 NOTE — Progress Notes (Addendum)
Alerted by care RN to discrepancy in TTM orders. Patient being actively cooled to 36 degrees since 17:25, but order set listed for 33 degrees management.  Due to the length of time the patient has been kept normothermic at 36 degrees and considering the evidence of the TTM 2 trial, we will maintain the patient at 36 degrees adjusting her orders as needed.  Initiated heparin drip per cardiology recommendations after discussion with Dr. Genevive Bi at [facility] regarding the fact that initial head CT had been aborted due to hemodynamic instability. Pupils equal and reactive, no current clinical concern for ICH.   Stopped Amiodarone drip due to increasing prolonged QTc & R-on-T PVC's    Julie James, AGACNP-BC Acute Care Nurse Practitioner [facility]   [phone] / [phone] Please see Amion for pager details.

## 2021-02-28 NOTE — Progress Notes (Signed)
NAME:  Julie James, MRN:  [id number], DOB:  03-May-1951, LOS: 1 ADMISSION DATE:  02/27/2021,  History of Present Illness:  Julie James a 70 y.o.femalehere with cardiac arrest. Patient reportedly was in her usual state of health today. She was getting into her house when she reportedly fell backwards, and went unresponsive. Husband states he found her and she was confused and then she became apneic. He immediately began CPR. 911 was called and on fire/rescue arrival, patient was reportedly in a shockable rhythm. She received 2 shocks. On EMS arrival, patient had a James and was in what appeared to be a normal sinus rhythm. She was activated as a code STEMI due to concern for anterior precordial lead elevations, and was sent to the ED. She has been minimally responsive in route. She arrived to the ED with pulses, unresponsive.  Code STEMI was activated however this was deactivated by Dr. Clayborn Bigness on STEMI call.  Patient was intubated in the emergency room due to being unresponsive.  She was to go to the CT scan for head CT however did have an episode of wide-complex tachycardia and was started on amiodarone.  CT head was aborted patient was brought to the ICU.  PCCM has been asked to admit.  Of note the patient was seen at her primary provider's office on 22 February 2021 and was noted to have significant hypertension and bradycardia at that time.  Heart rate was noted to be 39.  G at that time shows sinus rhythm with PVCs and a left bundle branch block per report.  She had losartan/HCTZ 100/12.5 added to her regimen.  Her beta-blocker was decreased to 25 mg daily.  Of note the patient had issues with chlorthalidone causing hypokalemia in the past.    Pertinent  Medical History       Past Medical History:  Diagnosis Date  . Hypercholesteremia   . Hypertension        Patient Active Problem List   Diagnosis Date Noted  . Cardiac arrest ([place]) 02/27/2021  . GERD  (gastroesophageal reflux disease) 08/23/2020  . Needs flu shot 08/23/2020  . Breast cancer screening by mammogram 03/12/2020  . Screening for malignant neoplasm of colon 03/12/2020  . Drug-induced hypokalemia 08/07/2019  . Lipoma of abdomen 07/30/2017  . Lipoma of right thigh 07/30/2017  . Hypertension 06/23/2016  . Hyperlipidemia 06/23/2016  . Thyroid nodule 03/16/2014   Significant Hospital Events: Including procedures, antibiotic start and stop dates in addition to other pertinent events    Endotracheal intubation and mechanical ventilation 02/27/2021  Targeted temperature management 02/27/2021    Micro Data:  MRSA NEG [place] NEG  Antimicrobials:   Anti-infectives (From admission, onward)   None       Interim History / Subjective:  Remains intubated  +hypothermia protocol multiorgan failure    Objective   Blood pressure (!) 127/56, James (!) 42, temperature (!) 96.3 F (35.7 C), temperature source Core, resp. rate 19, height 5\' 6"  (1.676 m), weight 118.1 kg, SpO2 100 %.    Vent Mode: PRVC FiO2 (%):  [50 %] 50 % Set Rate:  [16 bmp] 16 bmp Vt Set:  [450 mL] 450 mL PEEP:  [5 cmH20] 5 cmH20 Plateau Pressure:  [22 cmH20] 22 cmH20   Intake/Output Summary (Last 24 hours) at 02/28/2021 0727 Last data filed at 02/28/2021 0617 Gross per 24 hour  Intake 1675.04 ml  Output 675 ml  Net 1000.04 ml   Filed Weights   02/27/21 1745  Weight:  118.1 kg    REVIEW OF SYSTEMS  PATIENT IS UNABLE TO PROVIDE COMPLETE REVIEW OF SYSTEM S DUE TO SEVERE CRITICAL ILLNESS AND ENCEPHALOPATHY   PHYSICAL EXAMINATION:  GENERAL:critically ill appearing, +resp distress HEAD: Normocephalic, atraumatic.  EYES: Pupils equal, round, reactive to light.  No scleral icterus.  MOUTH: Moist mucosal membrane. NECK: Supple. No thyromegaly. No nodules. No JVD.  PULMONARY: +rhonchi, +wheezing CARDIOVASCULAR: S1 and S2. Regular rate and rhythm. No murmurs, rubs, or gallops.  GASTROINTESTINAL:  Soft, nontender, -distended. Positive bowel sounds.  MUSCULOSKELETAL: No swelling, clubbing, or edema.  NEUROLOGIC: obtunded SKIN:intact,warm,dry    Resolved Hospital Problem list   NA  Assessment & Plan:  70 yo morbidly obese AAF with acute sudden cardiac failure and cardiac arrest due to ischemic cardiomyopathy with severe acute hypoxic resp failure and multiorgan failure failure    Severe ACUTE Hypoxic and Hypercapnic Respiratory Failure -continue Mechanical Ventilator support -continue Bronchodilator Therapy -Wean Fio2 and PEEP as tolerated -VAP/VENT bundle implementation  ACUTE ISCHEMIC CARDIOMYOPATHY SYSTOLIC CARDIAC FAILURE-ECHO PENDING -oxygen as needed -Lasix as tolerated -follow up cardiac enzymes as indicated -follow up cardiology recs   NEUROLOGY Acute toxic metabolic encephalopathy, need for sedation Goal RASS -2 to -3 HYPOTHERMIA PROTOCOL   ACUTE KIDNEY INJURY/Renal Failure -continue Foley Catheter-assess need -Avoid nephrotoxic agents -Follow urine output, BMP -Ensure adequate renal perfusion, optimize oxygenation -Renal dose medications    GI GI PROPHYLAXIS as indicated  NUTRITIONAL STATUS DIET-->TF's as tolerated Constipation protocol as indicated  ENDO - ICU hypoglycemic\Hyperglycemia protocol -check FSBS per protocol  ACUTE ANEMIA- TRANSFUSE AS NEEDED DVT PRX with TED/SCD's ONLY TRANSFUSE if HGB<7     DVT/GI PRX ordered and assessed TRANSFUSIONS AS NEEDED MONITOR FSBS I Assessed the need for Labs I Assessed the need for Foley I Assessed the need for Central Venous Line Family Discussion when available I Assessed the need for Mobilization I made an Assessment of medications to be adjusted accordingly Safety Risk assessment completed  CASE DISCUSSED IN MULTIDISCIPLINARY ROUNDS WITH ICU TEAM   Best practice (evaluated daily)  Diet:NPO Pain/Anxiety/Delirium protocol VAP protocol DVT prophylaxis: HEP DRIP GI prophylaxis:  protonix Disposition:ICU   DVT/GI PRX ordered and assessed TRANSFUSIONS AS NEEDED MONITOR FSBS I Assessed the need for Labs I Assessed the need for Foley I Assessed the need for Central Venous Line Family Discussion when available I Assessed the need for Mobilization I made an Assessment of medications to be adjusted accordingly Safety Risk assessment completed  CASE DISCUSSED IN MULTIDISCIPLINARY ROUNDS WITH ICU TEAM      Labs   CBC: Recent Labs  Lab 02/22/21 1005 02/27/21 1539 02/27/21 1758 02/28/21 0454  WBC 5.3 7.9 11.5* 7.8  NEUTROABS 2,682 2.9  --   --   HGB 12.5 12.2 12.2 10.8*  HCT 39.2 38.2 39.4 33.3*  MCV 89.7 89.5 91.4 87.4  PLT 317 349 260 433    Basic Metabolic Panel: Recent Labs  Lab 02/22/21 1005 02/27/21 1539 02/27/21 1758 02/27/21 2346 02/28/21 0454  NA 141 139  --  136 134*  K 4.1 2.7*  --  3.7 3.7  CL 106 103  --  101 103  CO2 28 23  --  25 21*  GLUCOSE 106* 196*  --  198* 155*  BUN 9 13  --  17 19  CREATININE 0.77 0.94 0.84 1.07* 1.44*  CALCIUM 10.0 9.0  --  8.5* 8.4*  MG  --  2.1 2.1 2.0 1.7  PHOS  --  5.1* 4.4 3.5 4.6  GFR: Estimated Creatinine Clearance: 49.6 mL/min (A) (by C-G formula based on SCr of 1.44 mg/dL (H)). Recent Labs  Lab 02/22/21 1005 02/27/21 1539 02/27/21 1758 02/28/21 0454  PROCALCITON  --   --  <0.10  --   WBC 5.3 7.9 11.5* 7.8    Liver Function Tests: Recent Labs  Lab 02/22/21 1005 02/27/21 1539 02/28/21 0454  AST 15 207* 117*  ALT 13 196* 146*  ALKPHOS  --  77 65  BILITOT 0.5 1.0 0.4  PROT 7.8 7.8 6.7  ALBUMIN  --  4.3 3.6   No results for input(s): LIPASE, AMYLASE in the last 168 hours. No results for input(s): AMMONIA in the last 168 hours.  ABG    Component Value Date/Time   PHART 7.37 02/28/2021 0402   PCO2ART 39 02/28/2021 0402   PO2ART 212 (H) 02/28/2021 0402   HCO3 22.5 02/28/2021 0402   ACIDBASEDEF 2.5 (H) 02/28/2021 0402   O2SAT 99.7 02/28/2021 0402     Coagulation  Profile: Recent Labs  Lab 02/27/21 1758 02/27/21 2346  INR 1.0 1.1    Cardiac Enzymes: No results for input(s): CKTOTAL, CKMB, CKMBINDEX, TROPONINI in the last 168 hours.  HbA1C: Hemoglobin A1C  Date/Time Value Ref Range Status  03/12/2020 10:25 AM 5.2 4.0 - 5.6 % Final    CBG: Recent Labs  Lab 02/27/21 1932 02/27/21 2336 02/28/21 0344 02/28/21 0430  GLUCAP 190* 147* 172* 139*    Allergies Allergies  Allergen Reactions  . Chlorthalidone Other (See Comments)    hypokalemia     Home Medications  Prior to Admission medications   Medication Sig Start Date End Date Taking? Authorizing Provider  amLODipine (NORVASC) 5 MG tablet Take 1 tablet (5 mg total) by mouth daily. 05/21/20  Yes Malfi, Lupita Raider, FNP  losartan-hydrochlorothiazide (HYZAAR) 100-12.5 MG tablet Take 1 tablet by mouth daily. 02/22/21  Yes Kathrine Haddock, NP  metoprolol succinate (TOPROL-XL) 50 MG 24 hr tablet Take 1 tablet (50 mg total) by mouth daily. Take with or immediately following a meal. 11/25/20  Yes Malfi, Lupita Raider, FNP  simvastatin (ZOCOR) 40 MG tablet Take 1 tablet (40 mg total) by mouth at bedtime. 05/21/20  Yes Malfi, Lupita Raider, FNP  losartan (COZAAR) 50 MG tablet Take 1.5 tablets (75 mg total) by mouth daily. 08/23/20 02/22/21  Verl Bangs, FNP      Critical Care Time devoted to patient care services described in this note is 56 minutes.   Overall, patient is critically ill, prognosis is guarded.  Patient with Multiorgan failure and at high risk for cardiac arrest and death.    Corrin Parker, M.D.  [facility]  Medical Director [place] Director [facility] Cardio-Pulmonary Department

## 2021-02-28 NOTE — Procedures (Signed)
Will do eeg tomorrow when pt is getting warmed per ICU provider

## 2021-02-28 NOTE — Progress Notes (Signed)
GOALS OF CARE DISCUSSION  The Clinical status was relayed to family in detail. Husband and daughter  Updated and notified of patients medical condition.  Patient remains unresponsive and will not open eyes to command.    Explained to family course of therapy and the modalities     Family understands the situation.  Patient Remains FULL CODE  Family are satisfied with Plan of action and management. All questions answered  Additional CC time 32 mins   Leydi Winstead Patricia Pesa, M.D.  [facility]  Medical Director [place] Director [facility] Cardio-Pulmonary Department

## 2021-02-28 NOTE — Progress Notes (Signed)
Initial Nutrition Assessment  DOCUMENTATION CODES:   Morbid obesity  INTERVENTION:   -Once re-warmed, recommend:  Initiate Vital High Protein @ 35 ml/hr via OGT  90 ml Prosource TF BID  Tube feeding regimen provides 1000 kcal, 117 grams of protein, and 702 ml of H2O.   TF + propofol at current rate to provide 1546 kcals daily  NUTRITION DIAGNOSIS:   Inadequate oral intake related to inability to eat as evidenced by NPO status.  GOAL:   Patient will meet greater than or equal to 90% of their needs  MONITOR:   Vent status,Labs,Weight trends,Skin,I & O's  REASON FOR ASSESSMENT:   Consult,Ventilator Enteral/tube feeding initiation and management  ASSESSMENT:   Julie James is a 70 y.o. female here with cardiac arrest.  Patient reportedly was in her usual state of health today.  She was getting into her house when she reportedly fell backwards, and went unresponsive.  Pt admitted with acute respiratory failure with hypoxia secondary to cardiac arrest.   Patient is currently intubated on ventilator support MV: 7 L/min Temp (24hrs), Avg:96.6 F (35.9 C), Min:95.4 F (35.2 C), Max:98.1 F (36.7 C)  Propofol: 20.7 ml/hr (546 kcals daily)  Reviewed I/O's: +1 L x 24 hours   UOP: 650 ml x 24 hours  NGT output: 25 ml x 24 hours  Case discussed with MD, RN, and in ICU rounds. Per RN, plan to start re-warming around 1730 today. No plans for TF today, however, plan to re-assess tomorrow after pt is re-warmed.    Reviewed wt hx; wt has been stable over the past 6 months.   Medications reviewed and include levophed and vasopressin.   Labs reviewed: Na: 134, K, Mg, and Phos WDL. CBGS: 139-149 (inpatient orders for glycemic control are none).   NUTRITION - FOCUSED PHYSICAL EXAM:  Flowsheet Row Most Recent Value  Orbital Region No depletion  Upper Arm Region No depletion  Thoracic and Lumbar Region No depletion  Buccal Region No depletion  Temple Region No  depletion  Clavicle Bone Region No depletion  Clavicle and Acromion Bone Region No depletion  Scapular Bone Region No depletion  Dorsal Hand No depletion  Patellar Region No depletion  Anterior Thigh Region No depletion  Posterior Calf Region No depletion  Edema (RD Assessment) Mild  Hair Reviewed  Eyes Reviewed  Mouth Reviewed  Skin Reviewed  Nails Reviewed       Diet Order:   Diet Order            Diet NPO time specified  Diet effective now                 EDUCATION NEEDS:   Not appropriate for education at this time  Skin:  Skin Assessment: Reviewed RN Assessment  Last BM:  Unknown  Height:   Ht Readings from Last 1 Encounters:  02/27/21 5\' 6"  (1.676 m)    Weight:   Wt Readings from Last 1 Encounters:  02/27/21 118.1 kg    Ideal Body Weight:  59.1 kg  BMI:  Body mass index is 42.02 kg/m.  Estimated Nutritional Needs:   Kcal:  2440-1027  Protein:  > 118 grams  Fluid:  > 1.3 L    Loistine Chance, RD, LDN, [place] Registered Dietitian II Certified Diabetes Care and Education Specialist Please refer to [facility] for RD and/or RD on-call/weekend/after hours pager

## 2021-02-28 NOTE — Progress Notes (Signed)
[place] for Heparin infusion Indication: ACS/STEMI  Allergies  Allergen Reactions  . Chlorthalidone Other (See Comments)    hypokalemia    Patient Measurements: Height: 5\' 6"  (167.6 cm) Weight: 118.1 kg (260 lb 5.8 oz) IBW/kg (Calculated) : 59.3 Heparin Dosing Weight: 87.3 kg  Vital Signs: Temp: 96.6 F (35.9 C) (03/28 0300) Temp Source: Core (Comment) (03/28 0300) BP: 92/64 (03/28 0300) Pulse Rate: 42 (03/28 0300)  Labs: Recent Labs    02/27/21 1539 02/27/21 1758 02/27/21 2346  HGB 12.2 12.2  --   HCT 38.2 39.4  --   PLT 349 260  --   APTT  --  30 36  LABPROT  --  12.8 13.5  INR  --  1.0 1.1  CREATININE 0.94 0.84 1.07*  TROPONINIHS 13 105*  --     Estimated Creatinine Clearance: 66.7 mL/min (A) (by C-G formula based on SCr of 1.07 mg/dL (H)).   Medical History: Past Medical History:  Diagnosis Date  . Hypercholesteremia   . Hypertension     Medications:  Pt ordered Lovenox prophylaxis (57.5mg ) with last dose given 3/27 @ 2045.  Assessment: Pt is 70 yo female arrived via EMS, admitted for cardiac arrest.  Pt has a limited med hx PTA.  Goal of Therapy:  Heparin level 0.3-0.7 units/ml Monitor platelets by anticoagulation protocol: Yes   Plan:  Due to Lovenox dose, will not bolus prior to start of heparin Start heparin infusion at 1200 units/hr Check HL in 6 hours after start. CBC daily while on heparin.  Renda Rolls, PharmD, [facility] 02/28/2021 4:18 AM

## 2021-02-28 NOTE — Consult Note (Signed)
Consultation Note Date: 02/28/2021   Patient Name: Julie James  DOB: [birth year]  MRN: [id number]  Age / Sex: 70 y.o., female  PCP: Verl Bangs, FNP Referring Physician: Flora Lipps, MD  Reason for Consultation: Establishing goals of care  HPI/Patient Profile: Julie James a 69 y.o.femalehere with cardiac arrest. Per EMR: She was getting into her house when she reportedly fell backwards, and went unresponsive. Husband states he found her and she was confused and then she became apneic. He immediately began CPR. Per IQN,998 was called and on fire/rescue arrival, patient was reportedly in a shockable rhythm. She received 2 shocks. On EMS arrival, patient had a pulse and was in what appeared to be a normal sinus rhythm. She was activated as a code STEMI due to concern for anterior precordial lead elevations, and was sent to the ED. She has been minimally responsive in route. She arrived to the ED with pulses, unresponsive.  Code STEMI was activated however this was deactivated by Dr. Clayborn Bigness on STEMI call.  Patient was intubated in the emergency room due to being unresponsive.  She was to go to the CT scan for head CT however did have an episode of wide-complex tachycardia and was started on amiodarone.  CT head was aborted patient was brought to the ICU.  PCCM has been asked to admit.  Clinical Assessment and Goals of Care: Patient is resting in bed. Husband is at bedside. He states they are married, but have been living separately since February 10, 2004. He states they remained close seeing each other almost daily when he is around, as he worked "7 days a week". He states they have 2/3 children living. One of his daughters died in 02-09-14 following a long series of events involving lupus and renal failure. He states patient has raised their grandchild who is now 34 years old since her death. He states she  loves to cook and spend time with family.  He states there is a lot that is unknown about her health as she did not share everything.He states seeing her there in bed reminds him of their daughter who they had to make decisions for.   Plans for an EEG today. Palliative will continue to follow.       SUMMARY OF RECOMMENDATIONS   Continue current care. Palliative will follow.    Prognosis:   Unable to determine      Primary Diagnoses: Present on Admission: . Cardiac arrest ([place])   I have reviewed the medical record, interviewed the patient and family, and examined the patient. The following aspects are pertinent.  Past Medical History:  Diagnosis Date  . Hypercholesteremia   . Hypertension    Social History   Socioeconomic History  . Marital status: Married    Spouse name: Not on file  . Number of children: Not on file  . Years of education: Not on file  . Highest education level: Not on file  Occupational History  . Not on file  Tobacco Use  . Smoking status: Never  Smoker  . Smokeless tobacco: Never Used  Vaping Use  . Vaping Use: Never used  Substance and Sexual Activity  . Alcohol use: No  . Drug use: No  . Sexual activity: Not on file  Other Topics Concern  . Not on file  Social History Narrative  . Not on file   Social Determinants of Health   Financial Resource Strain: Not on file  Food Insecurity: Not on file  Transportation Needs: Not on file  Physical Activity: Not on file  Stress: Not on file  Social Connections: Not on file   Family History  Problem Relation Age of Onset  . Cancer Sister        breast  . Cancer Sister        breast   Scheduled Meds: . artificial tears  1 application Both Eyes E0C  . chlorhexidine gluconate (MEDLINE KIT)  15 mL Mouth Rinse BID  . Chlorhexidine Gluconate Cloth  6 each Topical Daily  . free water  30 mL Per Tube Q4H  . mouth rinse  15 mL Mouth Rinse 10 times per day  . pantoprazole (PROTONIX) IV  40 mg  Intravenous QHS  . sodium chloride flush  3 mL Intravenous Q12H   Continuous Infusions: . sodium chloride    . sodium chloride 50 mL/hr at 02/28/21 1500  . sodium chloride    . fentaNYL infusion INTRAVENOUS 200 mcg/hr (02/28/21 1500)  . heparin 1,000 Units/hr (02/28/21 1500)  . norepinephrine (LEVOPHED) Adult infusion 4 mcg/min (02/28/21 1500)  . phenylephrine (NEO-SYNEPHRINE) Adult infusion Stopped (02/28/21 0700)  . propofol (DIPRIVAN) infusion 20 mcg/kg/min (02/28/21 1547)  . vasopressin 0.03 Units/min (02/28/21 1500)   PRN Meds:.sodium chloride, acetaminophen, albuterol, docusate sodium, fentaNYL, ondansetron (ZOFRAN) IV, polyethylene glycol, sodium chloride flush Medications Prior to Admission:  Prior to Admission medications   Medication Sig Start Date End Date Taking? Authorizing Provider  amLODipine (NORVASC) 5 MG tablet Take 1 tablet (5 mg total) by mouth daily. 05/21/20  Yes Malfi, Lupita Raider, FNP  losartan-hydrochlorothiazide (HYZAAR) 100-12.5 MG tablet Take 1 tablet by mouth daily. 02/22/21  Yes Kathrine Haddock, NP  metoprolol succinate (TOPROL-XL) 50 MG 24 hr tablet Take 1 tablet (50 mg total) by mouth daily. Take with or immediately following a meal. 11/25/20  Yes Malfi, Lupita Raider, FNP  simvastatin (ZOCOR) 40 MG tablet Take 1 tablet (40 mg total) by mouth at bedtime. 05/21/20  Yes Malfi, Lupita Raider, FNP  losartan (COZAAR) 50 MG tablet Take 1.5 tablets (75 mg total) by mouth daily. 08/23/20 02/22/21  Verl Bangs, FNP   Allergies  Allergen Reactions  . Chlorthalidone Other (See Comments)    hypokalemia   Review of Systems  Unable to perform ROS   Physical Exam Constitutional:      Comments: Opens eyes to voice. Does not follow commands.      Vital Signs: BP (!) 116/47 (BP Location: Right Wrist)   Pulse (!) 45   Temp (!) 96.6 F (35.9 C) (Core)   Resp 16   Ht 5' 6"  (1.676 m)   Wt 118.1 kg   SpO2 100%   BMI 42.02 kg/m  Pain Scale: CPOT       SpO2: SpO2: 100  % O2 Device:SpO2: 100 % O2 Flow Rate: .   IO: Intake/output summary:   Intake/Output Summary (Last 24 hours) at 02/28/2021 1556 Last data filed at 02/28/2021 1500 Gross per 24 hour  Intake 3968.4 ml  Output 1285 ml  Net 2683.4 ml  LBM: Last BM Date:  (PTA) Baseline Weight: Weight: 118.1 kg Most recent weight: Weight: 118.1 kg         Time In: 3:20 Time Out: 3:50 Time Total: 30 min Greater than 50%  of this time was spent counseling and coordinating care related to the above assessment and plan.  Signed by: Asencion Gowda, NP   Please contact Palliative Medicine Team phone at [phone] for questions and concerns.  For individual provider: See Shea Evans

## 2021-02-28 NOTE — Progress Notes (Signed)
[facility] Cardiology    SUBJECTIVE: Patient intubated sedated   Vitals:   02/28/21 0430 02/28/21 0500 02/28/21 0530 02/28/21 0600  BP: 128/68 112/66 120/70 124/64  Pulse: (!) 44 (!) 50 (!) 42 (!) 47  Resp: 17 16 16 16   Temp:  (!) 97.5 F (36.4 C)  (!) 96.8 F (36 C)  TempSrc:  Core  Core (Comment)  SpO2: 100% 100% 100% 100%  Weight:      Height:         Intake/Output Summary (Last 24 hours) at 02/28/2021 9518 Last data filed at 02/28/2021 0617 Gross per 24 hour  Intake 1675.04 ml  Output 675 ml  Net 1000.04 ml      PHYSICAL EXAM  General: Well developed, well nourished, in no acute distress HEENT:  Normocephalic and atramatic Neck:  No JVD.  Lungs: Clear bilaterally to auscultation and percussion. Heart: HRRR . Normal S1 and S2 without gallops or murmurs.  Abdomen: Bowel sounds are positive, abdomen soft and non-tender  Msk:  Back normal, normal gait. Normal strength and tone for age. Extremities: No clubbing, cyanosis or edema.   Neuro: Alert and oriented X 3. Psych:  Good affect, responds appropriately   LABS: Basic Metabolic Panel: Recent Labs    02/27/21 2346 02/28/21 0454  NA 136 134*  K 3.7 3.7  CL 101 103  CO2 25 21*  GLUCOSE 198* 155*  BUN 17 19  CREATININE 1.07* 1.44*  CALCIUM 8.5* 8.4*  MG 2.0 1.7  PHOS 3.5 4.6   Liver Function Tests: Recent Labs    02/27/21 1539 02/28/21 0454  AST 207* 117*  ALT 196* 146*  ALKPHOS 77 65  BILITOT 1.0 0.4  PROT 7.8 6.7  ALBUMIN 4.3 3.6   No results for input(s): LIPASE, AMYLASE in the last 72 hours. CBC: Recent Labs    02/27/21 1539 02/27/21 1758 02/28/21 0454  WBC 7.9 11.5* 7.8  NEUTROABS 2.9  --   --   HGB 12.2 12.2 10.8*  HCT 38.2 39.4 33.3*  MCV 89.5 91.4 87.4  PLT 349 260 312   Cardiac Enzymes: No results for input(s): CKTOTAL, CKMB, CKMBINDEX, TROPONINI in the last 72 hours. BNP: Invalid input(s): POCBNP D-Dimer: No results for input(s): DDIMER in the last 72 hours. Hemoglobin  A1C: No results for input(s): HGBA1C in the last 72 hours. Fasting Lipid Panel: Recent Labs    02/28/21 0454  TRIG 88   Thyroid Function Tests: Recent Labs    02/27/21 1739  TSH 3.918   Anemia Panel: No results for input(s): VITAMINB12, FOLATE, FERRITIN, TIBC, IRON, RETICCTPCT in the last 72 hours.  DG Chest 1 View  Result Date: 02/27/2021 CLINICAL DATA:  Central line placement EXAM: CHEST  1 VIEW COMPARISON:  02/27/2021 FINDINGS: Support Apparatus: --Endotracheal tube: Tip at the level of the clavicular heads. --Enteric tube:Tip and sideport project over the stomach. --Catheter(s):Left internal jugular vein approach central venous catheter tip is at the lower SVC --Other: None Mild cardiomegaly. No focal airspace consolidation or pulmonary edema. No pneumothorax. No pleural effusion. IMPRESSION: Endotracheal tube tip at the level of the clavicular heads. Electronically Signed   By: Ulyses Jarred M.D.   On: 02/27/2021 20:33   DG Abdomen 1 View  Result Date: 02/27/2021 CLINICAL DATA:  NG tube placement. EXAM: ABDOMEN - 1 VIEW COMPARISON:  Prior studies FINDINGS: An NG tube is noted with tip overlying the mid stomach. No other significant abnormalities noted. IMPRESSION: NG tube with tip overlying the mid stomach. Electronically  Signed   By: Margarette Canada M.D.   On: 02/27/2021 16:29   DG Chest Port 1 View  Result Date: 02/28/2021 CLINICAL DATA:  Respiratory failure EXAM: PORTABLE CHEST 1 VIEW COMPARISON:  Chest radiograph February 27, 2021 FINDINGS: Endotracheal tube with tip overlying the midthoracic trachea. Left IJ central venous catheter with tip overlying the SVC. Nasogastric tube coursing below the diaphragm with tip obscured by collimation. Defibrillator leads overlie the left chest and left upper quadrant. The heart size and mediastinal contours are unchanged. No focal consolidation or overt pulmonary edema. No pleural effusion. No pneumothorax. The visualized skeletal structures are  unchanged. IMPRESSION: 1. No acute cardiopulmonary findings. 2. Stable support apparatus. Electronically Signed   By: Dahlia Bailiff MD   On: 02/28/2021 03:04   DG Chest Portable 1 View  Result Date: 02/27/2021 CLINICAL DATA:  Intubation, cardiac arrest, code STEMI EXAM: PORTABLE CHEST 1 VIEW COMPARISON:  Portable exam 1607 hours compared to 12/13/2019 FINDINGS: Tip of endotracheal tube projects 3.2 cm above carina. Nasogastric tube extends into stomach. EKG leads and external pacing leads present. Enlargement of cardiac silhouette with slight vascular congestion. Atherosclerotic calcification aorta. Mediastinal contours normal. Subsegmental atelectasis LEFT lower lobe. Remaining lungs clear. No pleural effusion or pneumothorax. IMPRESSION: Subsegmental atelectasis LEFT lower lobe. Electronically Signed   By: Lavonia Dana M.D.   On: 02/27/2021 16:30     Echo pending  TELEMETRY: Sinus bradycardia rate of 50 interventricular conduction delay nonspecific findings  ASSESSMENT AND PLAN:  Active Problems:   Cardiac arrest (HCC) Hypokalemia Respiratory failure Obesity Possible obstructive sleep apnea Nonsustained VT Abnormal EKG Prolonged QT Bradycardia  Plan Agree with ICU level care Continue respiratory support and wean when able Agree with discontinuing amiodarone because of significant bradycardia and prolonged QT No clear indication for permanent pacemaker at this point Continue to maintain normal electrolyte levels Consider echocardiogram for assessment of left ventricular function Do not recommend cardiac cath at this stage Recommend continued supportive care for now   Yolonda Kida, MD 02/28/2021 6:58 AM

## 2021-02-28 NOTE — Plan of Care (Signed)
  Problem: Education: Goal: Ability to manage disease process will improve Outcome: Progressing   Problem: Cardiac: Goal: Ability to achieve and maintain adequate cardiopulmonary perfusion will improve Outcome: Progressing   Problem: Neurologic: Goal: Promote progressive neurologic recovery Outcome: Progressing   Problem: Skin Integrity: Goal: Risk for impaired skin integrity will be minimized. Outcome: Progressing

## 2021-03-01 ENCOUNTER — Inpatient Hospital Stay: Payer: Medicare Other

## 2021-03-01 ENCOUNTER — Ambulatory Visit: Payer: Medicare Other | Admitting: Unknown Physician Specialty

## 2021-03-01 DIAGNOSIS — J9601 Acute respiratory failure with hypoxia: Secondary | ICD-10-CM | POA: Diagnosis not present

## 2021-03-01 DIAGNOSIS — I469 Cardiac arrest, cause unspecified: Secondary | ICD-10-CM | POA: Diagnosis not present

## 2021-03-01 LAB — CBC WITH DIFFERENTIAL/PLATELET
Abs Immature Granulocytes: 0.03 10*3/uL (ref 0.00–0.07)
Basophils Absolute: 0 10*3/uL (ref 0.0–0.1)
Basophils Relative: 0 %
Eosinophils Absolute: 0 10*3/uL (ref 0.0–0.5)
Eosinophils Relative: 0 %
HCT: 29.9 % — ABNORMAL LOW (ref 36.0–46.0)
Hemoglobin: 9.9 g/dL — ABNORMAL LOW (ref 12.0–15.0)
Immature Granulocytes: 0 %
Lymphocytes Relative: 18 %
Lymphs Abs: 1.4 10*3/uL (ref 0.7–4.0)
MCH: 28.9 pg (ref 26.0–34.0)
MCHC: 33.1 g/dL (ref 30.0–36.0)
MCV: 87.4 fL (ref 80.0–100.0)
Monocytes Absolute: 0.6 10*3/uL (ref 0.1–1.0)
Monocytes Relative: 7 %
Neutro Abs: 5.8 10*3/uL (ref 1.7–7.7)
Neutrophils Relative %: 75 %
Platelets: 275 10*3/uL (ref 150–400)
RBC: 3.42 MIL/uL — ABNORMAL LOW (ref 3.87–5.11)
RDW: 14.1 % (ref 11.5–15.5)
WBC: 7.8 10*3/uL (ref 4.0–10.5)
nRBC: 0 % (ref 0.0–0.2)

## 2021-03-01 LAB — COMPREHENSIVE METABOLIC PANEL
ALT: 110 U/L — ABNORMAL HIGH (ref 0–44)
AST: 65 U/L — ABNORMAL HIGH (ref 15–41)
Albumin: 3.3 g/dL — ABNORMAL LOW (ref 3.5–5.0)
Alkaline Phosphatase: 64 U/L (ref 38–126)
Anion gap: 5 (ref 5–15)
BUN: 13 mg/dL (ref 8–23)
CO2: 23 mmol/L (ref 22–32)
Calcium: 8.6 mg/dL — ABNORMAL LOW (ref 8.9–10.3)
Chloride: 105 mmol/L (ref 98–111)
Creatinine, Ser: 0.74 mg/dL (ref 0.44–1.00)
GFR, Estimated: >60 mL/min (ref >60)
Glucose, Bld: 127 mg/dL — ABNORMAL HIGH (ref 70–99)
Potassium: 4.6 mmol/L (ref 3.5–5.1)
Sodium: 133 mmol/L — ABNORMAL LOW (ref 135–145)
Total Bilirubin: 0.6 mg/dL (ref 0.3–1.2)
Total Protein: 6.3 g/dL — ABNORMAL LOW (ref 6.5–8.1)

## 2021-03-01 LAB — GLUCOSE, CAPILLARY
Glucose-Capillary: 111 mg/dL — ABNORMAL HIGH (ref 70–99)
Glucose-Capillary: 112 mg/dL — ABNORMAL HIGH (ref 70–99)
Glucose-Capillary: 114 mg/dL — ABNORMAL HIGH (ref 70–99)
Glucose-Capillary: 96 mg/dL (ref 70–99)
Glucose-Capillary: 97 mg/dL (ref 70–99)

## 2021-03-01 LAB — MAGNESIUM: Magnesium: 1.9 mg/dL (ref 1.7–2.4)

## 2021-03-01 LAB — PHOSPHORUS: Phosphorus: 2.9 mg/dL (ref 2.5–4.6)

## 2021-03-01 LAB — HEPARIN LEVEL (UNFRACTIONATED)
Heparin Unfractionated: 0.55 IU/mL (ref 0.30–0.70)
Heparin Unfractionated: 0.46 IU/mL (ref 0.30–0.70)

## 2021-03-01 IMAGING — DX DG CHEST 1V PORT
2 series · 2 of 2 positions shown · non-contrast
Comparison: [DATE]

CLINICAL DATA: Acute respiratory failure

EXAM:
PORTABLE CHEST 1 VIEW

[chest ap (1 of 2)]
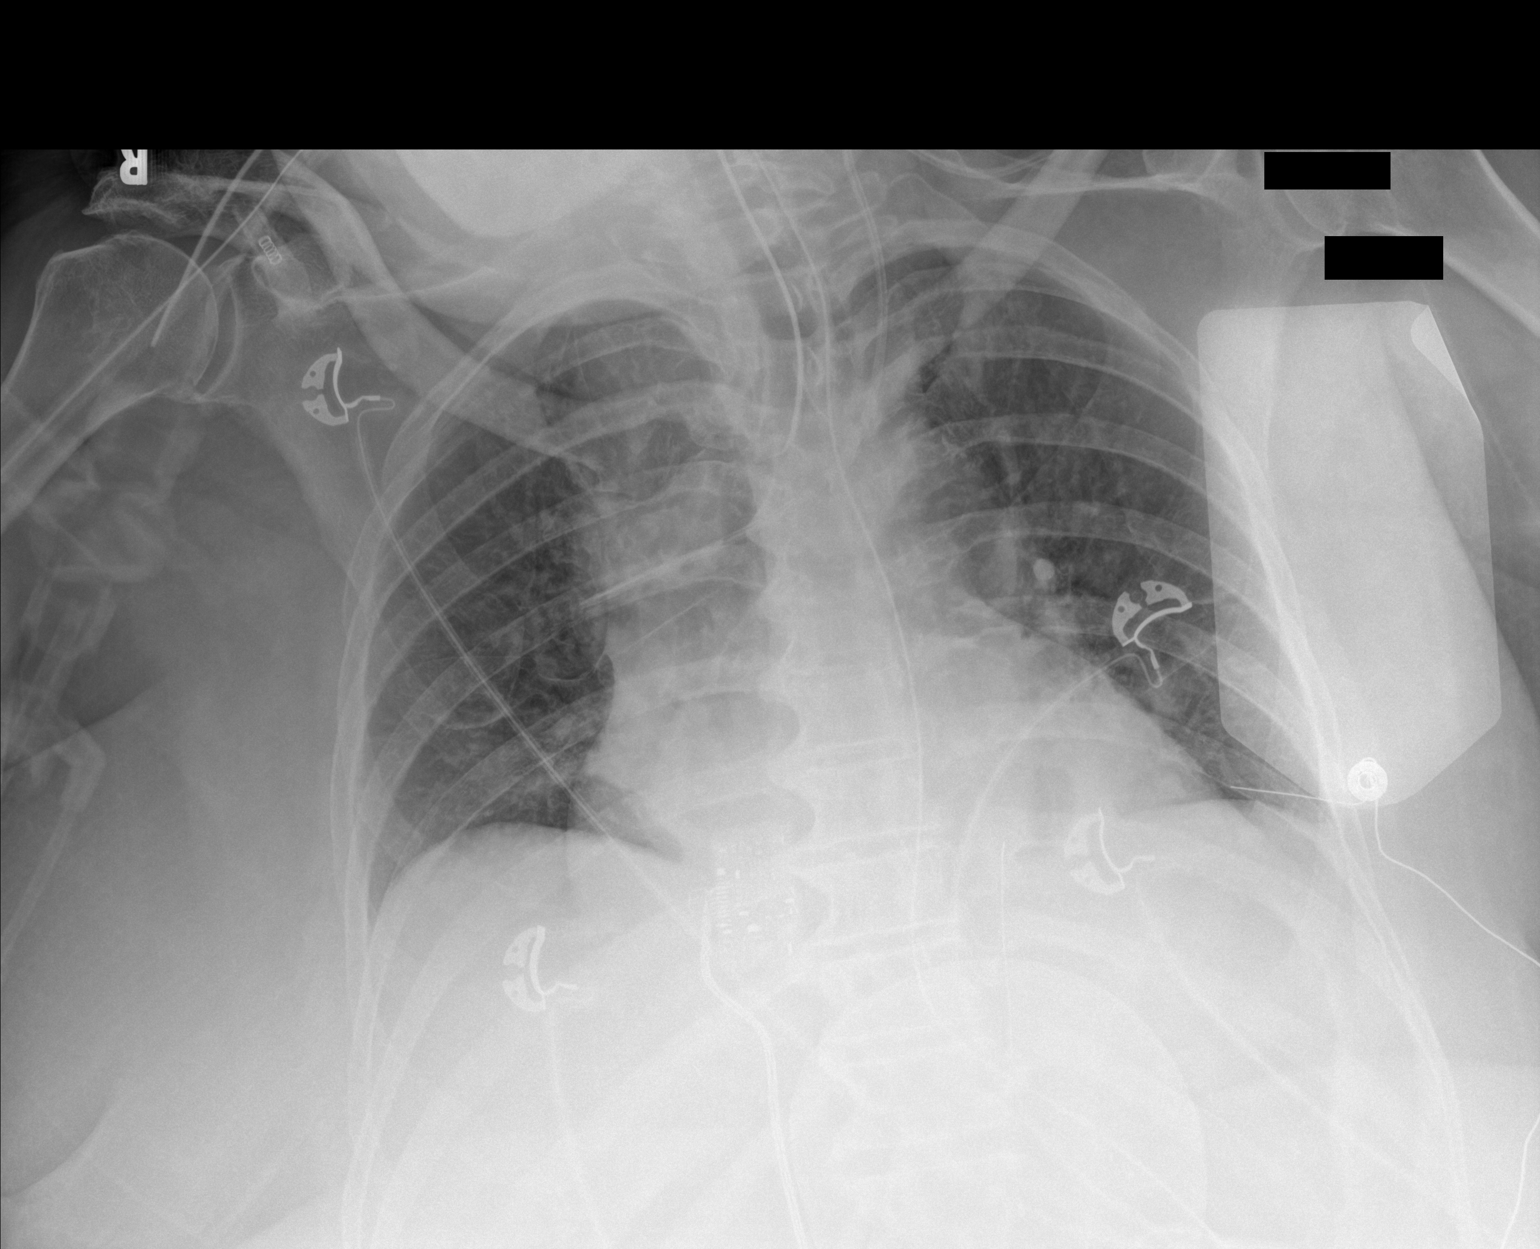

[chest ap (2 of 2)]
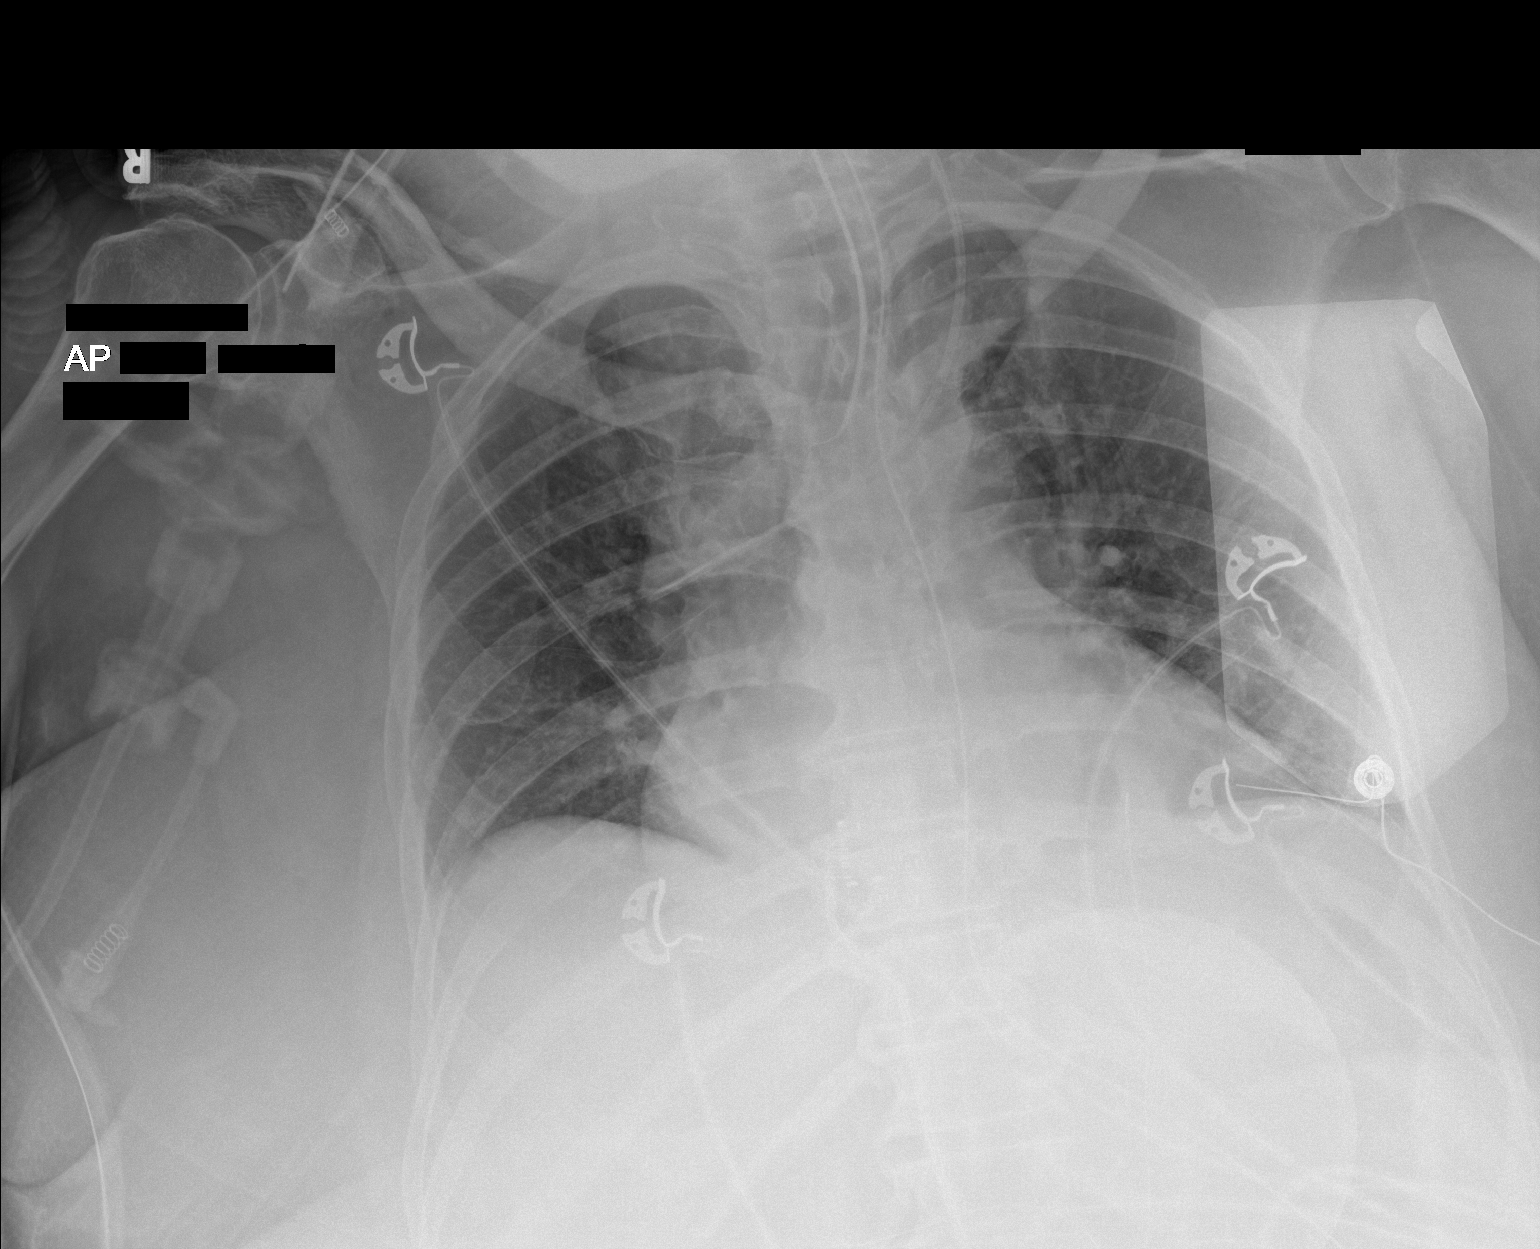

[2 of 2 positions shown; findings below may reference images not displayed]

FINDINGS: Cardiac shadow is mildly enlarged but stable. Endotracheal tube,
gastric catheter and left jugular central line are again seen and
stable. No pneumothorax is noted. Lungs are well aerated
bilaterally. No focal infiltrate or sizable effusion is seen.
IMPRESSION: No acute abnormality noted.

Tubes and lines as described.

## 2021-03-01 MED ORDER — MAGNESIUM SULFATE 2 GM/50ML IV SOLN
2.0000 g | Freq: Once | INTRAVENOUS | Status: AC
  Administered 2021-03-01: 2 g via INTRAVENOUS
  Filled 2021-03-01: qty 50

## 2021-03-01 MED ORDER — ORAL CARE MOUTH RINSE
15.0000 mL | Freq: Two times a day (BID) | OROMUCOSAL | Status: DC
  Administered 2021-03-01 – 2021-03-02 (×3): 15 mL via OROMUCOSAL

## 2021-03-01 MED ORDER — CHLORHEXIDINE GLUCONATE 0.12 % MT SOLN
15.0000 mL | Freq: Two times a day (BID) | OROMUCOSAL | Status: DC
  Administered 2021-03-02 – 2021-03-03 (×4): 15 mL via OROMUCOSAL
  Filled 2021-03-01 (×4): qty 15

## 2021-03-01 NOTE — Progress Notes (Signed)
Pt extubated to [place] at 1005.  1020 vomited small amount, mouth thoroughly suctioned. ST noted on monitor during vomiting episode, EKG captured. Normal WOB following.   1025 Vomited again, yellow bile. Mouth thoroughly swabbed and suctioned. Zofran given per PRN order. Normal WOB afterward.

## 2021-03-01 NOTE — Progress Notes (Signed)
Pt was suctioned prior to extubation for a small amount of secretions. Per Dr. Zoila Shutter order, she was extubated to 2L nasal cannula. No stridor heard.

## 2021-03-01 NOTE — Progress Notes (Signed)
Great night tonight, managed to wean patient off Levophed completely, she has maintained her MAP over 70 all night. She is still responding to voice and will follow commands.

## 2021-03-01 NOTE — Progress Notes (Signed)
[place] for Heparin infusion Indication: ACS/STEMI  Allergies  Allergen Reactions  . Chlorthalidone Other (See Comments)    hypokalemia    Patient Measurements: Height: 5' 5.98" (167.6 cm) Weight: 118.1 kg (260 lb 5.8 oz) IBW/kg (Calculated) : 59.26 Heparin Dosing Weight: 87.3 kg  Vital Signs: Temp: 98.6 F (37 C) (03/29 0200) Temp Source: Core (03/29 0200) BP: 124/64 (03/29 0200) Pulse Rate: 50 (03/29 0200)  Labs: Recent Labs    02/27/21 1758 02/27/21 2346 02/28/21 0454 02/28/21 0720 02/28/21 1018 02/28/21 1728 03/01/21 0204  HGB 12.2  --  10.8*  --   --   --  9.9*  HCT 39.4  --  33.3*  --   --   --  29.9*  PLT 260  --  312  --   --   --  275  APTT 30 36  --   --   --   --   --   LABPROT 12.8 13.5  --   --   --   --   --   INR 1.0 1.1  --   --   --   --   --   HEPARINUNFRC  --   --   --   --  0.89* 1.04* 0.55  CREATININE 0.84 1.07* 1.44*  --   --   --  0.74  TROPONINIHS 105*  --  502* 408*  --   --   --     Estimated Creatinine Clearance: 89.2 mL/min (by C-G formula based on SCr of 0.74 mg/dL).   Medical History: Past Medical History:  Diagnosis Date  . Hypercholesteremia   . Hypertension     Medications:  Pt ordered Lovenox prophylaxis (57.5mg ) with last dose given 3/27 @ 2045. Heparin started 3/28 @ 0500  Assessment: Pt is 70 yo female arrived via EMS, admitted for cardiac arrest.  Pt has a limited med hx PTA.   Baseline aPTT: 30 sec.  Baseline INR: 1.1  Baseline CBC: Hgb 10.8, Plt 312   Date   Time    HL: 03/28  1018   0.89 03/28  1728   1.04  03/29  0204   0.55   Goal of Therapy:  Heparin level 0.3-0.7 units/ml Monitor platelets by anticoagulation protocol: Yes   Plan:  3/29:  HL @ 0204 = 0.55, therapeutic X 1  Will continue pt on current rate and recheck HL on 3/29 @ 0800.   Samauri Kellenberger D 03/01/2021 2:57 AM

## 2021-03-01 NOTE — Progress Notes (Signed)
Daily Progress Note   Patient Name: Julie James       Date: 03/01/2021 DOB: 08/29/1951  Age: 70 y.o. MRN#: [id number] Attending Physician: Flora Lipps, MD Primary Care Physician: Verl Bangs, FNP Admit Date: 02/27/2021  Reason for Consultation/Follow-up: Establishing goals of care  Subjective: Staff is currently working with patient.  She is extubated and is alert. Per nursing she follows commands such as squeezing and hands and wiggling toes. Unable to complete MRI today due ot inability to follow directions to lay still for imaging.  She has thus far been aphasic. No family at bedside. Called to speak with husband. He states he has been updated and is encouraged by her progress. Continue full scope/full code at this time.    Length of Stay: 2  Current Medications: Scheduled Meds:  . [START ON 03/02/2021] chlorhexidine  15 mL Mouth Rinse BID  . Chlorhexidine Gluconate Cloth  6 each Topical Daily  . mouth rinse  15 mL Mouth Rinse q12n4p  . pantoprazole (PROTONIX) IV  40 mg Intravenous QHS  . sodium chloride flush  3 mL Intravenous Q12H    Continuous Infusions: . sodium chloride      PRN Meds: sodium chloride, acetaminophen, albuterol, docusate sodium, ondansetron (ZOFRAN) IV, polyethylene glycol, sodium chloride flush  Physical Exam Pulmonary:     Effort: Pulmonary effort is normal.  Neurological:     Mental Status: She is alert.             Vital Signs: BP (!) 141/59   Pulse 64   Temp (!) 97.3 F (36.3 C) (Core)   Resp 16   Ht 5' 5.98" (1.676 m)   Wt 120 kg   SpO2 100%   BMI 42.72 kg/m  SpO2: SpO2: 100 % O2 Device: O2 Device: Nasal Cannula O2 Flow Rate:    Intake/output summary:   Intake/Output Summary (Last 24 hours) at 03/01/2021 1504 Last data filed  at 03/01/2021 1200 Gross per 24 hour  Intake 1810.76 ml  Output 1045 ml  Net 765.76 ml   LBM: Last BM Date:  (PTA) Baseline Weight: Weight: 118.1 kg Most recent weight: Weight: 120 kg        Patient Active Problem List   Diagnosis Date Noted  . Cardiac arrest ([place]) 02/27/2021  . GERD (gastroesophageal reflux disease) 08/23/2020  .  Needs flu shot 08/23/2020  . Breast cancer screening by mammogram 03/12/2020  . Screening for malignant neoplasm of colon 03/12/2020  . Drug-induced hypokalemia 08/07/2019  . Lipoma of abdomen 07/30/2017  . Lipoma of right thigh 07/30/2017  . Hypertension 06/23/2016  . Hyperlipidemia 06/23/2016  . Thyroid nodule 03/16/2014    Palliative Care Assessment & Plan    Recommendations/Plan: Full code/full scope  Code Status: Code Status History    Date Active Date Inactive Code Status Order ID Comments User Context   02/28/2021 0159 03/01/2021 1106 Full Code [id number]  Rust-ChesterHuel Cote, NP Inpatient   02/27/2021 9810 02/28/2021 0159 Full Code [id number]  Tyler Pita, MD ED   02/27/2021 1625 02/27/2021 1634 Full Code [id number]  Tyler Pita, MD ED   08/07/2019 0931 08/08/2019 1822 Full Code [id number]  Lang Snow, NP ED   Advance Care Planning Activity    Questions for Most Recent Historical Code Status (Order [id number])       Prognosis:  Unable to determine   Thank you for allowing the Palliative Medicine Team to assist in the care of this patient.   Total Time 15 min Prolonged Time Billed  no      Greater than 50%  of this time was spent counseling and coordinating care related to the above assessment and plan.  Asencion Gowda, NP  Please contact Palliative Medicine Team phone at [phone] for questions and concerns.

## 2021-03-01 NOTE — Progress Notes (Signed)
[place] for Heparin infusion Indication: ACS/STEMI  Allergies  Allergen Reactions  . Chlorthalidone Other (See Comments)    hypokalemia    Patient Measurements: Height: 5' 5.98" (167.6 cm) Weight: 120 kg (264 lb 8.8 oz) IBW/kg (Calculated) : 59.26 Heparin Dosing Weight: 87.3 kg  Vital Signs: Temp: 98.8 F (37.1 C) (03/29 0800) Temp Source: Core (03/29 0800) BP: 111/44 (03/29 0700) Pulse Rate: 53 (03/29 0800)  Labs: Recent Labs    02/27/21 1758 02/27/21 2346 02/28/21 0454 02/28/21 0720 02/28/21 1018 02/28/21 1728 03/01/21 0204 03/01/21 0807  HGB 12.2  --  10.8*  --   --   --  9.9*  --   HCT 39.4  --  33.3*  --   --   --  29.9*  --   PLT 260  --  312  --   --   --  275  --   APTT 30 36  --   --   --   --   --   --   LABPROT 12.8 13.5  --   --   --   --   --   --   INR 1.0 1.1  --   --   --   --   --   --   HEPARINUNFRC  --   --   --   --    < > 1.04* 0.55 0.46  CREATININE 0.84 1.07* 1.44*  --   --   --  0.74  --   TROPONINIHS 105*  --  502* 408*  --   --   --   --    < > = values in this interval not displayed.    Estimated Creatinine Clearance: 90.1 mL/min (by C-G formula based on SCr of 0.74 mg/dL).   Medical History: Past Medical History:  Diagnosis Date  . Hypercholesteremia   . Hypertension    Assessment: Pt is 70 yo female arrived via EMS, admitted for cardiac arrest.  Pt has a limited med hx PTA.   Baseline aPTT: 30 sec.  Baseline INR: 1.1  Baseline CBC: Hgb 10.8, Plt 312   Date   Time    HL: 03/28  1018   0.89 03/28  1728   1.04  03/29  0204   0.55  03/29  0807   0.46  CBC: stable   Goal of Therapy:  Heparin level 0.3-0.7 units/ml Monitor platelets by anticoagulation protocol: Yes   Plan:  3/29:  HL at 0807 = 0.46, therapeutic X 2  Will continue pt on current rate and recheck HL with AM labs.  Continue to monitor CBC daily while on heparin.  Doreatha Massed, Student-PharmD 03/01/2021 8:37 AM

## 2021-03-01 NOTE — Progress Notes (Signed)
70 y/o F who is s/p acute cardiac arrest, resp failure-all resolved, patient is extubated, now SD status, she is alert and awake follows commands, she is aphasic, needs CT/RMI but can not lay flat, cardiology following, dx ischemic cardiomyopathy,   Hospitalist service will take over care tomorrow.  This is non-billable note.

## 2021-03-01 NOTE — Progress Notes (Signed)
NAME:  Grisell Bissette, MRN:  [id number], DOB:  04-Jun-1951, LOS: 2 ADMISSION DATE:  02/27/2021,  History of Present Illness:  Julie James a 70 y.o.femalehere with cardiac arrest. Patient reportedly was in her usual state of health today. She was getting into her house when she reportedly fell backwards, and went unresponsive. Husband states he found her and she was confused and then she became apneic. He immediately began CPR. 911 was called and on fire/rescue arrival, patient was reportedly in a shockable rhythm. She received 2 shocks. On EMS arrival, patient had a pulse and was in what appeared to be a normal sinus rhythm. She was activated as a code STEMI due to concern for anterior precordial lead elevations, and was sent to the ED. She has been minimally responsive in route. She arrived to the ED with pulses, unresponsive.  Code STEMI was activated however this was deactivated by Dr. Clayborn Bigness on STEMI call.  Patient was intubated in the emergency room due to being unresponsive.  She was to go to the CT scan for head CT however did have an episode of wide-complex tachycardia and was started on amiodarone.  CT head was aborted patient was brought to the ICU.  PCCM has been asked to admit.  Of note the patient was seen at her primary provider's office on 22 February 2021 and was noted to have significant hypertension and bradycardia at that time.  Heart rate was noted to be 39.  G at that time shows sinus rhythm with PVCs and a left bundle branch block per report.  She had losartan/HCTZ 100/12.5 added to her regimen.  Her beta-blocker was decreased to 25 mg daily.  Of note the patient had issues with chlorthalidone causing hypokalemia in the past.    Pertinent  Medical History       Past Medical History:  Diagnosis Date  . Hypercholesteremia   . Hypertension        Patient Active Problem List   Diagnosis Date Noted  . Cardiac arrest ([place]) 02/27/2021  . GERD  (gastroesophageal reflux disease) 08/23/2020  . Needs flu shot 08/23/2020  . Breast cancer screening by mammogram 03/12/2020  . Screening for malignant neoplasm of colon 03/12/2020  . Drug-induced hypokalemia 08/07/2019  . Lipoma of abdomen 07/30/2017  . Lipoma of right thigh 07/30/2017  . Hypertension 06/23/2016  . Hyperlipidemia 06/23/2016  . Thyroid nodule 03/16/2014   Significant Hospital Events: Including procedures, antibiotic start and stop dates in addition to other pertinent events    Endotracheal intubation and mechanical ventilation 02/27/2021  Targeted temperature management 02/27/2021  3/28 severe resp failure, hypothermia protocol, family updated 3/29 severe respf ailure, cardiac failure rewarming phase   Micro Data:  MRSA NEG [place] NEG  Antimicrobials:   Anti-infectives (From admission, onward)   None       Interim History / Subjective:  Remains on vent Rewarming phase Multiorgan failure Plan for MRI brain today   Objective   Blood pressure (!) 111/44, pulse (!) 53, temperature 98.8 F (37.1 C), temperature source Core, resp. rate 16, height 5' 5.98" (1.676 m), weight 120 kg, SpO2 100 %.    Vent Mode: PRVC FiO2 (%):  [30 %-45 %] 30 % Set Rate:  [16 bmp] 16 bmp Vt Set:  [450 mL] 450 mL PEEP:  [5 cmH20] 5 cmH20 Plateau Pressure:  [25 cmH20] 25 cmH20   Intake/Output Summary (Last 24 hours) at 03/01/2021 0802 Last data filed at 03/01/2021 4627 Gross per 24 hour  Intake  1501.61 ml  Output 1030 ml  Net 471.61 ml   Filed Weights   02/27/21 1745 03/01/21 0500  Weight: 118.1 kg 120 kg     REVIEW OF SYSTEMS  PATIENT IS UNABLE TO PROVIDE COMPLETE REVIEW OF SYSTEMS DUE TO SEVERE CRITICAL ILLNESS AND TOXIC METABOLIC ENCEPHALOPATHY   PHYSICAL EXAMINATION:  GENERAL:critically ill appearing, +resp distress HEAD: Normocephalic, atraumatic.  EYES: Pupils equal, round, reactive to light.  No scleral icterus.  MOUTH: Moist mucosal membrane. NECK:  Supple. No thyromegaly. No nodules. No JVD.  PULMONARY: +rhonchi, +wheezing CARDIOVASCULAR: S1 and S2. Regular rate and rhythm. No murmurs, rubs, or gallops.  GASTROINTESTINAL: Soft, nontender, -distended. Positive bowel sounds.  MUSCULOSKELETAL: No swelling, clubbing, or edema.  NEUROLOGIC: obtunded SKIN:intact,warm,dry       Resolved Hospital Problem list   NA  Assessment & Plan:  70 yo morbidly obese AAF with acute sudden cardiac failure and cardiac arrest due to ischemic cardiomyopathy with severe acute hypoxic resp failure and multiorgan failure failure   Severe ACUTE Hypoxic and Hypercapnic Respiratory Failure -continue Mechanical Ventilator support -continue Bronchodilator Therapy -Wean Fio2 and PEEP as tolerated -VAP/VENT bundle implementation -will perform SAT/SBT when respiratory parameters are met   ACUTE SYSTOLIC CARDIAC FAILURE- ISCHEMIC CARDIOMYOPATHY -follow up cardiac enzymes as indicated -follow up cardiology recs   NEUROLOGY ACUTE TOXIC METABOLIC ENCEPHALOPATHY -need for sedation -Goal RASS -2 to -3 Plan for MRI brain today   ACUTE KIDNEY INJURY/Renal Failure -continue Foley Catheter-assess need -Avoid nephrotoxic agents -Follow urine output, BMP -Ensure adequate renal perfusion, optimize oxygenation -Renal dose medications  Intake/Output Summary (Last 24 hours) at 03/01/2021 0805 Last data filed at 03/01/2021 5027 Gross per 24 hour  Intake 1501.61 ml  Output 1030 ml  Net 471.61 ml   BMP Latest Ref Rng & Units 03/01/2021 02/28/2021 02/27/2021  Glucose 70 - 99 mg/dL 127(H) 155(H) 198(H)  BUN 8 - 23 mg/dL 13 19 17   Creatinine 0.44 - 1.00 mg/dL 0.74 1.44(H) 1.07(H)  BUN/Creat Ratio 6 - 22 (calc) - - -  Sodium 135 - 145 mmol/L 133(L) 134(L) 136  Potassium 3.5 - 5.1 mmol/L 4.6 3.7 3.7  Chloride 98 - 111 mmol/L 105 103 101  CO2 22 - 32 mmol/L 23 21(L) 25  Calcium 8.9 - 10.3 mg/dL 8.6(L) 8.4(L) 8.5(L)       GI GI PROPHYLAXIS as  indicated  NUTRITIONAL STATUS DIET-->TF's as tolerated Constipation protocol as indicated   ENDO - ICU hypoglycemic\Hyperglycemia protocol -check FSBS per protocol  ELECTROLYTES -follow labs as needed -replace as needed -pharmacy consultation and following   ACUTE ANEMIA- TRANSFUSE AS NEEDED CONSIDER TRANSFUSION  IF HGB<7 DVT PRX with TED/SCD's ONLY     Best practice (evaluated daily)  Diet:NPO Pain/Anxiety/Delirium protocol VAP protocol DVT prophylaxis: HEP DRIP GI prophylaxis: protonix Disposition:ICU       Labs   CBC: Recent Labs  Lab 02/22/21 1005 02/27/21 1539 02/27/21 1758 02/28/21 0454 03/01/21 0204  WBC 5.3 7.9 11.5* 7.8 7.8  NEUTROABS 2,682 2.9  --   --  5.8  HGB 12.5 12.2 12.2 10.8* 9.9*  HCT 39.2 38.2 39.4 33.3* 29.9*  MCV 89.7 89.5 91.4 87.4 87.4  PLT 317 349 260 312 741    Basic Metabolic Panel: Recent Labs  Lab 02/22/21 1005 02/27/21 1539 02/27/21 1758 02/27/21 2346 02/28/21 0454 03/01/21 0204  NA 141 139  --  136 134* 133*  K 4.1 2.7*  --  3.7 3.7 4.6  CL 106 103  --  101 103 105  CO2 28 23  --  25 21* 23  GLUCOSE 106* 196*  --  198* 155* 127*  BUN 9 13  --  17 19 13   CREATININE 0.77 0.94 0.84 1.07* 1.44* 0.74  CALCIUM 10.0 9.0  --  8.5* 8.4* 8.6*  MG  --  2.1 2.1 2.0 1.7 1.9  PHOS  --  5.1* 4.4 3.5 4.6 2.9   GFR: Estimated Creatinine Clearance: 90.1 mL/min (by C-G formula based on SCr of 0.74 mg/dL). Recent Labs  Lab 02/27/21 1539 02/27/21 1758 02/28/21 0454 03/01/21 0204  PROCALCITON  --  <0.10  --   --   WBC 7.9 11.5* 7.8 7.8    Liver Function Tests: Recent Labs  Lab 02/22/21 1005 02/27/21 1539 02/28/21 0454 03/01/21 0204  AST 15 207* 117* 65*  ALT 13 196* 146* 110*  ALKPHOS  --  77 65 64  BILITOT 0.5 1.0 0.4 0.6  PROT 7.8 7.8 6.7 6.3*  ALBUMIN  --  4.3 3.6 3.3*   No results for input(s): LIPASE, AMYLASE in the last 168 hours. No results for input(s): AMMONIA in the last 168 hours.  ABG     Component Value Date/Time   PHART 7.37 02/28/2021 0402   PCO2ART 39 02/28/2021 0402   PO2ART 212 (H) 02/28/2021 0402   HCO3 22.5 02/28/2021 0402   ACIDBASEDEF 2.5 (H) 02/28/2021 0402   O2SAT 99.7 02/28/2021 0402     Coagulation Profile: Recent Labs  Lab 02/27/21 1758 02/27/21 2346  INR 1.0 1.1    Cardiac Enzymes: No results for input(s): CKTOTAL, CKMB, CKMBINDEX, TROPONINI in the last 168 hours.  HbA1C: Hemoglobin A1C  Date/Time Value Ref Range Status  03/12/2020 10:25 AM 5.2 4.0 - 5.6 % Final    CBG: Recent Labs  Lab 02/28/21 1642 02/28/21 1933 02/28/21 2310 03/01/21 0358 03/01/21 0751  GLUCAP 119* 114* 95 111* 97    Allergies Allergies  Allergen Reactions  . Chlorthalidone Other (See Comments)    hypokalemia     Home Medications  Prior to Admission medications   Medication Sig Start Date End Date Taking? Authorizing Provider  amLODipine (NORVASC) 5 MG tablet Take 1 tablet (5 mg total) by mouth daily. 05/21/20  Yes Malfi, Lupita Raider, FNP  losartan-hydrochlorothiazide (HYZAAR) 100-12.5 MG tablet Take 1 tablet by mouth daily. 02/22/21  Yes Kathrine Haddock, NP  metoprolol succinate (TOPROL-XL) 50 MG 24 hr tablet Take 1 tablet (50 mg total) by mouth daily. Take with or immediately following a meal. 11/25/20  Yes Malfi, Lupita Raider, FNP  simvastatin (ZOCOR) 40 MG tablet Take 1 tablet (40 mg total) by mouth at bedtime. 05/21/20  Yes Malfi, Lupita Raider, FNP  losartan (COZAAR) 50 MG tablet Take 1.5 tablets (75 mg total) by mouth daily. 08/23/20 02/22/21  Malfi, Lupita Raider, FNP     DVT/GI PRX ordered and assessed TRANSFUSIONS AS NEEDED MONITOR FSBS I Assessed the need for Labs I Assessed the need for Foley I Assessed the need for Central Venous Line Family Discussion when available I Assessed the need for Mobilization I made an Assessment of medications to be adjusted accordingly Safety Risk assessment completed  CASE DISCUSSED IN MULTIDISCIPLINARY ROUNDS WITH ICU  TEAM     Critical Care Time devoted to patient care services described in this note is 48 minutes.   Overall, patient is critically ill, prognosis is guarded.  Patient with Multiorgan failure and at high risk for cardiac arrest and death.    Corrin Parker, M.D.  [facility]  Pulmonary & Critical Care Medicine  Medical Director [place] Director [facility] Cardio-Pulmonary Department

## 2021-03-01 NOTE — Progress Notes (Addendum)
Patient unable to tolerate MRI scan.  Was able to lie flat without respiratory distress, but was unable to follow commands to stay still and it was unclear if she fully understood the test process. Patient was tearful at end of the exam and was attempting to get off the table.

## 2021-03-01 NOTE — Progress Notes (Signed)
Patient able to nod appropriately to yes/no questions. Shakes head no when asked if she is in pain, nods head yes when asked if she understands questions. When prompted, she clears her throat and was able to make an "ah" sound. Able to open mouth, and stick out tongue for oral care. Remains unable to say any words.   Upper and lower extremity function intact. Squeezes hands and wiggles toes on command. Able to shift her position in bed independently.

## 2021-03-01 NOTE — Progress Notes (Addendum)
Nutrition Follow Up Note   DOCUMENTATION CODES:   Morbid obesity  INTERVENTION:   RD will add supplements once diet advanced and pending SLP recommendations   NUTRITION DIAGNOSIS:   Inadequate oral intake related to inability to eat as evidenced by NPO status.  GOAL:   Patient will meet greater than or equal to 90% of their needs  -not met   MONITOR:   Diet advancement,Labs,Weight trends,Skin,I & O's  ASSESSMENT:   70 y.o. female here with cardiac arrest.   Pt extubated today; pt tolerating well. SLP evaluation pending; likely tomorrow. RD will add supplements once diet advanced.   Medications reviewed and include: protonix  Labs reviewed: Na 133(L), P 2.9 wnl, Mg 1.9 wnl  Hgb 9.9(L), Hct 29.9(L)  Diet Order:   Diet Order            Diet regular Room service appropriate? Yes with Assist; Fluid consistency: Thin  Diet effective now                EDUCATION NEEDS:   Not appropriate for education at this time  Skin:  Skin Assessment: Reviewed RN Assessment  Last BM:  Unknown  Height:   Ht Readings from Last 1 Encounters:  03/01/21 5' 5.98" (1.676 m)    Weight:   Wt Readings from Last 1 Encounters:  03/01/21 120 kg    Ideal Body Weight:  59.1 kg  BMI:  Body mass index is 42.72 kg/m.  Estimated Nutritional Needs:   Kcal:  2200-2500kcal/day  Protein:  110-125g/day  Fluid:  1.8-2.1L/day  Koleen Distance MS, RD, LDN Please refer to [facility] for RD and/or RD on-call/weekend/after hours pager

## 2021-03-02 ENCOUNTER — Inpatient Hospital Stay: Payer: Medicare Other

## 2021-03-02 ENCOUNTER — Inpatient Hospital Stay (HOSPITAL_COMMUNITY)
Admit: 2021-03-02 | Discharge: 2021-03-02 | Disposition: A | Discharge status: Home / Self Care | Payer: Medicare Other | Attending: Pulmonary Disease | Admitting: Pulmonary Disease

## 2021-03-02 DIAGNOSIS — I469 Cardiac arrest, cause unspecified: Secondary | ICD-10-CM

## 2021-03-02 DIAGNOSIS — G9341 Metabolic encephalopathy: Secondary | ICD-10-CM

## 2021-03-02 DIAGNOSIS — N179 Acute kidney failure, unspecified: Secondary | ICD-10-CM

## 2021-03-02 DIAGNOSIS — Z515 Encounter for palliative care: Secondary | ICD-10-CM

## 2021-03-02 LAB — COMPREHENSIVE METABOLIC PANEL
ALT: 81 U/L — ABNORMAL HIGH (ref 0–44)
AST: 43 U/L — ABNORMAL HIGH (ref 15–41)
Albumin: 3.4 g/dL — ABNORMAL LOW (ref 3.5–5.0)
Alkaline Phosphatase: 66 U/L (ref 38–126)
Anion gap: 5 (ref 5–15)
BUN: 11 mg/dL (ref 8–23)
CO2: 26 mmol/L (ref 22–32)
Calcium: 9 mg/dL (ref 8.9–10.3)
Chloride: 106 mmol/L (ref 98–111)
Creatinine, Ser: 0.74 mg/dL (ref 0.44–1.00)
GFR, Estimated: >60 mL/min (ref >60)
Glucose, Bld: 106 mg/dL — ABNORMAL HIGH (ref 70–99)
Potassium: 4.4 mmol/L (ref 3.5–5.1)
Sodium: 137 mmol/L (ref 135–145)
Total Bilirubin: 0.8 mg/dL (ref 0.3–1.2)
Total Protein: 6.6 g/dL (ref 6.5–8.1)

## 2021-03-02 LAB — CBC WITH DIFFERENTIAL/PLATELET
Abs Immature Granulocytes: 0.05 10*3/uL (ref 0.00–0.07)
Basophils Absolute: 0 10*3/uL (ref 0.0–0.1)
Basophils Relative: 0 %
Eosinophils Absolute: 0 10*3/uL (ref 0.0–0.5)
Eosinophils Relative: 0 %
HCT: 30.8 % — ABNORMAL LOW (ref 36.0–46.0)
Hemoglobin: 9.9 g/dL — ABNORMAL LOW (ref 12.0–15.0)
Immature Granulocytes: 1 %
Lymphocytes Relative: 11 %
Lymphs Abs: 1.1 10*3/uL (ref 0.7–4.0)
MCH: 28.5 pg (ref 26.0–34.0)
MCHC: 32.1 g/dL (ref 30.0–36.0)
MCV: 88.8 fL (ref 80.0–100.0)
Monocytes Absolute: 0.7 10*3/uL (ref 0.1–1.0)
Monocytes Relative: 7 %
Neutro Abs: 7.6 10*3/uL (ref 1.7–7.7)
Neutrophils Relative %: 81 %
Platelets: 264 10*3/uL (ref 150–400)
RBC: 3.47 MIL/uL — ABNORMAL LOW (ref 3.87–5.11)
RDW: 14.3 % (ref 11.5–15.5)
WBC: 9.4 10*3/uL (ref 4.0–10.5)
nRBC: 0 % (ref 0.0–0.2)

## 2021-03-02 LAB — ECHOCARDIOGRAM COMPLETE
AR max vel: 2.42 cm2
AV Area VTI: 2.5 cm2
AV Area mean vel: 2.38 cm2
AV Mean grad: 9 mmHg
AV Peak grad: 15.7 mmHg
Ao pk vel: 1.98 m/s
Area-P 1/2: 5.16 cm2
Height: 65.984 in
MV VTI: 3.58 cm2
S' Lateral: 3.1 cm
Weight: 4232.83 oz

## 2021-03-02 LAB — GLUCOSE, CAPILLARY: Glucose-Capillary: 98 mg/dL (ref 70–99)

## 2021-03-02 IMAGING — MR MR HEAD W/O CM
12 series · 48 of 48 positions shown · non-contrast
Comparison: Head CT [DATE]

CLINICAL DATA: Anoxic brain damage.  Status post cardiac arrest.

EXAM:
MRI HEAD WITHOUT CONTRAST
TECHNIQUE: Multiplanar, multiecho pulse sequences of the brain and surrounding
structures were obtained without intravenous contrast.

[Series 7: ax dwi_tracew · axial · 3.0mm · 0.71mm/px · z∈[-52,+108]mm · 3 of 56 slices shown]
[im 1/56]
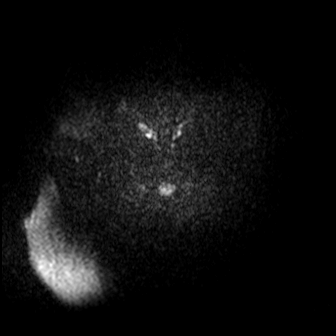
[im 28/56]
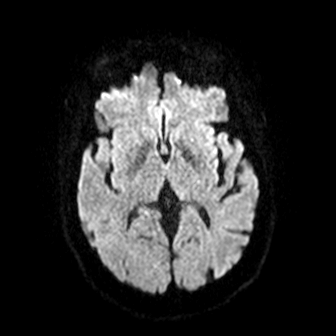
[im 56/56]
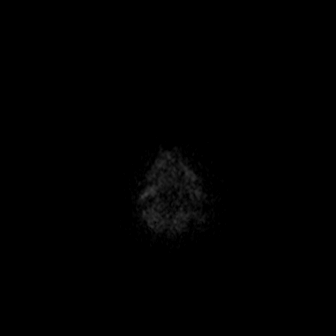

[Series 8: ax dwi_adc · axial · 3.0mm · 0.71mm/px · z∈[-52,+108]mm · 4 of 56 slices shown]
[im 1/56]
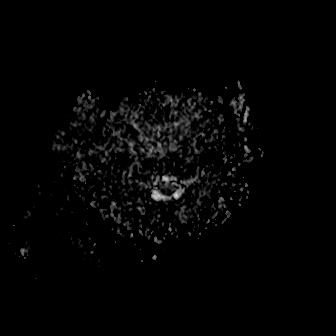
[im 19/56]
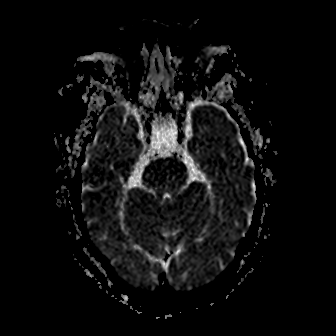
[im 37/56]
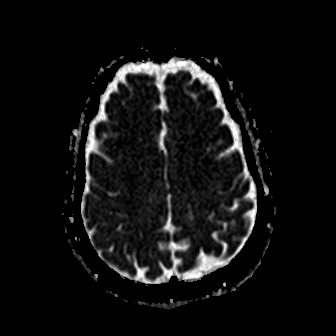
[im 56/56]
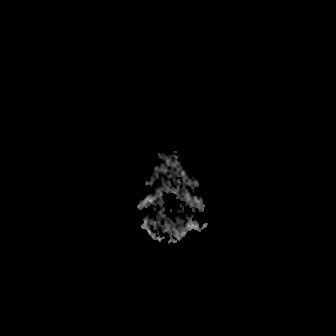

[Series 9: cor dwi_tracew · coronal · 5.0mm · 0.68mm/px · 3 of 40 slices shown]
[im 1/40]
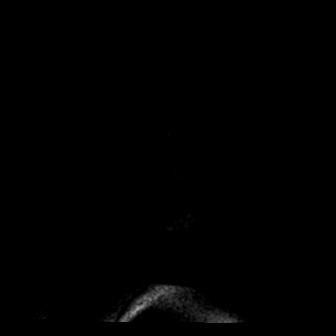
[im 20/40]
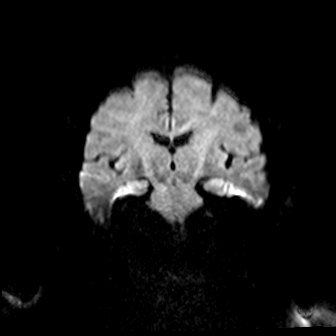
[im 40/40]
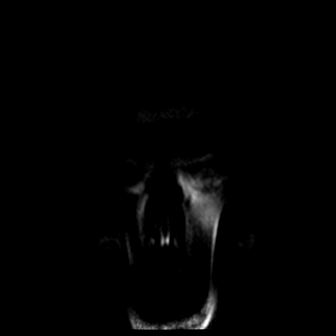

[Series 10: cor dwi_adc · coronal · 5.0mm · 0.68mm/px · 3 of 40 slices shown]
[im 1/40]
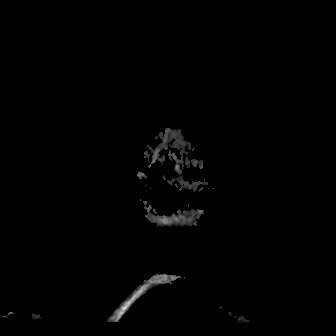
[im 20/40]
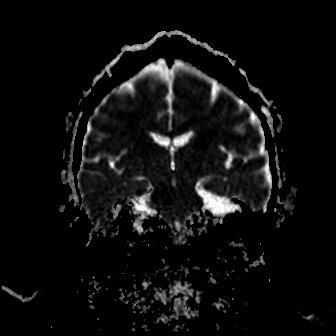
[im 40/40]
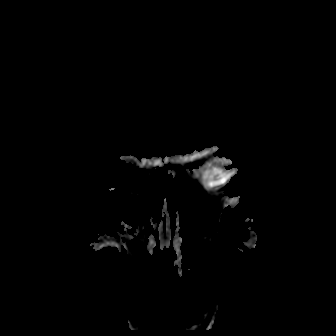

[Series 11: T1 · sagittal · 5.0mm · 0.47mm/px · 2 of 24 slices shown (1 of 2)]
[im 1/24]
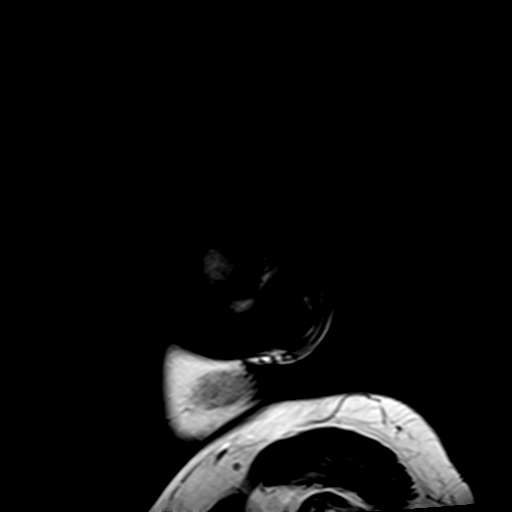
[im 24/24]
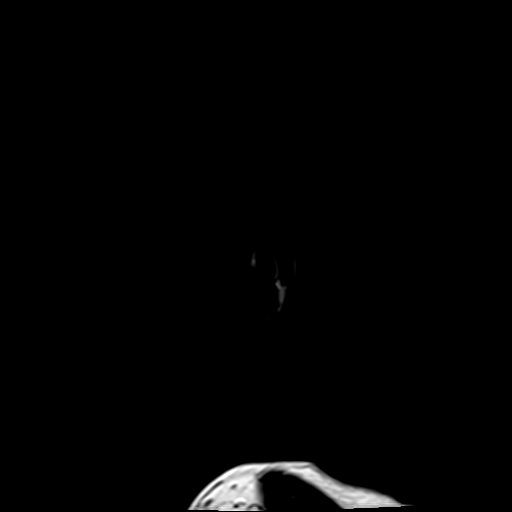

[Series 12: T2 · axial · 5.0mm · 0.86mm/px · z∈[-45,+113]mm · 2 of 28 slices shown (1 of 2)]
[im 1/28]
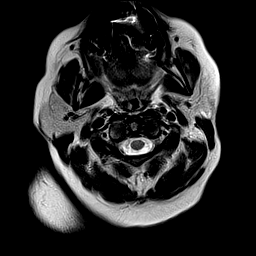
[im 28/28]
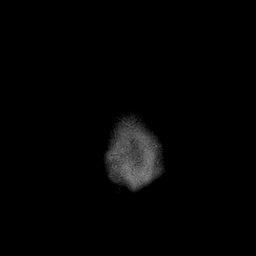

[Series 13: mag_images · axial · 3.0mm · 0.90mm/px · z∈[-39,+110]mm · 4 of 52 slices shown]
[im 1/52]
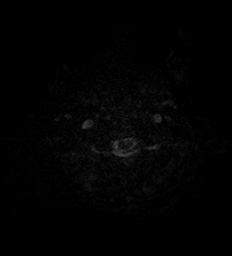
[im 18/52]
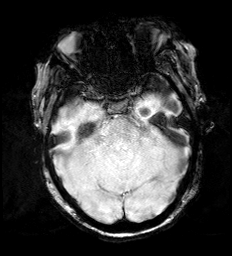
[im 35/52]
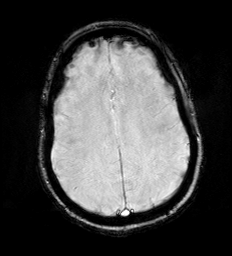
[im 52/52]
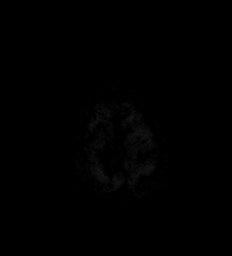

[Series 14: pha_images · axial · 3.0mm · 0.90mm/px · z∈[-39,+110]mm · 4 of 52 slices shown]
[im 1/52]
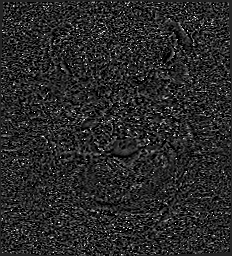
[im 18/52]
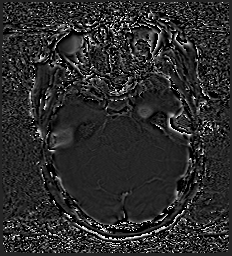
[im 35/52]
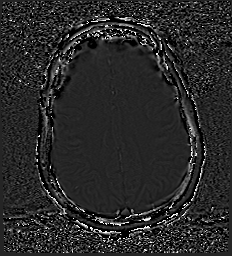
[im 52/52]
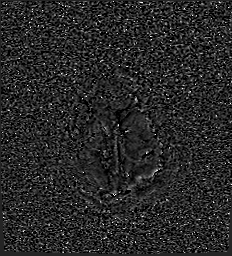

[Series 15: swi_images · axial · 3.0mm · 0.90mm/px · z∈[-39,+110]mm · 4 of 52 slices shown]
[im 1/52]
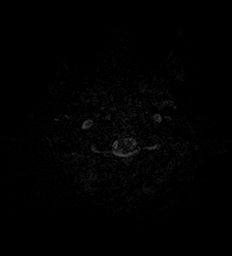
[im 18/52]
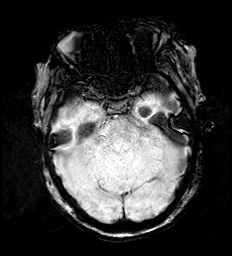
[im 35/52]
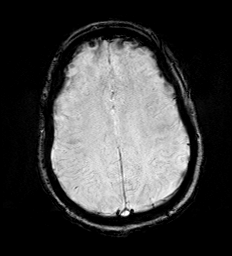
[im 52/52]
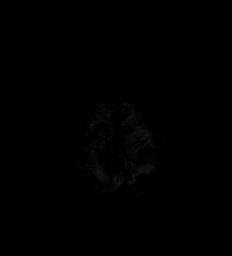

[Series 17: FLAIR · axial · 3.0mm · 0.69mm/px · z∈[-45,+113]mm · 4 of 55 slices shown]
[im 1/55]
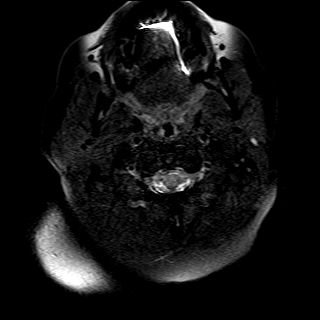
[im 19/55]
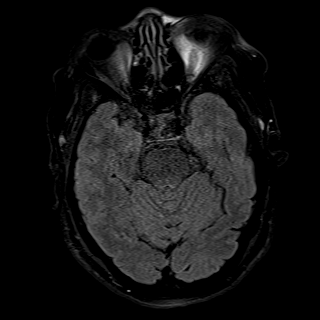
[im 37/55]
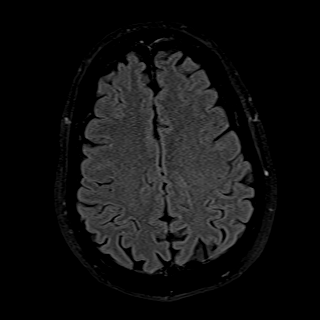
[im 55/55]
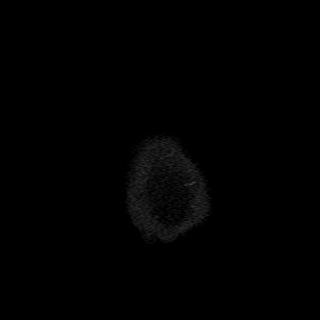

[Series 18: T1 · axial · 1.0mm · 0.98mm/px · z∈[-48,+123]mm · 13 of 176 slices shown (2 of 2)]
[im 1/176]
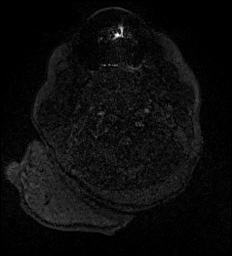
[im 15/176]
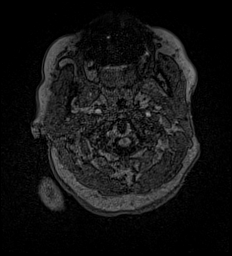
[im 30/176]
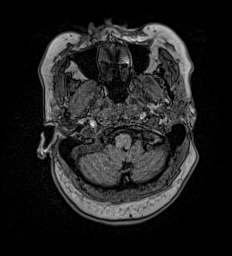
[im 44/176]
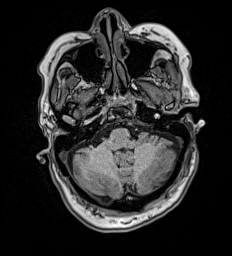
[im 59/176]
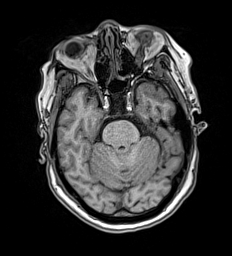
[im 73/176]
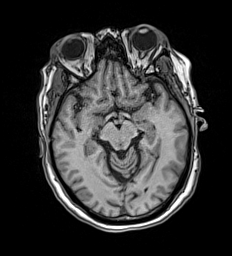
[im 88/176]
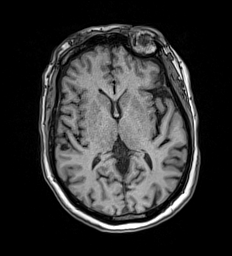
[im 103/176]
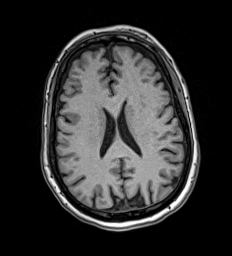
[im 117/176]
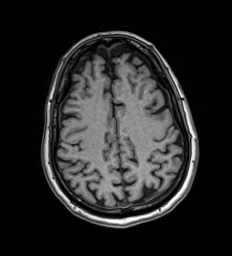
[im 132/176]
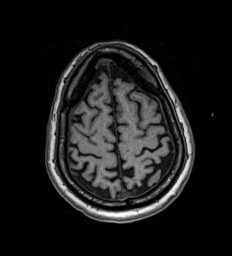
[im 146/176]
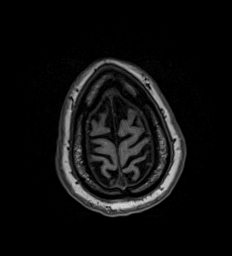
[im 161/176]
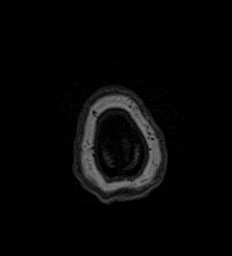
[im 176/176]
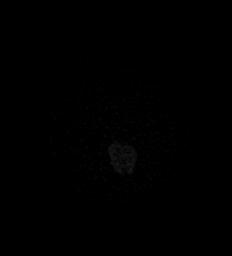

[Series 19: T2 · coronal · 5.0mm · 0.86mm/px · 2 of 32 slices shown (2 of 2)]
[im 1/32]
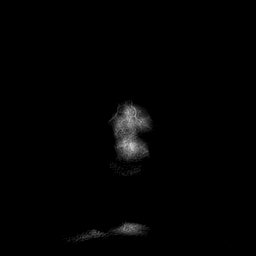
[im 32/32]
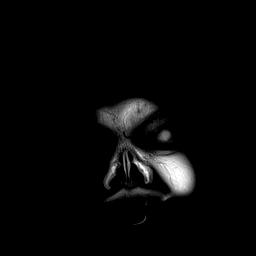

[48 of 48 positions shown; findings below may reference images not displayed]

FINDINGS: Brain: There is no evidence of an acute infarct, intracranial
hemorrhage, mass, midline shift, or extra-axial fluid collection. No
cerebral edema is seen to indicate global hypoxic-ischemic injury.
The ventricles and sulci are normal. There is a markedly enlarged
empty sella which is unchanged.

Vascular: Major intracranial vascular flow voids are preserved.

Skull and upper cervical spine: Unremarkable bone marrow signal.

Sinuses/Orbits: Unremarkable orbits. Paranasal sinuses and mastoid
air cells are clear.

Other: None.
IMPRESSION: 1. No acute intracranial abnormality.
2. Chronic empty sella.

## 2021-03-02 MED ORDER — LOSARTAN POTASSIUM 50 MG PO TABS
100.0000 mg | ORAL_TABLET | Freq: Every day | ORAL | Status: DC
  Administered 2021-03-02 – 2021-03-04 (×3): 100 mg via ORAL
  Filled 2021-03-02 (×4): qty 2

## 2021-03-02 MED ORDER — HYDRALAZINE HCL 20 MG/ML IJ SOLN
10.0000 mg | Freq: Four times a day (QID) | INTRAMUSCULAR | Status: DC | PRN
  Administered 2021-03-02: 10 mg via INTRAVENOUS

## 2021-03-02 MED ORDER — ENSURE ENLIVE PO LIQD: 237.0000 mL | Freq: Two times a day (BID) | ORAL | Status: DC

## 2021-03-02 MED ORDER — CLONAZEPAM 0.25 MG PO TBDP
0.2500 mg | ORAL_TABLET | Freq: Two times a day (BID) | ORAL | Status: DC | PRN
  Administered 2021-03-02: 0.25 mg via ORAL
  Filled 2021-03-02: qty 2

## 2021-03-02 MED ORDER — TRAZODONE HCL 50 MG PO TABS
25.0000 mg | ORAL_TABLET | Freq: Every evening | ORAL | Status: DC | PRN
  Administered 2021-03-02: 25 mg via ORAL
  Filled 2021-03-02 (×2): qty 1

## 2021-03-02 MED ORDER — HYDRALAZINE HCL 20 MG/ML IJ SOLN
INTRAMUSCULAR | Status: AC
  Filled 2021-03-02: qty 1

## 2021-03-02 MED ORDER — METOPROLOL SUCCINATE ER 50 MG PO TB24
50.0000 mg | ORAL_TABLET | Freq: Every day | ORAL | Status: DC
  Administered 2021-03-02 – 2021-03-04 (×3): 50 mg via ORAL
  Filled 2021-03-02 (×5): qty 1

## 2021-03-02 MED ORDER — HYDROCHLOROTHIAZIDE 12.5 MG PO CAPS
12.5000 mg | ORAL_CAPSULE | Freq: Every day | ORAL | Status: DC
  Administered 2021-03-02 – 2021-03-03 (×2): 12.5 mg via ORAL
  Filled 2021-03-02 (×3): qty 1

## 2021-03-02 MED ORDER — LOSARTAN POTASSIUM-HCTZ 100-12.5 MG PO TABS: 1.0000 | ORAL_TABLET | Freq: Every day | ORAL | Status: DC

## 2021-03-02 MED ORDER — CLONAZEPAM 0.125 MG PO TBDP: 0.2500 mg | ORAL_TABLET | Freq: Two times a day (BID) | ORAL | Status: DC | PRN

## 2021-03-02 MED ORDER — ACETAMINOPHEN 325 MG PO TABS
ORAL_TABLET | ORAL | Status: AC
  Filled 2021-03-02: qty 2

## 2021-03-02 MED ORDER — ACETAMINOPHEN 325 MG PO TABS
650.0000 mg | ORAL_TABLET | ORAL | Status: DC | PRN
  Administered 2021-03-02: 650 mg via ORAL

## 2021-03-02 MED ORDER — PERFLUTREN LIPID MICROSPHERE
1.0000 mL | INTRAVENOUS | Status: AC | PRN
  Administered 2021-03-02: 3 mL via INTRAVENOUS
  Filled 2021-03-02: qty 10

## 2021-03-02 NOTE — Evaluation (Addendum)
Clinical/Bedside Swallow Evaluation Patient Details  Name: Julie James MRN: [id number] Date of Birth: 03-Sep-1951  Today's Date: 03/02/2021 Time: SLP Start Time (ACUTE ONLY): 11 SLP Stop Time (ACUTE ONLY): 1020 SLP Time Calculation (min) (ACUTE ONLY): 60 min  Past Medical History:  Past Medical History:  Diagnosis Date  . Hypercholesteremia   . Hypertension    Past Surgical History: No past surgical history on file. HPI:  Pt is a 70 y.o. female here with cardiac arrest.  Patient reportedly was in her usual state of health today.  She was getting into her house when she reportedly fell backwards, and went unresponsive.  Husband states he found her and she was confused and then she became apneic.  He immediately began CPR.  911 was called and on fire/rescue arrival, patient was reportedly in a shockable rhythm.  She received 2 shocks.  On EMS arrival, patient had a pulse and was in what appeared to be a normal sinus rhythm.  She was activated as a code STEMI due to concern for anterior precordial lead elevations, and was sent to the ED.  She has been minimally responsive in route.  She arrived to the ED with pulses, unresponsive.  Code STEMI was activated however this was deactivated by Dr. Clayborn Bigness on STEMI call.  Patient was intubated in the emergency room due to being unresponsive.  Of note, the patient was seen at her primary provider's office on 22 February 2021 and was noted to have significant hypertension and bradycardia at that time.  CXR: Mild cardiomegaly. No focal airspace consolidation or pulmonary  edema. No pneumothorax. No pleural effusion.  MRI pending.   Assessment / Plan / Recommendation Clinical Impression  Pt appears to present w/ adequate oropharyngeal phase swallow function w/ No overt oropharyngeal phase dysphagia noted, No neuromuscular deficits noted. Pt consumed po trials w/ No overt, clinical s/s of aspiration during po trials. Pt appears at reduced risk for  aspiration when following general aspiration precautions. During po trials, pt consumed all consistencies w/ no overt coughing, decline in vocal quality, or change in respiratory presentation during/post trials. O2 sats remained 92-97%. Oral phase appeared [facility] w/ timely bolus management, mastication, and control of bolus propulsion for A-P transfer for swallowing. Oral clearing achieved w/ all trial consistencies. OM Exam appeared [facility] w/ no unilateral weakness noted. Oral cavity dry. Speech Clear. Pt fed self w/ setup support. Recommend a Regular consistency diet w/ well-Cut meats, moistened foods; Thin liquids - monitor straw use. Recommend general aspiration precautions, Pills WHOLE in Puree for safer, easier swallowing if any difficulty swallowing Pills w/ water. Education given on Pills in Puree; food consistencies and easy to eat options; general aspiration precautions. Of note, pt declined most po's stating she did not want anything - no appetite. MD made aware w/ recommendation for Dietician consult. NSG to reconsult if any new needs arise. NSG agreed  Addendum: pt was A/O x3 - unsure of events surrounding illness/admit but this could be expected. She was able to follow general instructions but noted min slower responses at times during follow through w/ tasks or questions - this was not consistent. Recommend monitoring pt's cognitive-linguistic status for any need for formal assessment and treatment if indicated. Again, could be the impact of the illness and medications (sedating) given d/t oral intubation immediately upon admit(just extubated on 03/01/2021).  SLP Visit Diagnosis: Dysphagia, unspecified (R13.10)    Aspiration Risk   (reduced following precautions)    Diet Recommendation  Regular diet w/  cut meats, moistened foods; Thin liquids. General aspiration precautions. Tray setup and support as needed at meals  Medication Administration: Whole meds with liquid (vs need for Whole in Puree for  ease of swallowing)    Other  Recommendations Recommended Consults:  (Dietician f/u) Oral Care Recommendations: Oral care BID;Oral care before and after PO;Staff/trained caregiver to provide oral care;Patient independent with oral care Other Recommendations:  (n/a)   Follow up Recommendations None      Frequency and Duration  (n/a)   (n/a)       Prognosis Prognosis for Safe Diet Advancement: Good Barriers to Reach Goals: Cognitive deficits (min) Barriers/Prognosis Comment: s/p illness      Swallow Study   General Date of Onset: 02/27/21 HPI: Pt is a 70 y.o. female here with cardiac arrest.  Patient reportedly was in her usual state of health today.  She was getting into her house when she reportedly fell backwards, and went unresponsive.  Husband states he found her and she was confused and then she became apneic.  He immediately began CPR.  911 was called and on fire/rescue arrival, patient was reportedly in a shockable rhythm.  She received 2 shocks.  On EMS arrival, patient had a pulse and was in what appeared to be a normal sinus rhythm.  She was activated as a code STEMI due to concern for anterior precordial lead elevations, and was sent to the ED.  She has been minimally responsive in route.  She arrived to the ED with pulses, unresponsive.  Code STEMI was activated however this was deactivated by Dr. Clayborn Bigness on STEMI call.  Patient was intubated in the emergency room due to being unresponsive.  Of note, the patient was seen at her primary provider's office on 22 February 2021 and was noted to have significant hypertension and bradycardia at that time.  CXR: Mild cardiomegaly. No focal airspace consolidation or pulmonary  edema. No pneumothorax. No pleural effusion.  MRI pending. Type of Study: Bedside Swallow Evaluation Diet Prior to this Study: NPO (regular diet at home) Temperature Spikes Noted: No (wbc 9.4) Respiratory Status: Nasal cannula (2L) History of Recent Intubation:  Yes Length of Intubations (days): 3 days Date extubated: 03/01/21 Behavior/Cognition: Alert;Cooperative;Pleasant mood;Distractible;Requires cueing Oral Cavity Assessment:  (sticky) Oral Care Completed by SLP: Yes Oral Cavity - Dentition: Adequate natural dentition (w/ partial) Vision: Functional for self-feeding Self-Feeding Abilities: Able to feed self;Needs assist;Needs set up Patient Positioning: Upright in bed (needed support) Baseline Vocal Quality: Normal Volitional Cough: Strong Volitional Swallow: Able to elicit    Oral/Motor/Sensory Function Overall Oral Motor/Sensory Function: Within functional limits   Ice Chips Ice chips: Within functional limits Presentation: Spoon (fed; 3 trials)   Thin Liquid Thin Liquid: Within functional limits Presentation: Cup;Self Fed;Straw (4 trials via each) Other Comments: declined further    Nectar Thick Nectar Thick Liquid: Not tested   Honey Thick Honey Thick Liquid: Not tested   Puree Puree: Within functional limits Presentation: Spoon;Self Fed (supported; 6 trials total) Other Comments: declined further   Solid     Solid: Within functional limits Presentation: Self Fed (3 bites) Other Comments: declined further        Orinda Kenner, MS, [employer] Speech Language Pathologist Rehab Services [phone] [facility] 03/02/2021,12:08 PM

## 2021-03-02 NOTE — Plan of Care (Signed)
  Problem: Education: Goal: Knowledge of General Education information will improve Description Including pain rating scale, medication(s)/side effects and non-pharmacologic comfort measures Outcome: Progressing   Problem: Health Behavior/Discharge Planning: Goal: Ability to manage health-related needs will improve Outcome: Progressing   Problem: Clinical Measurements: Goal: Ability to maintain clinical measurements within normal limits will improve Outcome: Progressing Goal: Will remain free from infection Outcome: Progressing Goal: Diagnostic test results will improve Outcome: Progressing Goal: Respiratory complications will improve Outcome: Progressing Goal: Cardiovascular complication will be avoided Outcome: Progressing   Problem: Activity: Goal: Risk for activity intolerance will decrease Outcome: Progressing   Problem: Nutrition: Goal: Adequate nutrition will be maintained Outcome: Progressing   Problem: Coping: Goal: Level of anxiety will decrease Outcome: Progressing   Problem: Elimination: Goal: Will not experience complications related to bowel motility Outcome: Progressing Goal: Will not experience complications related to urinary retention Outcome: Progressing   Problem: Pain Managment: Goal: General experience of comfort will improve Outcome: Progressing   Problem: Safety: Goal: Ability to remain free from injury will improve Outcome: Progressing   Problem: Skin Integrity: Goal: Risk for impaired skin integrity will decrease Outcome: Progressing   Problem: Education: Goal: Ability to manage disease process will improve Outcome: Progressing   Problem: Cardiac: Goal: Ability to achieve and maintain adequate cardiopulmonary perfusion will improve Outcome: Progressing   Problem: Neurologic: Goal: Promote progressive neurologic recovery Outcome: Progressing   Problem: Skin Integrity: Goal: Risk for impaired skin integrity will be  minimized. Outcome: Progressing   

## 2021-03-02 NOTE — Progress Notes (Signed)
Daily Progress Note   Patient Name: Julie James       Date: 03/02/2021 DOB: 1951/06/10  Age: 70 y.o. MRN#: [id number] Attending Physician: Max Sane, MD Primary Care Physician: Verl Bangs, FNP Admit Date: 02/27/2021  Reason for Consultation/Follow-up: Establishing goals of care  Subjective: Patient is sitting in bedside chair talking with staff this morning. She smiles when I enter. She is understandably confused about events surrounding this hospitalization. She is currently being moved to a wheelchair to go to MRI. Will continue to follow. Full code/full scope.   Length of Stay: 3  Current Medications: Scheduled Meds:  . chlorhexidine  15 mL Mouth Rinse BID  . Chlorhexidine Gluconate Cloth  6 each Topical Daily  . losartan  100 mg Oral Daily   And  . hydrochlorothiazide  12.5 mg Oral Daily  . mouth rinse  15 mL Mouth Rinse q12n4p  . metoprolol succinate  50 mg Oral Daily  . pantoprazole (PROTONIX) IV  40 mg Intravenous QHS  . sodium chloride flush  3 mL Intravenous Q12H    Continuous Infusions: . sodium chloride      PRN Meds: sodium chloride, acetaminophen, albuterol, docusate sodium, ondansetron (ZOFRAN) IV, polyethylene glycol, sodium chloride flush  Physical Exam Pulmonary:     Effort: Pulmonary effort is normal.  Neurological:     Mental Status: She is alert.             Vital Signs: BP (!) 178/61   Pulse (!) 105   Temp 97.8 F (36.6 C) (Oral)   Resp (!) 23   Ht 5' 5.98" (1.676 m)   Wt 120 kg   SpO2 94%   BMI 42.72 kg/m  SpO2: SpO2: 94 % O2 Device: O2 Device: Room Air O2 Flow Rate: O2 Flow Rate (L/min): 2 L/min  Intake/output summary:   Intake/Output Summary (Last 24 hours) at 03/02/2021 1126 Last data filed at 03/02/2021 0800 Gross per 24  hour  Intake 1230.14 ml  Output 1425 ml  Net -194.86 ml   LBM: Last BM Date:  (PTA) Baseline Weight: Weight: 118.1 kg Most recent weight: Weight: 120 kg        Patient Active Problem List   Diagnosis Date Noted  . Cardiac arrest ([place]) 02/27/2021  . GERD (gastroesophageal reflux disease) 08/23/2020  . Needs flu  shot 08/23/2020  . Breast cancer screening by mammogram 03/12/2020  . Screening for malignant neoplasm of colon 03/12/2020  . Drug-induced hypokalemia 08/07/2019  . Lipoma of abdomen 07/30/2017  . Lipoma of right thigh 07/30/2017  . Hypertension 06/23/2016  . Hyperlipidemia 06/23/2016  . Thyroid nodule 03/16/2014    Palliative Care Assessment & Plan   Recommendations/Plan: Full code/full scope.   Code Status: Code Status History    Date Active Date Inactive Code Status Order ID Comments User Context   02/28/2021 0159 03/01/2021 1106 Full Code [id number]  Rust-ChesterHuel Cote, NP Inpatient   02/27/2021 0051 02/28/2021 0159 Full Code [id number]  Tyler Pita, MD ED   02/27/2021 1625 02/27/2021 1634 Full Code [id number]  Tyler Pita, MD ED   08/07/2019 0931 08/08/2019 1822 Full Code [id number]  Lang Snow, NP ED   Advance Care Planning Activity    Questions for Most Recent Historical Code Status (Order [id number])       Prognosis:  Unable to determine   Care plan was discussed with RN  Thank you for allowing the Palliative Medicine Team to assist in the care of this patient.   Total Time 15 min Prolonged Time Billed no      Greater than 50%  of this time was spent counseling and coordinating care related to the above assessment and plan.  Asencion Gowda, NP  Please contact Palliative Medicine Team phone at [phone] for questions and concerns.

## 2021-03-02 NOTE — Progress Notes (Signed)
[facility] [place] NAME: Julie James    MR#:  [id number]  DATE OF BIRTH:  17-Oct-1951  SUBJECTIVE:  CHIEF COMPLAINT:   Chief Complaint  Patient presents with  . Cardiac Arrest  just returned from MRI, doesn't recall why she is here REVIEW OF SYSTEMS:  Review of Systems  Constitutional: Negative for diaphoresis, fever, malaise/fatigue and weight loss.  HENT: Negative for ear discharge, ear pain, hearing loss, nosebleeds, sore throat and tinnitus.   Eyes: Negative for blurred vision and pain.  Respiratory: Negative for cough, hemoptysis, shortness of breath and wheezing.   Cardiovascular: Negative for chest pain, palpitations, orthopnea and leg swelling.  Gastrointestinal: Negative for abdominal pain, blood in stool, constipation, diarrhea, heartburn, nausea and vomiting.  Genitourinary: Negative for dysuria, frequency and urgency.  Musculoskeletal: Negative for back pain and myalgias.  Skin: Negative for itching and rash.  Neurological: Negative for dizziness, tingling, tremors, focal weakness, seizures, weakness and headaches.  Psychiatric/Behavioral: Negative for depression. The patient is not nervous/anxious.    DRUG ALLERGIES:   Allergies  Allergen Reactions  . Chlorthalidone Other (See Comments)    hypokalemia   VITALS:  Blood pressure (!) 140/59, pulse 60, temperature 98.4 F (36.9 C), temperature source Oral, resp. rate 18, height 5' 5.98" (1.676 m), weight 120 kg, SpO2 100 %. PHYSICAL EXAMINATION:  Physical Exam  70 year old female lying in the bed comfortably not in acute distress Cardiovascular S1-S2 normal, no murmur rubs or gallop Lungs clear to auscultation bilaterally no wheezing rales rhonchi or crepitation.  Mild tenderness of the chest wall likely from chest compression Abdomen soft nontender nondistended bowel sounds present Extremities no pedal edema cyanosis or clubbing Neuro patient is alert and awake.  Nonfocal  exam LABORATORY PANEL:  Female CBC Recent Labs  Lab 03/02/21 0605  WBC 9.4  HGB 9.9*  HCT 30.8*  PLT 264   ------------------------------------------------------------------------------------------------------------------ Chemistries  Recent Labs  Lab 03/01/21 0204 03/02/21 0605  NA 133* 137  K 4.6 4.4  CL 105 106  CO2 23 26  GLUCOSE 127* 106*  BUN 13 11  CREATININE 0.74 0.74  CALCIUM 8.6* 9.0  MG 1.9  --   AST 65* 43*  ALT 110* 81*  ALKPHOS 64 66  BILITOT 0.6 0.8   RADIOLOGY:  MR BRAIN WO CONTRAST  Result Date: 03/02/2021 CLINICAL DATA:  Anoxic brain damage.  Status post cardiac arrest. EXAM: MRI HEAD WITHOUT CONTRAST TECHNIQUE: Multiplanar, multiecho pulse sequences of the brain and surrounding structures were obtained without intravenous contrast. COMPARISON:  Head CT 08/07/2019 FINDINGS: Brain: There is no evidence of an acute infarct, intracranial hemorrhage, mass, midline shift, or extra-axial fluid collection. No cerebral edema is seen to indicate global hypoxic-ischemic injury. The ventricles and sulci are normal. There is a markedly enlarged empty sella which is unchanged. Vascular: Major intracranial vascular flow voids are preserved. Skull and upper cervical spine: Unremarkable bone marrow signal. Sinuses/Orbits: Unremarkable orbits. Paranasal sinuses and mastoid air cells are clear. Other: None. IMPRESSION: 1. No acute intracranial abnormality. 2. Chronic empty sella. Electronically Signed   By: Logan Bores M.D.   On: 03/02/2021 14:00   ECHOCARDIOGRAM COMPLETE  Result Date: 03/02/2021    ECHOCARDIOGRAM REPORT   Patient Name:   Julie James Date of Exam: 03/02/2021 Medical Rec #:  [id number]          Height:       66.0 in Accession #:    [id number]  Weight:       264.6 lb Date of Birth:  [birth year]          BSA:          2.252 m Patient Age:    70 years           BP:           164/66 mmHg Patient Gender: F                  HR:           85 bpm. Exam  Location:  [facility] Procedure: 2D Echo, Color Doppler, Cardiac Doppler and Intracardiac            Opacification Agent Indications:     I46.9 Cardiac arrest  History:         Patient has no prior history of Echocardiogram examinations.                  Risk Factors:Hypertension and HCL.  Sonographer:     Charmayne Sheer RDCS (AE) Referring Phys:  [id number] Tyler Pita Diagnosing Phys: Kathlyn Sacramento MD  Sonographer Comments: Suboptimal apical window and suboptimal subcostal window. Image acquisition challenging due to patient body habitus. IMPRESSIONS  1. Left ventricular ejection fraction, by estimation, is 55 to 60%. The left ventricle has normal function. The left ventricle has no regional wall motion abnormalities. There is mild left ventricular hypertrophy. Left ventricular diastolic parameters are consistent with Grade I diastolic dysfunction (impaired relaxation).  2. Right ventricular systolic function is normal. The right ventricular size is normal. Tricuspid regurgitation signal is inadequate for assessing PA pressure.  3. The mitral valve is normal in structure. No evidence of mitral valve regurgitation. No evidence of mitral stenosis.  4. The aortic valve is normal in structure. Aortic valve regurgitation is not visualized. Mild aortic valve sclerosis is present, with no evidence of aortic valve stenosis.  5. The inferior vena cava is normal in size with greater than 50% respiratory variability, suggesting right atrial pressure of 3 mmHg.  6. Challenging image quality. FINDINGS  Left Ventricle: Left ventricular ejection fraction, by estimation, is 55 to 60%. The left ventricle has normal function. The left ventricle has no regional wall motion abnormalities. Definity contrast agent was given IV to delineate the left ventricular  endocardial borders. The left ventricular internal cavity size was normal in size. There is mild left ventricular hypertrophy. Left ventricular diastolic parameters are consistent with  Grade I diastolic dysfunction (impaired relaxation). Right Ventricle: The right ventricular size is normal. No increase in right ventricular wall thickness. Right ventricular systolic function is normal. Tricuspid regurgitation signal is inadequate for assessing PA pressure. Left Atrium: Left atrial size was normal in size. Right Atrium: Right atrial size was normal in size. Pericardium: There is no evidence of pericardial effusion. Mitral Valve: The mitral valve is normal in structure. No evidence of mitral valve regurgitation. No evidence of mitral valve stenosis. MV peak gradient, 13.5 mmHg. The mean mitral valve gradient is 6.0 mmHg. Tricuspid Valve: The tricuspid valve is normal in structure. Tricuspid valve regurgitation is trivial. No evidence of tricuspid stenosis. Aortic Valve: The aortic valve is normal in structure. Aortic valve regurgitation is not visualized. Mild aortic valve sclerosis is present, with no evidence of aortic valve stenosis. Aortic valve mean gradient measures 9.0 mmHg. Aortic valve peak gradient measures 15.7 mmHg. Aortic valve area, by VTI measures 2.50 cm. Pulmonic Valve: The pulmonic valve was normal in structure. Pulmonic  valve regurgitation is not visualized. No evidence of pulmonic stenosis. Aorta: The aortic root is normal in size and structure. Venous: The inferior vena cava is normal in size with greater than 50% respiratory variability, suggesting right atrial pressure of 3 mmHg. IAS/Shunts: No atrial level shunt detected by color flow Doppler.  LEFT VENTRICLE PLAX 2D LVIDd:         4.50 cm  Diastology LVIDs:         3.10 cm  LV e' medial:    8.38 cm/s LV PW:         1.10 cm  LV E/e' medial:  11.3 LV IVS:        1.10 cm  LV e' lateral:   8.27 cm/s LVOT diam:     2.20 cm  LV E/e' lateral: 11.4 LV SV:         93 LV SV Index:   41 LVOT Area:     3.80 cm  LEFT ATRIUM         Index LA diam:    3.40 cm 1.51 cm/m  AORTIC VALVE                    PULMONIC VALVE AV Area (Vmax):     2.42 cm     PV Vmax:       1.49 m/s AV Area (Vmean):   2.38 cm     PV Vmean:      103.000 cm/s AV Area (VTI):     2.50 cm     PV VTI:        0.301 m AV Vmax:           198.00 cm/s  PV Peak grad:  8.9 mmHg AV Vmean:          138.000 cm/s PV Mean grad:  5.0 mmHg AV VTI:            0.373 m AV Peak Grad:      15.7 mmHg AV Mean Grad:      9.0 mmHg LVOT Vmax:         126.00 cm/s LVOT Vmean:        86.500 cm/s LVOT VTI:          0.245 m LVOT/AV VTI ratio: 0.66  AORTA Ao Root diam: 3.00 cm MITRAL VALVE MV Area (PHT): 5.16 cm     SHUNTS MV Area VTI:   3.58 cm     Systemic VTI:  0.24 m MV Peak grad:  13.5 mmHg    Systemic Diam: 2.20 cm MV Mean grad:  6.0 mmHg MV Vmax:       1.84 m/s MV Vmean:      110.0 cm/s MV Decel Time: 147 msec MV E velocity: 94.30 cm/s MV A velocity: 169.00 cm/s MV E/A ratio:  0.56 Kathlyn Sacramento MD Electronically signed by Kathlyn Sacramento MD Signature Date/Time: 03/02/2021/2:08:02 PM    Final    ASSESSMENT AND PLAN:  70 year old female brought in for cardiac arrest.  Initially she was activated as a code STEMI but cardiology reviewed and deactivated code STEMI.  Patient was intubated in the emergency room for being unresponsive.  3/27 admitted by ICU teamEndotracheal intubation and mechanical ventilation, Hypothermia protocol 3/28 severe resp failure, hypothermia protocol, family updated 3/29 severe respf ailure, cardiac failure rewarming phase, extubated 3/30 [facility] assumed care.  Transfer to PCU, MRI brain negative  Acute respiratory failure present on admission Requiring intubation on 3/27 and was extubated on 3/29 -Now  remains on room air  Cardiac arrest out-of-hospital Required hypothermia protocol while in ICU Per record patient was at her PCPs office on 22 February 2021 when she was noted to have significant hypotension and bradycardia with a heart rate noted to be 39.  She was requested to reduce her beta-blocker for 25 mg once daily and was added losartan/HCTZ 100/12.5 mg that  time. Echo shows normal LV systolic function Per cardiology note on 3/28 -> no permanent pacemaker or cardiac catheterization need There is no follow-up from cardiology on 3/29, 5/03  Acute metabolic encephalopathy Negative MRI of the brain Likely from hypoxic brain injury with cardiac arrest She seemed to be clearing up her mental status and seems close to her baseline now  AKI Present on admission and now resolved with hydration, likely prerenal  Obesity Body mass index is 42.72 kg/m.  Net IO Since Admission: 2,507.16 mL [03/02/21 2216]      Status is: Inpatient  Remains inpatient appropriate because:Inpatient level of care appropriate due to severity of illness   Dispo: The patient is from: Home              Anticipated d/c is to: Home              Patient currently is not medically stable to d/c.   Difficult to place patient No  Can transfer her to PCU if bed available   DVT prophylaxis:       Place and maintain sequential compression device Start: 03/01/21 1122     Family Communication:  "discussed with patient"   All the records are reviewed and case discussed with Care Management/Social Worker. Management plans discussed with the patient, nursing and they are in agreement.  CODE STATUS: Prior Level of care: Progressive Cardiac  TOTAL TIME TAKING CARE OF THIS PATIENT: 35 minutes.   More than 50% of the time was spent in counseling/coordination of care: YES  POSSIBLE D/C IN 2-3 DAYS, DEPENDING ON CLINICAL CONDITION.   Max Sane M.D on 03/02/2021 at 10:16 PM  [facility]   CC: Primary care physician; Verl Bangs, FNP  Note: This dictation was prepared with Dragon dictation along with smaller phrase technology. Any transcriptional errors that result from this process are unintentional.

## 2021-03-02 NOTE — Evaluation (Signed)
Occupational Therapy Evaluation Patient Details Name: Julie James MRN: [id number] DOB: [birth year] Today's Date: 03/02/2021    History of Present Illness Patient is 70 y/o F who presented to ED after being found down by husband who initiated CPR. Pt unresponsive and intubated upon arrival. Pt did have an episode of wide-complex tachycardia preventing CT initially and was started on amiodarone and t/f to ICU. Code STEMI was called, but deactivated by Dr. Clayborn Bigness. Pt extub on 03/01/2021 and pt being treated acutely for ischemic cardiomyopathy with severe acute hypoxic resp failure and multiorgan failure. Pt on room air at time of therapy assessment.   Clinical Impression   Pt seen for OT evaluation this date in setting of acute hospitalization following cardiac arrest. Pt presents this date with decreased fxl activity tolerance, balance and strength to safely perform ADLs/ADL mobility I'ly. Pt reports being INDEP at baseline including driving and running errands. States she lives with her daughter in 2 story home with 2 STE with L railing. On assessment, pt requires SETUP for seated UB ADLs, MIN/MOD A for seated LB ADLs (d/t decreased dynamic sitting balance) and requires CGA to MIN A for ADL transfers with hand-held assistance. Pt will benefit from skilled OT while in acute setting to increase functional strength and safety as it pertains to ADLs. Will continue to follow and recommend [place] f/u.     Follow Up Recommendations  Home health OT;Supervision - Intermittent    Equipment Recommendations  3 in 1 bedside commode;Tub/shower seat;Other (comment) (2ww)    Recommendations for Other Services       Precautions / Restrictions Precautions Precautions: Fall Restrictions Weight Bearing Restrictions: No      Mobility Bed Mobility Overal bed mobility: Modified Independent             General bed mobility comments: HOB elevated, increased time, use of bed rails, cues for hand  placement.    Transfers Overall transfer level: Needs assistance Equipment used: 1 person hand held assist Transfers: Sit to/from [facility] Sit to Stand: Min guard Stand pivot transfers: Min guard;Min assist       General transfer comment: increased assist to pivot d/t decreased balance for more dynamic movement.    Balance Overall balance assessment: Needs assistance   Sitting balance-Leahy Scale: Good       Standing balance-Leahy Scale: Fair Standing balance comment: benefits from UE support                           ADL either performed or assessed with clinical judgement   ADL Overall ADL's : Needs assistance/impaired                                       General ADL Comments: Pt requires SETUP for seated UB ADLs as well as intermittent cues to sequence. Pt requires MIN/MOD A for seated LB ADLs. CGA/MIN A for ADL transfers with HHA.     Vision Patient Visual Report: No change from baseline       Perception     Praxis      Pertinent Vitals/Pain Pain Assessment: No/denies pain     Hand Dominance     Extremity/Trunk Assessment Upper Extremity Assessment Upper Extremity Assessment: Overall WFL for tasks assessed;Generalized weakness (ROM WFL, MMT grossly 4/5)   Lower Extremity Assessment Lower Extremity Assessment: Overall WFL for tasks assessed;Generalized weakness  Communication Communication Communication: No difficulties   Cognition Arousal/Alertness: Awake/alert Behavior During Therapy: WFL for tasks assessed/performed;Impulsive Overall Cognitive Status: No family/caregiver present to determine baseline cognitive functioning                                 General Comments: Pt is mostly appropriate, some decreased insight into deficits, but overall able to follow commands and safety cueing. Pt with some delayed processing and some general decreased awareness in setting of s/p  extubation yesterday. Pt is orientd to place, self and some aspects of situation (asks: "did I have a heart attack?")   General Comments       Exercises Other Exercises Other Exercises: OT facilitates ed re: role of OT in acute setting, importance of OOB activity, safety with ADL transfers/mobility including use of call light while in acute setting d/t decreased balnace. Pt with good reception, but only moderate carryover d/t some lingering confusion.   Shoulder Instructions      Home Living Family/patient expects to be discharged to:: Private residence Living Arrangements: Children (daughter) Available Help at Discharge: Family;Available PRN/intermittently Type of Home: House Home Access: Stairs to enter [employer] of Steps: 2 Entrance Stairs-Rails: Left Home Layout: Two level;Bed/bath upstairs;1/2 bath on main level Alternate Level Stairs-Number of Steps: flight Alternate Level Stairs-Rails: Can reach both           Home Equipment: None          Prior Functioning/Environment Level of Independence: Independent        Comments: Pt is somewhat questionable historian d/t some general confusion, but she is oriented to self, place and some aspects of situation. 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 States she uses no devices for fxl mobility or self care.        OT Problem List: Decreased strength;Impaired balance (sitting and/or standing);Decreased range of motion;Decreased activity tolerance;Decreased safety awareness;Decreased knowledge of use of DME or AE;Decreased cognition;Decreased knowledge of precautions;Cardiopulmonary status limiting activity;Obesity      OT Treatment/Interventions: Self-care/ADL training;DME and/or AE instruction;Therapeutic activities;Therapeutic exercise;Patient/family education    OT Goals(Current goals can be found in the care plan section) Acute Rehab  OT Goals Patient Stated Goal: to go home OT Goal Formulation: With patient Time For Goal Achievement: 03/16/21 Potential to Achieve Goals: Good ADL Goals Pt Will Perform Lower Body Dressing: with supervision;sitting/lateral leans Pt Will Transfer to Toilet: with supervision;ambulating;bedside commode (with LRAD, ~10-15') Pt Will Perform Toileting - Clothing Manipulation and hygiene: with supervision;sit to/from stand Pt/caregiver will Perform Home Exercise Program: Increased strength;Both right and left upper extremity;With Supervision  OT Frequency: Min 1X/week   Barriers to D/C:            Co-evaluation              AM-PAC OT "6 Clicks" Daily Activity     Outcome Measure Help from another person eating meals?: None Help from another person taking care of personal grooming?: A Little Help from another person toileting, which includes using toliet, bedpan, or urinal?: A Little Help from another person bathing (including washing, rinsing, drying)?: A Little Help from another person to put on and taking off regular upper body clothing?: A Little Help from another person to put on and taking off regular lower body clothing?: A Little 6 Click Score: 19   End  of Session Equipment Utilized During Treatment: Gait belt;Rolling walker Nurse Communication: Mobility status;Other (comment) (notfieid that pt had episode of voiding into bedside commde.)  Activity Tolerance: Patient tolerated treatment well Patient left: in chair;with call bell/phone within reach  OT Visit Diagnosis: Unsteadiness on feet (R26.81);Muscle weakness (generalized) (M62.81)                Time: 7639-4320 OT Time Calculation (min): 31 min Charges:  OT General Charges $OT Visit: 1 Visit OT Evaluation $OT Eval Moderate Complexity: 1 Mod OT Treatments $Self Care/Home Management : 8-22 mins $Therapeutic Activity: 8-22 mins  Gerrianne Scale, MS, OTR/L ascom [phone] 03/02/21, 2:14 PM

## 2021-03-02 NOTE — Progress Notes (Signed)
Patient is able to converse, slow to respond, but appropriate. She is oriented to herself, the hospital, and the year. Disoriented to situation. When prompted, she is able to follow all commands with equal strength on both sides, upper and lower extremities.   Notified Sharion Settler, NP of pt's urine output right under 1.5 L. Pt has been fluid positive the last few days though. Continue to monitor, strict I&Os.

## 2021-03-02 NOTE — Progress Notes (Signed)
*  PRELIMINARY RESULTS* Echocardiogram 2D Echocardiogram has been performed.  Wallie Char Aara Jacquot 03/02/2021, 10:06 AM

## 2021-03-02 NOTE — Evaluation (Signed)
Physical Therapy Evaluation Patient Details Name: Julie James MRN: [id number] DOB: January 28, 1951 Today's Date: 03/02/2021   History of Present Illness  Patient is 70 y/o F who presented to ED after being found down by husband who initiated CPR. Pt unresponsive and intubated upon arrival. Pt did have an episode of wide-complex tachycardia preventing CT initially and was started on amiodarone and t/f to ICU. Code STEMI was called, but deactivated by Dr. Clayborn Bigness. Pt extub on 03/01/2021 and pt being treated acutely for ischemic cardiomyopathy with severe acute hypoxic resp failure and multiorgan failure. Pt on room air at time of therapy assessment.    Clinical Impression  Pt alert, agreeable to PT with encouragement. Pt reported at baseline she is independent, lives with family. The patient demonstrated bed mobility modI, and good sitting balance. Sit <> Stand with RW and CGA for steadying, and pt able to ambulate ~64ft with RW and CGA. Pt declined further mobility due to wanting to go to sleep, and did complain of a headache as well (RN notified). The patient demonstrated mild deficits in mobility, endurance, and balance, and would benefit from further skilled PT intervention to maximize and return pt to PLOF.     Follow Up Recommendations Outpatient PT    Equipment Recommendations  Rolling walker with 5" wheels    Recommendations for Other Services       Precautions / Restrictions Precautions Precautions: Fall Restrictions Weight Bearing Restrictions: No      Mobility  Bed Mobility Overal bed mobility: Modified Independent             General bed mobility comments: HOB elevated, increased time, use of bed rails, cues for hand placement.    Transfers Overall transfer level: Needs assistance Equipment used: Rolling walker (2 wheeled) Transfers: Sit to/from Stand Sit to Stand: Min guard Stand pivot transfers: Min guard;Min assist       General transfer comment:  increased assist to pivot d/t decreased balance for more dynamic movement.  Ambulation/Gait Ambulation/Gait assistance: Min guard Gait Distance (Feet): 20 Feet Assistive device: Rolling walker (2 wheeled)       General Gait Details: Pt able to ambulate with RW and CGA. Increased confidence and steadiness noted with AD  Stairs            Wheelchair Mobility    Modified Rankin (Stroke Patients Only)       Balance Overall balance assessment: Needs assistance   Sitting balance-Leahy Scale: Good       Standing balance-Leahy Scale: Fair Standing balance comment: benefits from UE support                             Pertinent Vitals/Pain Pain Assessment: Faces Pain Location: headache, a little bit of pain Pain Descriptors / Indicators: Aching Pain Intervention(s): Limited activity within patient's tolerance;Monitored during session;Repositioned    Home Living Family/patient expects to be discharged to:: Private residence Living Arrangements: Children (daughter) Available Help at Discharge: Family;Available PRN/intermittently Type of Home: House Home Access: Stairs to enter Entrance Stairs-Rails: Left Entrance Stairs-Number of Steps: 2 Home Layout: Two level;Bed/bath upstairs;1/2 bath on main level Home Equipment: None      Prior Function Level of Independence: Independent         Comments: Pt is somewhat questionable historian d/t some general confusion, but she is oriented to self, place and some aspects of situation. 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 States she uses no devices for fxl mobility or self care.     Hand Dominance        Extremity/Trunk Assessment   Upper Extremity Assessment Upper Extremity Assessment: Defer to OT evaluation    Lower Extremity Assessment Lower Extremity Assessment: Overall WFL for tasks assessed       Communication    Communication: No difficulties  Cognition Arousal/Alertness: Awake/alert Behavior During Therapy: WFL for tasks assessed/performed;Impulsive Overall Cognitive Status: No family/caregiver present to determine baseline cognitive functioning                                 General Comments: Pt is mostly appropriate, some decreased insight into deficits, but overall able to follow commands and safety cueing. Pt with some delayed processing and some general decreased awareness in setting of s/p extubation yesterday. Pt is orientd to place, self, some of time (knew the month/year) and some aspects of situation (asks: "did I have a heart attack?")      General Comments      Exercises Other Exercises Other Exercises: OT facilitates ed re: role of OT in acute setting, importance of OOB activity, safety with ADL transfers/mobility including use of call light while in acute setting d/t decreased balnace. Pt with good reception, but only moderate carryover d/t some lingering confusion.   Assessment/Plan    PT Assessment Patient needs continued PT services  PT Problem List Decreased strength;Decreased activity tolerance;Decreased balance;Decreased mobility       PT Treatment Interventions DME instruction;Balance training;Gait training;Neuromuscular re-education;Stair training;Functional mobility training;Patient/family education;Therapeutic activities;Therapeutic exercise    PT Goals (Current goals can be found in the Care Plan section)  Acute Rehab PT Goals Patient Stated Goal: to take a nap PT Goal Formulation: With patient Time For Goal Achievement: 03/16/21 Potential to Achieve Goals: Good    Frequency Min 2X/week   Barriers to discharge        Co-evaluation               AM-PAC PT "6 Clicks" Mobility  Outcome Measure Help needed turning from your back to your side while in a flat bed without using bedrails?: None Help needed moving from lying on your back to  sitting on the side of a flat bed without using bedrails?: None Help needed moving to and from a bed to a chair (including a wheelchair)?: None Help needed standing up from a chair using your arms (e.g., wheelchair or bedside chair)?: None Help needed to walk in hospital room?: A Little Help needed climbing 3-5 steps with a railing? : A Little 6 Click Score: 22    End of Session Equipment Utilized During Treatment: Gait belt Activity Tolerance: Patient tolerated treatment well Patient left: in bed;with call bell/phone within reach;with bed alarm set Nurse Communication: Mobility status PT Visit Diagnosis: Other abnormalities of gait and mobility (R26.89);Other symptoms and signs involving the nervous system (R29.898)    Time: 1610-9604 PT Time Calculation (min) (ACUTE ONLY): 11 min   Charges:   PT Evaluation $PT Eval Low Complexity: 1 Low        Lieutenant Diego PT, DPT 4:11 PM,03/02/21

## 2021-03-03 ENCOUNTER — Encounter: Payer: Self-pay | Admitting: Internal Medicine

## 2021-03-03 DIAGNOSIS — I469 Cardiac arrest, cause unspecified: Secondary | ICD-10-CM | POA: Diagnosis not present

## 2021-03-03 DIAGNOSIS — E876 Hypokalemia: Secondary | ICD-10-CM

## 2021-03-03 LAB — COMPREHENSIVE METABOLIC PANEL
ALT: 59 U/L — ABNORMAL HIGH (ref 0–44)
AST: 35 U/L (ref 15–41)
Albumin: 3.6 g/dL (ref 3.5–5.0)
Alkaline Phosphatase: 66 U/L (ref 38–126)
Anion gap: 8 (ref 5–15)
BUN: 13 mg/dL (ref 8–23)
CO2: 28 mmol/L (ref 22–32)
Calcium: 9.5 mg/dL (ref 8.9–10.3)
Chloride: 104 mmol/L (ref 98–111)
Creatinine, Ser: 0.84 mg/dL (ref 0.44–1.00)
GFR, Estimated: >60 mL/min (ref >60)
Glucose, Bld: 105 mg/dL — ABNORMAL HIGH (ref 70–99)
Potassium: 4.1 mmol/L (ref 3.5–5.1)
Sodium: 140 mmol/L (ref 135–145)
Total Bilirubin: 0.8 mg/dL (ref 0.3–1.2)
Total Protein: 7.1 g/dL (ref 6.5–8.1)

## 2021-03-03 LAB — CBC WITH DIFFERENTIAL/PLATELET
Abs Immature Granulocytes: 0.07 K/uL (ref 0.00–0.07)
Basophils Absolute: 0.1 K/uL (ref 0.0–0.1)
Basophils Relative: 1 %
Eosinophils Absolute: 0 K/uL (ref 0.0–0.5)
Eosinophils Relative: 0 %
HCT: 34.3 % — ABNORMAL LOW (ref 36.0–46.0)
Hemoglobin: 11.3 g/dL — ABNORMAL LOW (ref 12.0–15.0)
Immature Granulocytes: 1 %
Lymphocytes Relative: 18 %
Lymphs Abs: 1.5 K/uL (ref 0.7–4.0)
MCH: 29 pg (ref 26.0–34.0)
MCHC: 32.9 g/dL (ref 30.0–36.0)
MCV: 88.2 fL (ref 80.0–100.0)
Monocytes Absolute: 0.6 K/uL (ref 0.1–1.0)
Monocytes Relative: 8 %
Neutro Abs: 5.8 K/uL (ref 1.7–7.7)
Neutrophils Relative %: 72 %
Platelets: 301 K/uL (ref 150–400)
RBC: 3.89 MIL/uL (ref 3.87–5.11)
RDW: 14.4 % (ref 11.5–15.5)
WBC: 8 K/uL (ref 4.0–10.5)
nRBC: 0 % (ref 0.0–0.2)

## 2021-03-03 NOTE — Consult Note (Addendum)
      ELECTROPHYSIOLOGY CONSULT NOTE  Patient ID: Julie James, MRN: [id number], DOB/AGE: 01-01-1951 70 y.o. Admit date: 02/27/2021 Date of Consult: 03/03/2021  Primary Physician: Verl Bangs, FNP Full note to follow  Assessment and Plan:  Cardiac arrest with shock advised by the [facility]  Left bundle branch block  Normal LV function  Hypertension  Hypokalemia    The patient had a cardiac arrest as evidenced by the shock advisor from the [facility].  Troponins were borderline elevated which could have been from the ED and or chest compressions but still coronary disease needs to be excluded.  And would recommend catheterization  In the event that this is unilluminating, and this would mean high-grade disease which would be sufficient to provoke ischemia, would undertake cMRI to look for other substrate for cardiac arrest.  In the event that there is not a discernible trigger she would be appropriately considered for an ICD for secondary prevention.  In this regard, the low potassium on arrival is not likely related to the cardiac arrest.  Her potassium 5 days previously was 4.1.  The potassium deficit anticipated with potassium of less than 3 would have been much more than what she was repleted.  Suspect the low potassium was a consequence of the cardiac arrest as is frequently seen  When I discussed this with her this morning she was disinclined to pursue anything.  She did mention that she has lost a daughter, I thought a year ago but may have misunderstood it was a few years ago, but in speaking with her other daughter which I did at her request, there is some concern that there has been a general loss of enthusiasm about life.  The family will review together, daughters and husband and the patient as to how they would like to proceed.  For now, she is agreeable that we make her n.p.o. after midnight with the plan that we would undertake catheterization tomorrow if she is agreeable to  pursuing this course.  I further advised her daughter that she will be precluded from driving for 6 months [place]

## 2021-03-03 NOTE — Plan of Care (Signed)
  Problem: Clinical Measurements: Goal: Will remain free from infection Outcome: Progressing Goal: Respiratory complications will improve Outcome: Progressing   Problem: Activity: Goal: Risk for activity intolerance will decrease Outcome: Progressing   

## 2021-03-03 NOTE — Care Management Important Message (Signed)
Important Message  Patient Details  Name: Julie James MRN: [id number] Date of Birth: [birth year]   Medicare Important Message Given:  Yes     Dannette Barbara 03/03/2021, 1:14 PM

## 2021-03-03 NOTE — Progress Notes (Signed)
Daily Progress Note   Patient Name: Julie James       Date: 03/03/2021 DOB: 07-04-1951  Age: 70 y.o. MRN#: [id number] Attending Physician: Jennye Boroughs, MD Primary Care Physician: Verl Bangs, FNP Admit Date: 02/27/2021  Reason for Consultation/Follow-up: Establishing goals of care  Subjective: Patient is resting in bed. She denies complaint. She discusses her granddaughter who she is raising as one of her daughters is deceased. She is a woman of faith. We discussed the events surrounding this hospitalization and diagnosis.Work up  still in progress. She would like to continue full code/full scope as she wants to continue raising her granddaughter.  Length of Stay: 4  Current Medications: Scheduled Meds:  . chlorhexidine  15 mL Mouth Rinse BID  . feeding supplement  237 mL Oral BID BM  . losartan  100 mg Oral Daily   And  . hydrochlorothiazide  12.5 mg Oral Daily  . mouth rinse  15 mL Mouth Rinse q12n4p  . metoprolol succinate  50 mg Oral Daily  . pantoprazole (PROTONIX) IV  40 mg Intravenous QHS  . sodium chloride flush  3 mL Intravenous Q12H    Continuous Infusions: . sodium chloride      PRN Meds: sodium chloride, acetaminophen, albuterol, clonazepam, docusate sodium, hydrALAZINE, ondansetron (ZOFRAN) IV, polyethylene glycol, sodium chloride flush, traZODone  Physical Exam Pulmonary:     Effort: Pulmonary effort is normal.  Neurological:     Mental Status: She is alert.             Vital Signs: BP (!) 167/75 (BP Location: Left Arm)   Pulse 70   Temp 98.7 F (37.1 C) (Oral)   Resp 16   Ht 5' 5.98" (1.676 m)   Wt 112 kg   SpO2 93%   BMI 39.89 kg/m  SpO2: SpO2: 93 % O2 Device: O2 Device: Room Air O2 Flow Rate: O2 Flow Rate (L/min): 2  L/min  Intake/output summary:   Intake/Output Summary (Last 24 hours) at 03/03/2021 1433 Last data filed at 03/03/2021 1000 Gross per 24 hour  Intake 603 ml  Output --  Net 603 ml   LBM: Last BM Date: 02/27/21 Baseline Weight: Weight: 118.1 kg Most recent weight: Weight: 112 kg         Flowsheet Rows   [employer] Most Recent  Value  Intake Tab   Referral Department Critical care  Unit at Time of Referral ICU  Date Notified 02/28/21  Palliative Care Type New Palliative care  Reason for referral Clarify Goals of Care  Date of Admission 02/27/21  Date first seen by Palliative Care 02/28/21  # of days Palliative referral response time 0 Day(s)  # of days IP prior to Palliative referral 1  Clinical Assessment   Psychosocial & Spiritual Assessment   Palliative Care Outcomes       Patient Active Problem List   Diagnosis Date Noted  . Cardiac arrest ([place]) 02/27/2021  . GERD (gastroesophageal reflux disease) 08/23/2020  . Needs flu shot 08/23/2020  . Breast cancer screening by mammogram 03/12/2020  . Screening for malignant neoplasm of colon 03/12/2020  . Drug-induced hypokalemia 08/07/2019  . Lipoma of abdomen 07/30/2017  . Lipoma of right thigh 07/30/2017  . Hypertension 06/23/2016  . Hyperlipidemia 06/23/2016  . Thyroid nodule 03/16/2014    Palliative Care Assessment & Plan    Recommendations/Plan: Full code/full scope.   Code Status: Code Status History    Date Active Date Inactive Code Status Order ID Comments User Context   02/28/2021 0159 03/01/2021 1106 Full Code [id number]  Rust-ChesterHuel Cote, NP Inpatient   02/27/2021 7124 02/28/2021 0159 Full Code [id number]  Tyler Pita, MD ED   02/27/2021 1625 02/27/2021 1634 Full Code [id number]  Tyler Pita, MD ED   08/07/2019 0931 08/08/2019 1822 Full Code [id number]  Lang Snow, NP ED   Advance Care Planning Activity    Questions for Most Recent Historical Code Status (Order [id number])        Prognosis:  Unable to determine   Thank you for allowing the Palliative Medicine Team to assist in the care of this patient.   Total Time 25 min Prolonged Time Billed  no      Greater than 50%  of this time was spent counseling and coordinating care related to the above assessment and plan.  Asencion Gowda, NP  Please contact Palliative Medicine Team phone at [phone] for questions and concerns.

## 2021-03-03 NOTE — Consult Note (Signed)
ELECTROPHYSIOLOGY CONSULT NOTE  Patient ID: Julie James, MRN: [id number], DOB/AGE: [birth year] 70 y.o. Admit date: 02/27/2021 Date of Consult: 03/03/2021  Primary Physician: Verl Bangs, FNP Primary Cardiologist:  DC      Julie James is a 70 y.o. female who is being seen today for the evaluation of aborted cardiac arrest at the request of Dr. Manuella Ghazi   HPI Julie James is a 70 y.o. female admitted 3/27 following a witnessed aborted cardiac arrest.  On arrival of EMS, their note reviewed, no palpable pulse and agonal.  Compressions have been started by family.  AED advised shock x2.  Intubated and transported.  Treated with hypothermia with pressor support rewarming and extubation with initial concerns for hypoxic brain injury but now near normal cognitive function.  Peak high-sensitivity troponin about 500  Potassium 2.7 (previous therapy with hydrochlorothiazide) 8 hours later 3.7--interval treatment with 30 mEq IV and 40 mEq per tube.  First blood gas had a normal pH and normal PCO2 (potassium was 4.1 on 02/22/2021)  No prior history of palpitations.  No chest discomfort.  Significant dyspnea on exertion less than 100 feet.  Denies peripheral edema.  No family history of sudden cardiac death.  Has a history of hypertension bradycardia and relatively newly identified left bundle branch block not present 9/20 but present 1/21  DATE TEST EF   3//22 Echo   55-65 %              Past Medical History:  Diagnosis Date  . Hypercholesteremia   . Hypertension       Surgical History: No past surgical history on file.   Home Meds: Current Meds  Medication Sig  . amLODipine (NORVASC) 5 MG tablet Take 1 tablet (5 mg total) by mouth daily.  Marland Kitchen losartan-hydrochlorothiazide (HYZAAR) 100-12.5 MG tablet Take 1 tablet by mouth daily.  . metoprolol succinate (TOPROL-XL) 50 MG 24 hr tablet Take 1 tablet (50 mg total) by mouth daily. Take with or immediately following  a meal.  . simvastatin (ZOCOR) 40 MG tablet Take 1 tablet (40 mg total) by mouth at bedtime.    Allergies:  Allergies  Allergen Reactions  . Chlorthalidone Other (See Comments)    hypokalemia    Social History   Socioeconomic History  . Marital status: Married    Spouse name: Not on file  . Number of children: Not on file  . Years of education: Not on file  . Highest education level: Not on file  Occupational History  . Not on file  Tobacco Use  . Smoking status: Never Smoker  . Smokeless tobacco: Never Used  Vaping Use  . Vaping Use: Never used  Substance and Sexual Activity  . Alcohol use: No  . Drug use: No  . Sexual activity: Not on file  Other Topics Concern  . Not on file  Social History Narrative  . Not on file   Social Determinants of Health   Financial Resource Strain: Not on file  Food Insecurity: Not on file  Transportation Needs: Not on file  Physical Activity: Not on file  Stress: Not on file  Social Connections: Not on file  Intimate Partner Violence: Not on file     Family History  Problem Relation Age of Onset  . Cancer Sister        breast  . Cancer Sister        breast     ROS:  Please see the history of present  illness.     All other systems reviewed and negative.    Physical Exam: Blood pressure (!) 171/82, pulse 85, temperature 97.9 F (36.6 C), temperature source Oral, resp. rate 16, height 5' 5.98" (1.676 m), weight 112 kg, SpO2 94 %. General: Well developed, well nourished female in no acute distress. Head: Normocephalic, atraumatic, sclera non-icteric, no xanthomas, nares are without discharge. EENT: normal  Lymph Nodes:  none Neck: Negative for carotid bruits. JVD not elevated. Back:without scoliosis kyphosis Lungs: Clear bilaterally to auscultation without wheezes, rales, or rhonchi. Breathing is unlabored. Heart: RRR with S1 S2. 2/6 murmur . No rubs, or gallops appreciated. Abdomen: Soft, non-tender, non-distended with  normoactive bowel sounds. No hepatomegaly. No rebound/guarding. No obvious abdominal masses. Msk:  Strength and tone appear normal for age. Extremities: No clubbing or cyanosis. No  edema.  Distal pedal pulses are 2+ and equal bilaterally. Skin: Warm and Dry Neuro: Alert and oriented X 3. CN III-XII intact Grossly normal sensory and motor function . Psych:  Responds to questions appropriately with a normal affect.      Labs: Cardiac Enzymes No results for input(s): CKTOTAL, CKMB, TROPONINI in the last 72 hours. CBC Lab Results  Component Value Date   WBC 8.0 03/03/2021   HGB 11.3 (L) 03/03/2021   HCT 34.3 (L) 03/03/2021   MCV 88.2 03/03/2021   PLT 301 03/03/2021   PROTIME: No results for input(s): LABPROT, INR in the last 72 hours. Chemistry  Recent Labs  Lab 03/03/21 0531  NA 140  K 4.1  CL 104  CO2 28  BUN 13  CREATININE 0.84  CALCIUM 9.5  PROT 7.1  BILITOT 0.8  ALKPHOS 66  ALT 59*  AST 35  GLUCOSE 105*   Lipids Lab Results  Component Value Date   CHOL 179 02/22/2021   HDL 51 02/22/2021   LDLCALC 106 (H) 02/22/2021   TRIG 88 02/28/2021   BNP No results found for: PROBNP Thyroid Function Tests: No results for input(s): TSH, T4TOTAL, T3FREE, THYROIDAB in the last 72 hours.  Invalid input(s): FREET3 Miscellaneous No results found for: DDIMER  Radiology/Studies:  DG Chest 1 View  Result Date: 02/27/2021 CLINICAL DATA:  Central line placement EXAM: CHEST  1 VIEW COMPARISON:  02/27/2021 FINDINGS: Support Apparatus: --Endotracheal tube: Tip at the level of the clavicular heads. --Enteric tube:Tip and sideport project over the stomach. --Catheter(s):Left internal jugular vein approach central venous catheter tip is at the lower SVC --Other: None Mild cardiomegaly. No focal airspace consolidation or pulmonary edema. No pneumothorax. No pleural effusion. IMPRESSION: Endotracheal tube tip at the level of the clavicular heads. Electronically Signed   By: Ulyses Jarred M.D.   On: 02/27/2021 20:33   DG Abdomen 1 View  Result Date: 02/27/2021 CLINICAL DATA:  NG tube placement. EXAM: ABDOMEN - 1 VIEW COMPARISON:  Prior studies FINDINGS: An NG tube is noted with tip overlying the mid stomach. No other significant abnormalities noted. IMPRESSION: NG tube with tip overlying the mid stomach. Electronically Signed   By: Margarette Canada M.D.   On: 02/27/2021 16:29   MR BRAIN WO CONTRAST  Result Date: 03/02/2021 CLINICAL DATA:  Anoxic brain damage.  Status post cardiac arrest. EXAM: MRI HEAD WITHOUT CONTRAST TECHNIQUE: Multiplanar, multiecho pulse sequences of the brain and surrounding structures were obtained without intravenous contrast. COMPARISON:  Head CT 08/07/2019 FINDINGS: Brain: There is no evidence of an acute infarct, intracranial hemorrhage, mass, midline shift, or extra-axial fluid collection. No cerebral edema is seen to  indicate global hypoxic-ischemic injury. The ventricles and sulci are normal. There is a markedly enlarged empty sella which is unchanged. Vascular: Major intracranial vascular flow voids are preserved. Skull and upper cervical spine: Unremarkable bone marrow signal. Sinuses/Orbits: Unremarkable orbits. Paranasal sinuses and mastoid air cells are clear. Other: None. IMPRESSION: 1. No acute intracranial abnormality. 2. Chronic empty sella. Electronically Signed   By: Logan Bores M.D.   On: 03/02/2021 14:00   DG Chest Port 1 View  Result Date: 03/01/2021 CLINICAL DATA:  Acute respiratory failure EXAM: PORTABLE CHEST 1 VIEW COMPARISON:  02/28/2021 FINDINGS: Cardiac shadow is mildly enlarged but stable. Endotracheal tube, gastric catheter and left jugular central line are again seen and stable. No pneumothorax is noted. Lungs are well aerated bilaterally. No focal infiltrate or sizable effusion is seen. IMPRESSION: No acute abnormality noted. Tubes and lines as described. Electronically Signed   By: Inez Catalina M.D.   On: 03/01/2021 01:59   DG  Chest Port 1 View  Result Date: 02/28/2021 CLINICAL DATA:  Respiratory failure EXAM: PORTABLE CHEST 1 VIEW COMPARISON:  Chest radiograph February 27, 2021 FINDINGS: Endotracheal tube with tip overlying the midthoracic trachea. Left IJ central venous catheter with tip overlying the SVC. Nasogastric tube coursing below the diaphragm with tip obscured by collimation. Defibrillator leads overlie the left chest and left upper quadrant. The heart size and mediastinal contours are unchanged. No focal consolidation or overt pulmonary edema. No pleural effusion. No pneumothorax. The visualized skeletal structures are unchanged. IMPRESSION: 1. No acute cardiopulmonary findings. 2. Stable support apparatus. Electronically Signed   By: Dahlia Bailiff MD   On: 02/28/2021 03:04   DG Chest Portable 1 View  Result Date: 02/27/2021 CLINICAL DATA:  Intubation, cardiac arrest, code STEMI EXAM: PORTABLE CHEST 1 VIEW COMPARISON:  Portable exam 1607 hours compared to 12/13/2019 FINDINGS: Tip of endotracheal tube projects 3.2 cm above carina. Nasogastric tube extends into stomach. EKG leads and external pacing leads present. Enlargement of cardiac silhouette with slight vascular congestion. Atherosclerotic calcification aorta. Mediastinal contours normal. Subsegmental atelectasis LEFT lower lobe. Remaining lungs clear. No pleural effusion or pneumothorax. IMPRESSION: Subsegmental atelectasis LEFT lower lobe. Electronically Signed   By: Lavonia Dana M.D.   On: 02/27/2021 16:30   ECHOCARDIOGRAM COMPLETE  Result Date: 03/02/2021    ECHOCARDIOGRAM REPORT   Patient Name:   KARLENA LUEBKE Date of Exam: 03/02/2021 Medical Rec #:  [id number]          Height:       66.0 in Accession #:    [id number]         Weight:       264.6 lb Date of Birth:  1951-04-06          BSA:          2.252 m Patient Age:    86 years           BP:           164/66 mmHg Patient Gender: F                  HR:           85 bpm. Exam Location:  [facility] Procedure: 2D  Echo, Color Doppler, Cardiac Doppler and Intracardiac            Opacification Agent Indications:     I46.9 Cardiac arrest  History:         Patient has no prior history of Echocardiogram examinations.  Risk Factors:Hypertension and HCL.  Sonographer:     Charmayne Sheer RDCS (AE) Referring Phys:  [id number] Tyler Pita Diagnosing Phys: Kathlyn Sacramento MD  Sonographer Comments: Suboptimal apical window and suboptimal subcostal window. Image acquisition challenging due to patient body habitus. IMPRESSIONS  1. Left ventricular ejection fraction, by estimation, is 55 to 60%. The left ventricle has normal function. The left ventricle has no regional wall motion abnormalities. There is mild left ventricular hypertrophy. Left ventricular diastolic parameters are consistent with Grade I diastolic dysfunction (impaired relaxation).  2. Right ventricular systolic function is normal. The right ventricular size is normal. Tricuspid regurgitation signal is inadequate for assessing PA pressure.  3. The mitral valve is normal in structure. No evidence of mitral valve regurgitation. No evidence of mitral stenosis.  4. The aortic valve is normal in structure. Aortic valve regurgitation is not visualized. Mild aortic valve sclerosis is present, with no evidence of aortic valve stenosis.  5. The inferior vena cava is normal in size with greater than 50% respiratory variability, suggesting right atrial pressure of 3 mmHg.  6. Challenging image quality. FINDINGS  Left Ventricle: Left ventricular ejection fraction, by estimation, is 55 to 60%. The left ventricle has normal function. The left ventricle has no regional wall motion abnormalities. Definity contrast agent was given IV to delineate the left ventricular  endocardial borders. The left ventricular internal cavity size was normal in size. There is mild left ventricular hypertrophy. Left ventricular diastolic parameters are consistent with Grade I diastolic dysfunction  (impaired relaxation). Right Ventricle: The right ventricular size is normal. No increase in right ventricular wall thickness. Right ventricular systolic function is normal. Tricuspid regurgitation signal is inadequate for assessing PA pressure. Left Atrium: Left atrial size was normal in size. Right Atrium: Right atrial size was normal in size. Pericardium: There is no evidence of pericardial effusion. Mitral Valve: The mitral valve is normal in structure. No evidence of mitral valve regurgitation. No evidence of mitral valve stenosis. MV peak gradient, 13.5 mmHg. The mean mitral valve gradient is 6.0 mmHg. Tricuspid Valve: The tricuspid valve is normal in structure. Tricuspid valve regurgitation is trivial. No evidence of tricuspid stenosis. Aortic Valve: The aortic valve is normal in structure. Aortic valve regurgitation is not visualized. Mild aortic valve sclerosis is present, with no evidence of aortic valve stenosis. Aortic valve mean gradient measures 9.0 mmHg. Aortic valve peak gradient measures 15.7 mmHg. Aortic valve area, by VTI measures 2.50 cm. Pulmonic Valve: The pulmonic valve was normal in structure. Pulmonic valve regurgitation is not visualized. No evidence of pulmonic stenosis. Aorta: The aortic root is normal in size and structure. Venous: The inferior vena cava is normal in size with greater than 50% respiratory variability, suggesting right atrial pressure of 3 mmHg. IAS/Shunts: No atrial level shunt detected by color flow Doppler.  LEFT VENTRICLE PLAX 2D LVIDd:         4.50 cm  Diastology LVIDs:         3.10 cm  LV e' medial:    8.38 cm/s LV PW:         1.10 cm  LV E/e' medial:  11.3 LV IVS:        1.10 cm  LV e' lateral:   8.27 cm/s LVOT diam:     2.20 cm  LV E/e' lateral: 11.4 LV SV:         93 LV SV Index:   41 LVOT Area:     3.80 cm  LEFT ATRIUM  Index LA diam:    3.40 cm 1.51 cm/m  AORTIC VALVE                    PULMONIC VALVE AV Area (Vmax):    2.42 cm     PV Vmax:        1.49 m/s AV Area (Vmean):   2.38 cm     PV Vmean:      103.000 cm/s AV Area (VTI):     2.50 cm     PV VTI:        0.301 m AV Vmax:           198.00 cm/s  PV Peak grad:  8.9 mmHg AV Vmean:          138.000 cm/s PV Mean grad:  5.0 mmHg AV VTI:            0.373 m AV Peak Grad:      15.7 mmHg AV Mean Grad:      9.0 mmHg LVOT Vmax:         126.00 cm/s LVOT Vmean:        86.500 cm/s LVOT VTI:          0.245 m LVOT/AV VTI ratio: 0.66  AORTA Ao Root diam: 3.00 cm MITRAL VALVE MV Area (PHT): 5.16 cm     SHUNTS MV Area VTI:   3.58 cm     Systemic VTI:  0.24 m MV Peak grad:  13.5 mmHg    Systemic Diam: 2.20 cm MV Mean grad:  6.0 mmHg MV Vmax:       1.84 m/s MV Vmean:      110.0 cm/s MV Decel Time: 147 msec MV E velocity: 94.30 cm/s MV A velocity: 169.00 cm/s MV E/A ratio:  0.56 Kathlyn Sacramento MD Electronically signed by Kathlyn Sacramento MD Signature Date/Time: 03/02/2021/2:08:02 PM    Final     EKG: Sinus at 50 with left bundle branch block   Assessment and Plan:   Julie James

## 2021-03-03 NOTE — Progress Notes (Signed)
Received a message from nursing to call Ermalinda Barrios, a family friend.  He gave me a password Maxi.  Discussed with him thinking about this lady who had a cardiac arrest-aborted not clearly with a trigger and that the strategy would be a catheterization to exclude ischemic heart disease, a cMRI to look for other causes potentially flecainide infusion and or epinephrine infusion although these do not need to be done urgently, treadmill testing can also be useful to look for long QT, and then a defibrillator for secondary prevention  From the notes, her mental status has improved rapidly over the last day or so and I suspect it will continue to improve.  Today she was able to relate that she knew why she was in the hospital, as reflecting her ability to form new memories.  I do not think it is not an emergency to proceed with this evaluation although needs to be done prior to discharge

## 2021-03-03 NOTE — Progress Notes (Signed)
OT Cancellation Note  Patient Details Name: Julie James MRN: [id number] DOB: Oct 19, 1951   Cancelled Treatment:    Reason Eval/Treat Not Completed: Patient at procedure or test/ unavailable  RN in room addressing pt and family questions at this time. Will f/u for OT treatment at later date/time as able. Thank you.  Gerrianne Scale, [place], OTR/L ascom [phone] 03/03/21, 4:43 PM

## 2021-03-03 NOTE — Progress Notes (Signed)
I d/w Dr Jolyn Nap (EP) who will see patient today for further eval.

## 2021-03-03 NOTE — Progress Notes (Addendum)
Progress Note    Julie James  PIR:[id number] DOB: 30-Jun-1951  DOA: 02/27/2021 PCP: Verl Bangs, FNP      Brief Narrative:    Medical records reviewed and are as summarized below:  Julie James is a 70 y.o. female with medical history significant for hypertension, hypercholesterolemia, obesity, who was brought to the hospital because of cardiac arrest.  Apparently, she was in her usual state of health and was getting into her house when she fell backwards became unresponsive.  Family called 23 and started CPR.  Reportedly, patient had a shockable rhythm when EMS arrived so she was shocked x2 by the ED. Patient was brought to the hospital for further management.  She was intubated and placed on mechanical ventilation in the emergency room because of unresponsiveness.  She was subsequently admitted to the ICU where she was placed on hypothermia protocol and required vasopressors.     Assessment/Plan:   Active Problems:   Cardiac arrest [facility])   Nutrition Problem: Inadequate oral intake Etiology: inability to eat  Signs/Symptoms: NPO status   Body mass index is 39.89 kg/m.  (Obesity)  S/p out of hospital cardiac arrest: Required hypothermia protocol while in the ICU.  Patient was seen by electrophysiologist today and left heart cath was recommended.  Plan for left heart cath tomorrow. 2D echo on 03/02/2021 showed EF estimated at 55 to 60%, mild LVH, grade 1 diastolic dysfunction.  Sinus bradycardia and left bundle branch block on EKG  Acute hypoxic respiratory failure: S/p extubation on 03/01/2021.  Acute metabolic encephalopathy: Improved and she still has some memory impairment and intermittent confusion.  MRI brain negative for acute stroke or acute abnormality.  AKI and hyperkalemia: Resolved    Diet Order            Diet regular Room service appropriate? Yes with Assist; Fluid consistency: Thin  Diet effective now                     Consultants:  Cardiologist, electrophysiologist  Procedures:  Central venous catheter placement on 02/27/2021  Intubation and mechanical ventilation on 02/27/2021 and extubated on 03/01/2021    Medications:   . chlorhexidine  15 mL Mouth Rinse BID  . feeding supplement  237 mL Oral BID BM  . losartan  100 mg Oral Daily   And  . hydrochlorothiazide  12.5 mg Oral Daily  . mouth rinse  15 mL Mouth Rinse q12n4p  . metoprolol succinate  50 mg Oral Daily  . pantoprazole (PROTONIX) IV  40 mg Intravenous QHS  . sodium chloride flush  3 mL Intravenous Q12H   Continuous Infusions: . sodium chloride       Anti-infectives (From admission, onward)   None             Family Communication/Anticipated D/C date and plan/Code Status   DVT prophylaxis: Place and maintain sequential compression device Start: 03/01/21 1122     Code Status: Prior  Family Communication: None Disposition Plan:    Status is: Inpatient  Remains inpatient appropriate because:Inpatient level of care appropriate due to severity of illness   Dispo: The patient is from: Home              Anticipated d/c is to: Home              Patient currently is not medically stable to d/c.   Difficult to place patient No  Subjective:   Interval events noted.  No chest pain, palpitations, dizziness or shortness of breath.  Objective:    Vitals:   03/02/21 2000 03/02/21 2104 03/03/21 0457 03/03/21 0823  BP: (!) 141/70 (!) 140/59 115/60 (!) 171/82  Pulse: 82 60 85 85  Resp: 19 18 16    Temp:  98.4 F (36.9 C) 99.5 F (37.5 C) 97.9 F (36.6 C)  TempSrc:  Oral Oral Oral  SpO2: 93% 100% 91% 94%  Weight:   112 kg   Height:       No data found.   Intake/Output Summary (Last 24 hours) at 03/03/2021 1137 Last data filed at 03/03/2021 1000 Gross per 24 hour  Intake 603 ml  Output --  Net 603 ml   Filed Weights   02/27/21 1745 03/01/21 0500 03/03/21 0457  Weight: 118.1  kg 120 kg 112 kg    Exam:   GEN: NAD SKIN: Left upper back skin tear EYES: EOMI ENT: MMM CV: RRR PULM: CTA B ABD: soft, ND, NT, +BS CNS: AAO x 3, non focal EXT: No edema or tenderness        Data Reviewed:   I have personally reviewed following labs and imaging studies:  Labs: Labs show the following:   Basic Metabolic Panel: Recent Labs  Lab 02/27/21 1539 02/27/21 1758 02/27/21 2346 02/28/21 0454 03/01/21 0204 03/02/21 0605 03/03/21 0531  NA 139  --  136 134* 133* 137 140  K 2.7*  --  3.7 3.7 4.6 4.4 4.1  CL 103  --  101 103 105 106 104  CO2 23  --  25 21* 23 26 28   GLUCOSE 196*  --  198* 155* 127* 106* 105*  BUN 13  --  17 19 13 11 13   CREATININE 0.94 0.84 1.07* 1.44* 0.74 0.74 0.84  CALCIUM 9.0  --  8.5* 8.4* 8.6* 9.0 9.5  MG 2.1 2.1 2.0 1.7 1.9  --   --   PHOS 5.1* 4.4 3.5 4.6 2.9  --   --    GFR Estimated Creatinine Clearance: 82.5 mL/min (by C-G formula based on SCr of 0.84 mg/dL). Liver Function Tests: Recent Labs  Lab 02/27/21 1539 02/28/21 0454 03/01/21 0204 03/02/21 0605 03/03/21 0531  AST 207* 117* 65* 43* 35  ALT 196* 146* 110* 81* 59*  ALKPHOS 77 65 64 66 66  BILITOT 1.0 0.4 0.6 0.8 0.8  PROT 7.8 6.7 6.3* 6.6 7.1  ALBUMIN 4.3 3.6 3.3* 3.4* 3.6   No results for input(s): LIPASE, AMYLASE in the last 168 hours. No results for input(s): AMMONIA in the last 168 hours. Coagulation profile Recent Labs  Lab 02/27/21 1758 02/27/21 2346  INR 1.0 1.1    CBC: Recent Labs  Lab 02/27/21 1539 02/27/21 1758 02/28/21 0454 03/01/21 0204 03/02/21 0605 03/03/21 0531  WBC 7.9 11.5* 7.8 7.8 9.4 8.0  NEUTROABS 2.9  --   --  5.8 7.6 5.8  HGB 12.2 12.2 10.8* 9.9* 9.9* 11.3*  HCT 38.2 39.4 33.3* 29.9* 30.8* 34.3*  MCV 89.5 91.4 87.4 87.4 88.8 88.2  PLT 349 260 312 275 264 301   Cardiac Enzymes: No results for input(s): CKTOTAL, CKMB, CKMBINDEX, TROPONINI in the last 168 hours. BNP (last 3 results) No results for input(s): PROBNP in the  last 8760 hours. CBG: Recent Labs  Lab 03/01/21 0751 03/01/21 1112 03/01/21 1527 03/01/21 2343 03/02/21 0347  GLUCAP 97 112* 96 114* 98   D-Dimer: No results for input(s): DDIMER in the last  72 hours. Hgb A1c: No results for input(s): HGBA1C in the last 72 hours. Lipid Profile: No results for input(s): CHOL, HDL, LDLCALC, TRIG, CHOLHDL, LDLDIRECT in the last 72 hours. Thyroid function studies: No results for input(s): TSH, T4TOTAL, T3FREE, THYROIDAB in the last 72 hours.  Invalid input(s): FREET3 Anemia work up: No results for input(s): VITAMINB12, FOLATE, FERRITIN, TIBC, IRON, RETICCTPCT in the last 72 hours. Sepsis Labs: Recent Labs  Lab 02/27/21 1758 02/28/21 0454 03/01/21 0204 03/02/21 0605 03/03/21 0531  PROCALCITON <0.10  --   --   --   --   WBC 11.5* 7.8 7.8 9.4 8.0    Microbiology Recent Results (from the past 240 hour(s))  Resp Panel by RT-PCR (Flu A&B, Covid) Nasopharyngeal Swab     Status: None   Collection Time: 02/27/21  3:39 PM   Specimen: Nasopharyngeal Swab; Nasopharyngeal(NP) swabs in vial transport medium  Result Value Ref Range Status   SARS Coronavirus 2 by RT PCR NEGATIVE NEGATIVE Final    Comment: (NOTE) SARS-CoV-2 target nucleic acids are NOT DETECTED.  The SARS-CoV-2 RNA is generally detectable in upper respiratory specimens during the acute phase of infection. The lowest concentration of SARS-CoV-2 viral copies this assay can detect is 138 copies/mL. A negative result does not preclude SARS-Cov-2 infection and should not be used as the sole basis for treatment or other patient management decisions. A negative result may occur with  improper specimen collection/handling, submission of specimen other than nasopharyngeal swab, presence of viral mutation(s) within the areas targeted by this assay, and inadequate number of viral copies(<138 copies/mL). A negative result must be combined with clinical observations, patient history, and  epidemiological information. The expected result is Negative.  Fact Sheet for Patients:  EntrepreneurPulse.com.au  Fact Sheet for Healthcare Providers:  IncredibleEmployment.be  This test is no t yet approved or cleared by the [place] FDA and  has been authorized for detection and/or diagnosis of SARS-CoV-2 by FDA under an Emergency Use Authorization (EUA). This EUA will remain  in effect (meaning this test can be used) for the duration of the COVID-19 declaration under Section 564(b)(1) of the Act, 21 U.S.C.section 360bbb-3(b)(1), unless the authorization is terminated  or revoked sooner.       Influenza A by PCR NEGATIVE NEGATIVE Final   Influenza B by PCR NEGATIVE NEGATIVE Final    Comment: (NOTE) The Xpert Xpress SARS-CoV-2/FLU/RSV plus assay is intended as an aid in the diagnosis of influenza from Nasopharyngeal swab specimens and should not be used as a sole basis for treatment. Nasal washings and aspirates are unacceptable for Xpert Xpress SARS-CoV-2/FLU/RSV testing.  Fact Sheet for Patients: EntrepreneurPulse.com.au  Fact Sheet for Healthcare Providers: IncredibleEmployment.be  This test is not yet approved or cleared by the [place] FDA and has been authorized for detection and/or diagnosis of SARS-CoV-2 by FDA under an Emergency Use Authorization (EUA). This EUA will remain in effect (meaning this test can be used) for the duration of the COVID-19 declaration under Section 564(b)(1) of the Act, 21 U.S.C. section 360bbb-3(b)(1), unless the authorization is terminated or revoked.  Performed at [facility], [address] [place], [place] [address]   MRSA PCR Screening     Status: None   Collection Time: 02/27/21  5:30 PM   Specimen: Nasopharyngeal  Result Value Ref Range Status   MRSA by PCR NEGATIVE NEGATIVE Final    Comment:        The GeneXpert MRSA Assay  (FDA approved for NASAL specimens  only), is one component of a comprehensive MRSA colonization surveillance program. It is not intended to diagnose MRSA infection nor to guide or monitor treatment for MRSA infections. Performed at [facility], [address], [place], [place] [address]     Procedures and diagnostic studies:  MR BRAIN WO CONTRAST  Result Date: 03/02/2021 CLINICAL DATA:  Anoxic brain damage.  Status post cardiac arrest. EXAM: MRI HEAD WITHOUT CONTRAST TECHNIQUE: Multiplanar, multiecho pulse sequences of the brain and surrounding structures were obtained without intravenous contrast. COMPARISON:  Head CT 08/07/2019 FINDINGS: Brain: There is no evidence of an acute infarct, intracranial hemorrhage, mass, midline shift, or extra-axial fluid collection. No cerebral edema is seen to indicate global hypoxic-ischemic injury. The ventricles and sulci are normal. There is a markedly enlarged empty sella which is unchanged. Vascular: Major intracranial vascular flow voids are preserved. Skull and upper cervical spine: Unremarkable bone marrow signal. Sinuses/Orbits: Unremarkable orbits. Paranasal sinuses and mastoid air cells are clear. Other: None. IMPRESSION: 1. No acute intracranial abnormality. 2. Chronic empty sella. Electronically Signed   By: Logan Bores M.D.   On: 03/02/2021 14:00   ECHOCARDIOGRAM COMPLETE  Result Date: 03/02/2021    ECHOCARDIOGRAM REPORT   Patient Name:   TRAEH MILROY Date of Exam: 03/02/2021 Medical Rec #:  [id number]          Height:       66.0 in Accession #:    [id number]         Weight:       264.6 lb Date of Birth:  [birth year]          BSA:          2.252 m Patient Age:    45 years           BP:           164/66 mmHg Patient Gender: F                  HR:           85 bpm. Exam Location:  [facility] Procedure: 2D Echo, Color Doppler, Cardiac Doppler and Intracardiac            Opacification Agent Indications:     I46.9 Cardiac arrest  History:          Patient has no prior history of Echocardiogram examinations.                  Risk Factors:Hypertension and HCL.  Sonographer:     Charmayne Sheer RDCS (AE) Referring Phys:  [id number] Diagnosing Phys: Kathlyn Sacramento MD  Sonographer Comments: Suboptimal apical window and suboptimal subcostal window. Image acquisition challenging due to patient body habitus. IMPRESSIONS  1. Left ventricular ejection fraction, by estimation, is 55 to 60%. The left ventricle has normal function. The left ventricle has no regional wall motion abnormalities. There is mild left ventricular hypertrophy. Left ventricular diastolic parameters are consistent with Grade I diastolic dysfunction (impaired relaxation).  2. Right ventricular systolic function is normal. The right ventricular size is normal. Tricuspid regurgitation signal is inadequate for assessing PA pressure.  3. The mitral valve is normal in structure. No evidence of mitral valve regurgitation. No evidence of mitral stenosis.  4. The aortic valve is normal in structure. Aortic valve regurgitation is not visualized. Mild aortic valve sclerosis is present, with no evidence of aortic valve stenosis.  5. The inferior vena cava is normal in size with greater than 50% respiratory  variability, suggesting right atrial pressure of 3 mmHg.  6. Challenging image quality. FINDINGS  Left Ventricle: Left ventricular ejection fraction, by estimation, is 55 to 60%. The left ventricle has normal function. The left ventricle has no regional wall motion abnormalities. Definity contrast agent was given IV to delineate the left ventricular  endocardial borders. The left ventricular internal cavity size was normal in size. There is mild left ventricular hypertrophy. Left ventricular diastolic parameters are consistent with Grade I diastolic dysfunction (impaired relaxation). Right Ventricle: The right ventricular size is normal. No increase in right ventricular wall thickness. Right  ventricular systolic function is normal. Tricuspid regurgitation signal is inadequate for assessing PA pressure. Left Atrium: Left atrial size was normal in size. Right Atrium: Right atrial size was normal in size. Pericardium: There is no evidence of pericardial effusion. Mitral Valve: The mitral valve is normal in structure. No evidence of mitral valve regurgitation. No evidence of mitral valve stenosis. MV peak gradient, 13.5 mmHg. The mean mitral valve gradient is 6.0 mmHg. Tricuspid Valve: The tricuspid valve is normal in structure. Tricuspid valve regurgitation is trivial. No evidence of tricuspid stenosis. Aortic Valve: The aortic valve is normal in structure. Aortic valve regurgitation is not visualized. Mild aortic valve sclerosis is present, with no evidence of aortic valve stenosis. Aortic valve mean gradient measures 9.0 mmHg. Aortic valve peak gradient measures 15.7 mmHg. Aortic valve area, by VTI measures 2.50 cm. Pulmonic Valve: The pulmonic valve was normal in structure. Pulmonic valve regurgitation is not visualized. No evidence of pulmonic stenosis. Aorta: The aortic root is normal in size and structure. Venous: The inferior vena cava is normal in size with greater than 50% respiratory variability, suggesting right atrial pressure of 3 mmHg. IAS/Shunts: No atrial level shunt detected by color flow Doppler.  LEFT VENTRICLE PLAX 2D LVIDd:         4.50 cm  Diastology LVIDs:         3.10 cm  LV e' medial:    8.38 cm/s LV PW:         1.10 cm  LV E/e' medial:  11.3 LV IVS:        1.10 cm  LV e' lateral:   8.27 cm/s LVOT diam:     2.20 cm  LV E/e' lateral: 11.4 LV SV:         93 LV SV Index:   41 LVOT Area:     3.80 cm  LEFT ATRIUM         Index LA diam:    3.40 cm 1.51 cm/m  AORTIC VALVE                    PULMONIC VALVE AV Area (Vmax):    2.42 cm     PV Vmax:       1.49 m/s AV Area (Vmean):   2.38 cm     PV Vmean:      103.000 cm/s AV Area (VTI):     2.50 cm     PV VTI:        0.301 m AV Vmax:            198.00 cm/s  PV Peak grad:  8.9 mmHg AV Vmean:          138.000 cm/s PV Mean grad:  5.0 mmHg AV VTI:            0.373 m AV Peak Grad:      15.7 mmHg AV Mean Grad:  9.0 mmHg LVOT Vmax:         126.00 cm/s LVOT Vmean:        86.500 cm/s LVOT VTI:          0.245 m LVOT/AV VTI ratio: 0.66  AORTA Ao Root diam: 3.00 cm MITRAL VALVE MV Area (PHT): 5.16 cm     SHUNTS MV Area VTI:   3.58 cm     Systemic VTI:  0.24 m MV Peak grad:  13.5 mmHg    Systemic Diam: 2.20 cm MV Mean grad:  6.0 mmHg MV Vmax:       1.84 m/s MV Vmean:      110.0 cm/s MV Decel Time: 147 msec MV E velocity: 94.30 cm/s MV A velocity: 169.00 cm/s MV E/A ratio:  0.56 Kathlyn Sacramento MD Electronically signed by Kathlyn Sacramento MD Signature Date/Time: 03/02/2021/2:08:02 PM    Final                LOS: 4 days   Lyndel Sarate  [facility]   Pager on [email or web address]. If 7PM-7AM, please contact night-coverage at [email or web address]     03/03/2021, 11:37 AM

## 2021-03-03 NOTE — H&P (View-Only) (Signed)
ELECTROPHYSIOLOGY CONSULT NOTE  Patient ID: Julie James, MRN: [id number], DOB/AGE: [birth year] 70 y.o. Admit date: 02/27/2021 Date of Consult: 03/03/2021  Primary Physician: Verl Bangs, FNP Primary Cardiologist:  DC      Julie James is a 70 y.o. female who is being seen today for the evaluation of aborted cardiac arrest at the request of Dr. Manuella Ghazi   HPI Julie James is a 70 y.o. female admitted 3/27 following a witnessed aborted cardiac arrest.  On arrival of EMS, their note reviewed, no palpable pulse and agonal.  Compressions have been started by family.  AED advised shock x2.  Intubated and transported.  Treated with hypothermia with pressor support rewarming and extubation with initial concerns for hypoxic brain injury but now near normal cognitive function.  Peak high-sensitivity troponin about 500  Potassium 2.7 (previous therapy with hydrochlorothiazide) 8 hours later 3.7--interval treatment with 30 mEq IV and 40 mEq per tube.  First blood gas had a normal pH and normal PCO2 (potassium was 4.1 on 02/22/2021)  No prior history of palpitations.  No chest discomfort.  Significant dyspnea on exertion less than 100 feet.  Denies peripheral edema.  No family history of sudden cardiac death.  Has a history of hypertension bradycardia and relatively newly identified left bundle branch block not present 9/20 but present 1/21  DATE TEST EF   3//22 Echo   55-65 %              Past Medical History:  Diagnosis Date  . Hypercholesteremia   . Hypertension       Surgical History: No past surgical history on file.   Home Meds: Current Meds  Medication Sig  . amLODipine (NORVASC) 5 MG tablet Take 1 tablet (5 mg total) by mouth daily.  Marland Kitchen losartan-hydrochlorothiazide (HYZAAR) 100-12.5 MG tablet Take 1 tablet by mouth daily.  . metoprolol succinate (TOPROL-XL) 50 MG 24 hr tablet Take 1 tablet (50 mg total) by mouth daily. Take with or immediately following  a meal.  . simvastatin (ZOCOR) 40 MG tablet Take 1 tablet (40 mg total) by mouth at bedtime.    Allergies:  Allergies  Allergen Reactions  . Chlorthalidone Other (See Comments)    hypokalemia    Social History   Socioeconomic History  . Marital status: Married    Spouse name: Not on file  . Number of children: Not on file  . Years of education: Not on file  . Highest education level: Not on file  Occupational History  . Not on file  Tobacco Use  . Smoking status: Never Smoker  . Smokeless tobacco: Never Used  Vaping Use  . Vaping Use: Never used  Substance and Sexual Activity  . Alcohol use: No  . Drug use: No  . Sexual activity: Not on file  Other Topics Concern  . Not on file  Social History Narrative  . Not on file   Social Determinants of Health   Financial Resource Strain: Not on file  Food Insecurity: Not on file  Transportation Needs: Not on file  Physical Activity: Not on file  Stress: Not on file  Social Connections: Not on file  Intimate Partner Violence: Not on file     Family History  Problem Relation Age of Onset  . Cancer Sister        breast  . Cancer Sister        breast     ROS:  Please see the history of present  illness.     All other systems reviewed and negative.    Physical Exam: Blood pressure (!) 171/82, pulse 85, temperature 97.9 F (36.6 C), temperature source Oral, resp. rate 16, height 5' 5.98" (1.676 m), weight 112 kg, SpO2 94 %. General: Well developed, well nourished female in no acute distress. Head: Normocephalic, atraumatic, sclera non-icteric, no xanthomas, nares are without discharge. EENT: normal  Lymph Nodes:  none Neck: Negative for carotid bruits. JVD not elevated. Back:without scoliosis kyphosis Lungs: Clear bilaterally to auscultation without wheezes, rales, or rhonchi. Breathing is unlabored. Heart: RRR with S1 S2. 2/6 murmur . No rubs, or gallops appreciated. Abdomen: Soft, non-tender, non-distended with  normoactive bowel sounds. No hepatomegaly. No rebound/guarding. No obvious abdominal masses. Msk:  Strength and tone appear normal for age. Extremities: No clubbing or cyanosis. No  edema.  Distal pedal pulses are 2+ and equal bilaterally. Skin: Warm and Dry Neuro: Alert and oriented X 3. CN III-XII intact Grossly normal sensory and motor function . Psych:  Responds to questions appropriately with a normal affect.      Labs: Cardiac Enzymes No results for input(s): CKTOTAL, CKMB, TROPONINI in the last 72 hours. CBC Lab Results  Component Value Date   WBC 8.0 03/03/2021   HGB 11.3 (L) 03/03/2021   HCT 34.3 (L) 03/03/2021   MCV 88.2 03/03/2021   PLT 301 03/03/2021   PROTIME: No results for input(s): LABPROT, INR in the last 72 hours. Chemistry  Recent Labs  Lab 03/03/21 0531  NA 140  K 4.1  CL 104  CO2 28  BUN 13  CREATININE 0.84  CALCIUM 9.5  PROT 7.1  BILITOT 0.8  ALKPHOS 66  ALT 59*  AST 35  GLUCOSE 105*   Lipids Lab Results  Component Value Date   CHOL 179 02/22/2021   HDL 51 02/22/2021   LDLCALC 106 (H) 02/22/2021   TRIG 88 02/28/2021   BNP No results found for: PROBNP Thyroid Function Tests: No results for input(s): TSH, T4TOTAL, T3FREE, THYROIDAB in the last 72 hours.  Invalid input(s): FREET3 Miscellaneous No results found for: DDIMER  Radiology/Studies:  DG Chest 1 View  Result Date: 02/27/2021 CLINICAL DATA:  Central line placement EXAM: CHEST  1 VIEW COMPARISON:  02/27/2021 FINDINGS: Support Apparatus: --Endotracheal tube: Tip at the level of the clavicular heads. --Enteric tube:Tip and sideport project over the stomach. --Catheter(s):Left internal jugular vein approach central venous catheter tip is at the lower SVC --Other: None Mild cardiomegaly. No focal airspace consolidation or pulmonary edema. No pneumothorax. No pleural effusion. IMPRESSION: Endotracheal tube tip at the level of the clavicular heads. Electronically Signed   By: Ulyses Jarred M.D.   On: 02/27/2021 20:33   DG Abdomen 1 View  Result Date: 02/27/2021 CLINICAL DATA:  NG tube placement. EXAM: ABDOMEN - 1 VIEW COMPARISON:  Prior studies FINDINGS: An NG tube is noted with tip overlying the mid stomach. No other significant abnormalities noted. IMPRESSION: NG tube with tip overlying the mid stomach. Electronically Signed   By: Margarette Canada M.D.   On: 02/27/2021 16:29   MR BRAIN WO CONTRAST  Result Date: 03/02/2021 CLINICAL DATA:  Anoxic brain damage.  Status post cardiac arrest. EXAM: MRI HEAD WITHOUT CONTRAST TECHNIQUE: Multiplanar, multiecho pulse sequences of the brain and surrounding structures were obtained without intravenous contrast. COMPARISON:  Head CT 08/07/2019 FINDINGS: Brain: There is no evidence of an acute infarct, intracranial hemorrhage, mass, midline shift, or extra-axial fluid collection. No cerebral edema is seen to  indicate global hypoxic-ischemic injury. The ventricles and sulci are normal. There is a markedly enlarged empty sella which is unchanged. Vascular: Major intracranial vascular flow voids are preserved. Skull and upper cervical spine: Unremarkable bone marrow signal. Sinuses/Orbits: Unremarkable orbits. Paranasal sinuses and mastoid air cells are clear. Other: None. IMPRESSION: 1. No acute intracranial abnormality. 2. Chronic empty sella. Electronically Signed   By: Logan Bores M.D.   On: 03/02/2021 14:00   DG Chest Port 1 View  Result Date: 03/01/2021 CLINICAL DATA:  Acute respiratory failure EXAM: PORTABLE CHEST 1 VIEW COMPARISON:  02/28/2021 FINDINGS: Cardiac shadow is mildly enlarged but stable. Endotracheal tube, gastric catheter and left jugular central line are again seen and stable. No pneumothorax is noted. Lungs are well aerated bilaterally. No focal infiltrate or sizable effusion is seen. IMPRESSION: No acute abnormality noted. Tubes and lines as described. Electronically Signed   By: Inez Catalina M.D.   On: 03/01/2021 01:59   DG  Chest Port 1 View  Result Date: 02/28/2021 CLINICAL DATA:  Respiratory failure EXAM: PORTABLE CHEST 1 VIEW COMPARISON:  Chest radiograph February 27, 2021 FINDINGS: Endotracheal tube with tip overlying the midthoracic trachea. Left IJ central venous catheter with tip overlying the SVC. Nasogastric tube coursing below the diaphragm with tip obscured by collimation. Defibrillator leads overlie the left chest and left upper quadrant. The heart size and mediastinal contours are unchanged. No focal consolidation or overt pulmonary edema. No pleural effusion. No pneumothorax. The visualized skeletal structures are unchanged. IMPRESSION: 1. No acute cardiopulmonary findings. 2. Stable support apparatus. Electronically Signed   By: Dahlia Bailiff MD   On: 02/28/2021 03:04   DG Chest Portable 1 View  Result Date: 02/27/2021 CLINICAL DATA:  Intubation, cardiac arrest, code STEMI EXAM: PORTABLE CHEST 1 VIEW COMPARISON:  Portable exam 1607 hours compared to 12/13/2019 FINDINGS: Tip of endotracheal tube projects 3.2 cm above carina. Nasogastric tube extends into stomach. EKG leads and external pacing leads present. Enlargement of cardiac silhouette with slight vascular congestion. Atherosclerotic calcification aorta. Mediastinal contours normal. Subsegmental atelectasis LEFT lower lobe. Remaining lungs clear. No pleural effusion or pneumothorax. IMPRESSION: Subsegmental atelectasis LEFT lower lobe. Electronically Signed   By: Lavonia Dana M.D.   On: 02/27/2021 16:30   ECHOCARDIOGRAM COMPLETE  Result Date: 03/02/2021    ECHOCARDIOGRAM REPORT   Patient Name:   ALAYNA MABE Date of Exam: 03/02/2021 Medical Rec #:  [id number]          Height:       66.0 in Accession #:    [id number]         Weight:       264.6 lb Date of Birth:  12-May-1951          BSA:          2.252 m Patient Age:    74 years           BP:           164/66 mmHg Patient Gender: F                  HR:           85 bpm. Exam Location:  [facility] Procedure: 2D  Echo, Color Doppler, Cardiac Doppler and Intracardiac            Opacification Agent Indications:     I46.9 Cardiac arrest  History:         Patient has no prior history of Echocardiogram examinations.  Risk Factors:Hypertension and HCL.  Sonographer:     Charmayne Sheer RDCS (AE) Referring Phys:  [id number] Tyler Pita Diagnosing Phys: Kathlyn Sacramento MD  Sonographer Comments: Suboptimal apical window and suboptimal subcostal window. Image acquisition challenging due to patient body habitus. IMPRESSIONS  1. Left ventricular ejection fraction, by estimation, is 55 to 60%. The left ventricle has normal function. The left ventricle has no regional wall motion abnormalities. There is mild left ventricular hypertrophy. Left ventricular diastolic parameters are consistent with Grade I diastolic dysfunction (impaired relaxation).  2. Right ventricular systolic function is normal. The right ventricular size is normal. Tricuspid regurgitation signal is inadequate for assessing PA pressure.  3. The mitral valve is normal in structure. No evidence of mitral valve regurgitation. No evidence of mitral stenosis.  4. The aortic valve is normal in structure. Aortic valve regurgitation is not visualized. Mild aortic valve sclerosis is present, with no evidence of aortic valve stenosis.  5. The inferior vena cava is normal in size with greater than 50% respiratory variability, suggesting right atrial pressure of 3 mmHg.  6. Challenging image quality. FINDINGS  Left Ventricle: Left ventricular ejection fraction, by estimation, is 55 to 60%. The left ventricle has normal function. The left ventricle has no regional wall motion abnormalities. Definity contrast agent was given IV to delineate the left ventricular  endocardial borders. The left ventricular internal cavity size was normal in size. There is mild left ventricular hypertrophy. Left ventricular diastolic parameters are consistent with Grade I diastolic dysfunction  (impaired relaxation). Right Ventricle: The right ventricular size is normal. No increase in right ventricular wall thickness. Right ventricular systolic function is normal. Tricuspid regurgitation signal is inadequate for assessing PA pressure. Left Atrium: Left atrial size was normal in size. Right Atrium: Right atrial size was normal in size. Pericardium: There is no evidence of pericardial effusion. Mitral Valve: The mitral valve is normal in structure. No evidence of mitral valve regurgitation. No evidence of mitral valve stenosis. MV peak gradient, 13.5 mmHg. The mean mitral valve gradient is 6.0 mmHg. Tricuspid Valve: The tricuspid valve is normal in structure. Tricuspid valve regurgitation is trivial. No evidence of tricuspid stenosis. Aortic Valve: The aortic valve is normal in structure. Aortic valve regurgitation is not visualized. Mild aortic valve sclerosis is present, with no evidence of aortic valve stenosis. Aortic valve mean gradient measures 9.0 mmHg. Aortic valve peak gradient measures 15.7 mmHg. Aortic valve area, by VTI measures 2.50 cm. Pulmonic Valve: The pulmonic valve was normal in structure. Pulmonic valve regurgitation is not visualized. No evidence of pulmonic stenosis. Aorta: The aortic root is normal in size and structure. Venous: The inferior vena cava is normal in size with greater than 50% respiratory variability, suggesting right atrial pressure of 3 mmHg. IAS/Shunts: No atrial level shunt detected by color flow Doppler.  LEFT VENTRICLE PLAX 2D LVIDd:         4.50 cm  Diastology LVIDs:         3.10 cm  LV e' medial:    8.38 cm/s LV PW:         1.10 cm  LV E/e' medial:  11.3 LV IVS:        1.10 cm  LV e' lateral:   8.27 cm/s LVOT diam:     2.20 cm  LV E/e' lateral: 11.4 LV SV:         93 LV SV Index:   41 LVOT Area:     3.80 cm  LEFT ATRIUM  Index LA diam:    3.40 cm 1.51 cm/m  AORTIC VALVE                    PULMONIC VALVE AV Area (Vmax):    2.42 cm     PV Vmax:        1.49 m/s AV Area (Vmean):   2.38 cm     PV Vmean:      103.000 cm/s AV Area (VTI):     2.50 cm     PV VTI:        0.301 m AV Vmax:           198.00 cm/s  PV Peak grad:  8.9 mmHg AV Vmean:          138.000 cm/s PV Mean grad:  5.0 mmHg AV VTI:            0.373 m AV Peak Grad:      15.7 mmHg AV Mean Grad:      9.0 mmHg LVOT Vmax:         126.00 cm/s LVOT Vmean:        86.500 cm/s LVOT VTI:          0.245 m LVOT/AV VTI ratio: 0.66  AORTA Ao Root diam: 3.00 cm MITRAL VALVE MV Area (PHT): 5.16 cm     SHUNTS MV Area VTI:   3.58 cm     Systemic VTI:  0.24 m MV Peak grad:  13.5 mmHg    Systemic Diam: 2.20 cm MV Mean grad:  6.0 mmHg MV Vmax:       1.84 m/s MV Vmean:      110.0 cm/s MV Decel Time: 147 msec MV E velocity: 94.30 cm/s MV A velocity: 169.00 cm/s MV E/A ratio:  0.56 Kathlyn Sacramento MD Electronically signed by Kathlyn Sacramento MD Signature Date/Time: 03/02/2021/2:08:02 PM    Final     EKG: Sinus at 50 with left bundle branch block   Assessment and Plan:   Virl Axe

## 2021-03-04 ENCOUNTER — Encounter: Payer: Self-pay | Admitting: Internal Medicine

## 2021-03-04 ENCOUNTER — Encounter
Admission: EM | Disposition: A | Discharge status: Other Acute Inpatient Hospital | Payer: Self-pay | Source: Home / Self Care | Attending: Internal Medicine

## 2021-03-04 ENCOUNTER — Inpatient Hospital Stay (HOSPITAL_COMMUNITY)
Admission: AD | Admit: 2021-03-04 | Discharge: 2021-03-09 | DRG: 227 | Disposition: A | Discharge status: Home / Self Care | Payer: Medicare Other | Source: Other Acute Inpatient Hospital | Attending: Internal Medicine | Admitting: Internal Medicine

## 2021-03-04 DIAGNOSIS — Z95810 Presence of automatic (implantable) cardiac defibrillator: Secondary | ICD-10-CM

## 2021-03-04 DIAGNOSIS — D649 Anemia, unspecified: Secondary | ICD-10-CM | POA: Diagnosis present

## 2021-03-04 DIAGNOSIS — R7303 Prediabetes: Secondary | ICD-10-CM | POA: Diagnosis present

## 2021-03-04 DIAGNOSIS — E785 Hyperlipidemia, unspecified: Secondary | ICD-10-CM | POA: Diagnosis present

## 2021-03-04 DIAGNOSIS — I10 Essential (primary) hypertension: Secondary | ICD-10-CM | POA: Diagnosis not present

## 2021-03-04 DIAGNOSIS — J9601 Acute respiratory failure with hypoxia: Secondary | ICD-10-CM | POA: Diagnosis not present

## 2021-03-04 DIAGNOSIS — I5022 Chronic systolic (congestive) heart failure: Secondary | ICD-10-CM | POA: Diagnosis present

## 2021-03-04 DIAGNOSIS — Z79899 Other long term (current) drug therapy: Secondary | ICD-10-CM

## 2021-03-04 DIAGNOSIS — I428 Other cardiomyopathies: Secondary | ICD-10-CM | POA: Diagnosis present

## 2021-03-04 DIAGNOSIS — Z959 Presence of cardiac and vascular implant and graft, unspecified: Secondary | ICD-10-CM

## 2021-03-04 DIAGNOSIS — I469 Cardiac arrest, cause unspecified: Secondary | ICD-10-CM | POA: Diagnosis not present

## 2021-03-04 DIAGNOSIS — I110 Hypertensive heart disease with heart failure: Secondary | ICD-10-CM | POA: Diagnosis present

## 2021-03-04 DIAGNOSIS — K7200 Acute and subacute hepatic failure without coma: Secondary | ICD-10-CM | POA: Diagnosis not present

## 2021-03-04 DIAGNOSIS — I447 Left bundle-branch block, unspecified: Secondary | ICD-10-CM | POA: Diagnosis present

## 2021-03-04 DIAGNOSIS — E669 Obesity, unspecified: Secondary | ICD-10-CM | POA: Diagnosis present

## 2021-03-04 DIAGNOSIS — E876 Hypokalemia: Secondary | ICD-10-CM | POA: Diagnosis present

## 2021-03-04 DIAGNOSIS — I517 Cardiomegaly: Secondary | ICD-10-CM | POA: Diagnosis not present

## 2021-03-04 DIAGNOSIS — Z20822 Contact with and (suspected) exposure to covid-19: Secondary | ICD-10-CM | POA: Diagnosis not present

## 2021-03-04 DIAGNOSIS — I5021 Acute systolic (congestive) heart failure: Secondary | ICD-10-CM | POA: Diagnosis not present

## 2021-03-04 DIAGNOSIS — Z6839 Body mass index (BMI) 39.0-39.9, adult: Secondary | ICD-10-CM | POA: Diagnosis not present

## 2021-03-04 DIAGNOSIS — J9602 Acute respiratory failure with hypercapnia: Secondary | ICD-10-CM | POA: Diagnosis not present

## 2021-03-04 DIAGNOSIS — Z8674 Personal history of sudden cardiac arrest: Secondary | ICD-10-CM

## 2021-03-04 DIAGNOSIS — J811 Chronic pulmonary edema: Secondary | ICD-10-CM | POA: Diagnosis not present

## 2021-03-04 DIAGNOSIS — Z7189 Other specified counseling: Secondary | ICD-10-CM | POA: Diagnosis not present

## 2021-03-04 HISTORY — PX: LEFT HEART CATH AND CORONARY ANGIOGRAPHY: CATH118249

## 2021-03-04 LAB — CBC
HCT: 36 % (ref 36.0–46.0)
Hemoglobin: 11.6 g/dL — ABNORMAL LOW (ref 12.0–15.0)
MCH: 28.9 pg (ref 26.0–34.0)
MCHC: 32.2 g/dL (ref 30.0–36.0)
MCV: 89.8 fL (ref 80.0–100.0)
Platelets: 335 10*3/uL (ref 150–400)
RBC: 4.01 MIL/uL (ref 3.87–5.11)
RDW: 14 % (ref 11.5–15.5)
WBC: 7.3 10*3/uL (ref 4.0–10.5)
nRBC: 0 % (ref 0.0–0.2)

## 2021-03-04 LAB — CBC WITH DIFFERENTIAL/PLATELET
Abs Immature Granulocytes: 0.06 10*3/uL (ref 0.00–0.07)
Basophils Absolute: 0.1 10*3/uL (ref 0.0–0.1)
Basophils Relative: 1 %
Eosinophils Absolute: 0.1 10*3/uL (ref 0.0–0.5)
Eosinophils Relative: 1 %
HCT: 35.9 % — ABNORMAL LOW (ref 36.0–46.0)
Hemoglobin: 11.7 g/dL — ABNORMAL LOW (ref 12.0–15.0)
Immature Granulocytes: 1 %
Lymphocytes Relative: 23 %
Lymphs Abs: 1.7 10*3/uL (ref 0.7–4.0)
MCH: 28.6 pg (ref 26.0–34.0)
MCHC: 32.6 g/dL (ref 30.0–36.0)
MCV: 87.8 fL (ref 80.0–100.0)
Monocytes Absolute: 0.7 10*3/uL (ref 0.1–1.0)
Monocytes Relative: 10 %
Neutro Abs: 4.8 10*3/uL (ref 1.7–7.7)
Neutrophils Relative %: 64 %
Platelets: 322 10*3/uL (ref 150–400)
RBC: 4.09 MIL/uL (ref 3.87–5.11)
RDW: 14 % (ref 11.5–15.5)
WBC: 7.4 10*3/uL (ref 4.0–10.5)
nRBC: 0 % (ref 0.0–0.2)

## 2021-03-04 LAB — COMPREHENSIVE METABOLIC PANEL
ALT: 46 U/L — ABNORMAL HIGH (ref 0–44)
AST: 28 U/L (ref 15–41)
Albumin: 3.8 g/dL (ref 3.5–5.0)
Alkaline Phosphatase: 64 U/L (ref 38–126)
Anion gap: 10 (ref 5–15)
BUN: 14 mg/dL (ref 8–23)
CO2: 29 mmol/L (ref 22–32)
Calcium: 9.4 mg/dL (ref 8.9–10.3)
Chloride: 100 mmol/L (ref 98–111)
Creatinine, Ser: 0.94 mg/dL (ref 0.44–1.00)
GFR, Estimated: >60 mL/min (ref >60)
Glucose, Bld: 109 mg/dL — ABNORMAL HIGH (ref 70–99)
Potassium: 3.8 mmol/L (ref 3.5–5.1)
Sodium: 139 mmol/L (ref 135–145)
Total Bilirubin: 0.8 mg/dL (ref 0.3–1.2)
Total Protein: 7.5 g/dL (ref 6.5–8.1)

## 2021-03-04 LAB — HEMOGLOBIN A1C
Hgb A1c MFr Bld: 5.9 % — ABNORMAL HIGH (ref 4.8–5.6)
Mean Plasma Glucose: 122.63 mg/dL

## 2021-03-04 LAB — CREATININE, SERUM
Creatinine, Ser: 1.02 mg/dL — ABNORMAL HIGH (ref 0.44–1.00)
GFR, Estimated: >60 mL/min (ref >60)

## 2021-03-04 SURGERY — LEFT HEART CATH AND CORONARY ANGIOGRAPHY
Anesthesia: Moderate Sedation

## 2021-03-04 MED ORDER — FENTANYL CITRATE (PF) 100 MCG/2ML IJ SOLN
INTRAMUSCULAR | Status: AC
  Filled 2021-03-04: qty 2

## 2021-03-04 MED ORDER — PANTOPRAZOLE SODIUM 40 MG PO TBEC
40.0000 mg | DELAYED_RELEASE_TABLET | Freq: Every day | ORAL | Status: DC
  Administered 2021-03-04 – 2021-03-08 (×5): 40 mg via ORAL
  Filled 2021-03-04 (×5): qty 1

## 2021-03-04 MED ORDER — ONDANSETRON HCL 4 MG/2ML IJ SOLN: 4.0000 mg | Freq: Four times a day (QID) | INTRAMUSCULAR | Status: DC | PRN

## 2021-03-04 MED ORDER — HEPARIN SODIUM (PORCINE) 5000 UNIT/ML IJ SOLN
5000.0000 [IU] | Freq: Three times a day (TID) | INTRAMUSCULAR | Status: AC
  Administered 2021-03-05 – 2021-03-07 (×8): 5000 [IU] via SUBCUTANEOUS
  Filled 2021-03-04 (×8): qty 1

## 2021-03-04 MED ORDER — SODIUM CHLORIDE 0.9 % WEIGHT BASED INFUSION: 3.0000 mL/kg/h | INTRAVENOUS | Status: DC

## 2021-03-04 MED ORDER — SODIUM CHLORIDE 0.9 % WEIGHT BASED INFUSION: 1.0000 mL/kg/h | INTRAVENOUS | Status: DC

## 2021-03-04 MED ORDER — ACETAMINOPHEN 325 MG PO TABS
650.0000 mg | ORAL_TABLET | ORAL | Status: DC | PRN
  Administered 2021-03-08: 325 mg via ORAL
  Filled 2021-03-04: qty 2

## 2021-03-04 MED ORDER — CHLORHEXIDINE GLUCONATE 0.12 % MT SOLN
15.0000 mL | Freq: Two times a day (BID) | OROMUCOSAL | Status: DC
  Administered 2021-03-04 – 2021-03-08 (×7): 15 mL via OROMUCOSAL
  Filled 2021-03-04 (×7): qty 15

## 2021-03-04 MED ORDER — HEPARIN (PORCINE) IN NACL 1000-0.9 UT/500ML-% IV SOLN
INTRAVENOUS | Status: AC
  Filled 2021-03-04: qty 1000

## 2021-03-04 MED ORDER — NITROGLYCERIN 0.4 MG SL SUBL: 0.4000 mg | SUBLINGUAL_TABLET | SUBLINGUAL | Status: DC | PRN

## 2021-03-04 MED ORDER — MIDAZOLAM HCL 2 MG/2ML IJ SOLN
INTRAMUSCULAR | Status: DC | PRN
  Administered 2021-03-04 (×2): 1 mg via INTRAVENOUS

## 2021-03-04 MED ORDER — VERAPAMIL HCL 2.5 MG/ML IV SOLN
INTRAVENOUS | Status: DC | PRN
  Administered 2021-03-04: 2.5 mg via INTRAVENOUS

## 2021-03-04 MED ORDER — HYDROCHLOROTHIAZIDE 12.5 MG PO CAPS
12.5000 mg | ORAL_CAPSULE | Freq: Every day | ORAL | Status: DC
  Administered 2021-03-05: 12.5 mg via ORAL
  Filled 2021-03-04: qty 1

## 2021-03-04 MED ORDER — METOPROLOL SUCCINATE ER 50 MG PO TB24
50.0000 mg | ORAL_TABLET | Freq: Every day | ORAL | Status: DC
  Administered 2021-03-05 – 2021-03-09 (×5): 50 mg via ORAL
  Filled 2021-03-04 (×5): qty 1

## 2021-03-04 MED ORDER — SPIRONOLACTONE 25 MG PO TABS
25.0000 mg | ORAL_TABLET | Freq: Every day | ORAL | Status: DC
  Administered 2021-03-05 – 2021-03-06 (×3): 25 mg via ORAL
  Filled 2021-03-04 (×3): qty 1

## 2021-03-04 MED ORDER — LOSARTAN POTASSIUM 50 MG PO TABS
100.0000 mg | ORAL_TABLET | Freq: Every day | ORAL | Status: DC
  Administered 2021-03-05 – 2021-03-09 (×5): 100 mg via ORAL
  Filled 2021-03-04 (×5): qty 2

## 2021-03-04 MED ORDER — DOCUSATE SODIUM 100 MG PO CAPS: 100.0000 mg | ORAL_CAPSULE | Freq: Two times a day (BID) | ORAL | Status: DC | PRN

## 2021-03-04 MED ORDER — IOHEXOL 300 MG/ML  SOLN
INTRAMUSCULAR | Status: DC | PRN
  Administered 2021-03-04 (×2): 35 mL

## 2021-03-04 MED ORDER — ASPIRIN 81 MG PO CHEW
81.0000 mg | CHEWABLE_TABLET | ORAL | Status: AC
  Administered 2021-03-04: 81 mg via ORAL
  Filled 2021-03-04: qty 1

## 2021-03-04 MED ORDER — HEPARIN SODIUM (PORCINE) 1000 UNIT/ML IJ SOLN
INTRAMUSCULAR | Status: AC
  Filled 2021-03-04: qty 1

## 2021-03-04 MED ORDER — HEPARIN (PORCINE) IN NACL 1000-0.9 UT/500ML-% IV SOLN
INTRAVENOUS | Status: DC | PRN
  Administered 2021-03-04 (×2): 500 mL

## 2021-03-04 MED ORDER — ASPIRIN 81 MG PO CHEW: 81.0000 mg | CHEWABLE_TABLET | ORAL | Status: DC

## 2021-03-04 MED ORDER — TRAZODONE HCL 50 MG PO TABS: 25.0000 mg | ORAL_TABLET | Freq: Every evening | ORAL | Status: DC | PRN

## 2021-03-04 MED ORDER — ATORVASTATIN CALCIUM 40 MG PO TABS
40.0000 mg | ORAL_TABLET | Freq: Every day | ORAL | Status: DC
  Administered 2021-03-05 – 2021-03-08 (×4): 40 mg via ORAL
  Filled 2021-03-04 (×4): qty 1

## 2021-03-04 MED ORDER — SODIUM CHLORIDE 0.9 % IV SOLN: 250.0000 mL | INTRAVENOUS | Status: DC | PRN

## 2021-03-04 MED ORDER — CLONAZEPAM 0.25 MG PO TBDP: 0.2500 mg | ORAL_TABLET | Freq: Two times a day (BID) | ORAL | Status: DC | PRN

## 2021-03-04 MED ORDER — HEPARIN SODIUM (PORCINE) 1000 UNIT/ML IJ SOLN
INTRAMUSCULAR | Status: DC | PRN
  Administered 2021-03-04: 5000 [IU] via INTRAVENOUS

## 2021-03-04 MED ORDER — MIDAZOLAM HCL 2 MG/2ML IJ SOLN
INTRAMUSCULAR | Status: AC
  Filled 2021-03-04: qty 2

## 2021-03-04 MED ORDER — FENTANYL CITRATE (PF) 100 MCG/2ML IJ SOLN
INTRAMUSCULAR | Status: DC | PRN
  Administered 2021-03-04 (×2): 25 ug via INTRAVENOUS

## 2021-03-04 MED ORDER — SODIUM CHLORIDE 0.9% FLUSH: 3.0000 mL | INTRAVENOUS | Status: DC | PRN

## 2021-03-04 MED ORDER — VERAPAMIL HCL 2.5 MG/ML IV SOLN
INTRAVENOUS | Status: AC
  Filled 2021-03-04: qty 2

## 2021-03-04 MED ORDER — ASPIRIN EC 81 MG PO TBEC
81.0000 mg | DELAYED_RELEASE_TABLET | Freq: Every day | ORAL | Status: DC
  Administered 2021-03-05 – 2021-03-09 (×5): 81 mg via ORAL
  Filled 2021-03-04 (×5): qty 1

## 2021-03-04 SURGICAL SUPPLY — 11 items
CATH INFINITI 5FR JK (CATHETERS) ×1 IMPLANT
DEVICE RAD TR BAND REGULAR (VASCULAR PRODUCTS) ×1 IMPLANT
DRAPE BRACHIAL (DRAPES) ×1 IMPLANT
GLIDESHEATH SLEND SS 6F .021 (SHEATH) ×1 IMPLANT
GUIDEWIRE INQWIRE 1.5J.035X260 (WIRE) IMPLANT
INQWIRE 1.5J .035X260CM (WIRE) ×2
PACK CARDIAC CATH (CUSTOM PROCEDURE TRAY) ×2 IMPLANT
PANNUS RETENTION SYSTEM 2 PAD (MISCELLANEOUS) ×1 IMPLANT
PROTECTION STATION PRESSURIZED (MISCELLANEOUS) ×2
SET ATX SIMPLICITY (MISCELLANEOUS) ×1 IMPLANT
STATION PROTECTION PRESSURIZED (MISCELLANEOUS) IMPLANT

## 2021-03-04 NOTE — Discharge Summary (Signed)
Physician Discharge Summary  Julie James KCL:[id number] DOB: February 15, 1951 DOA: 02/27/2021  PCP: Verl Bangs, FNP  Admit date: 02/27/2021 Discharge date: 03/04/2021  Discharge disposition: Transfer to [facility]   Recommendations for Outpatient Follow-Up:   Outpatient follow-up with PCP and cardiologist   Discharge Diagnosis:   Active Problems:   Cardiac arrest [facility])    Discharge Condition: Stable.  Diet recommendation:  Diet Order            Diet NPO time specified Except for: Sips with Meds  Diet effective now           Diet - low sodium heart healthy                   Code Status: Prior     Hospital Course:   Julie James is a 70 y.o. female with medical history significant for hypertension, hypercholesterolemia, obesity, who was brought to the hospital because of cardiac arrest.  Apparently, she was in her usual state of health and was getting into her house when she fell backwards became unresponsive. Family called 63 and started CPR.  Reportedly, patient had a shockable rhythm when EMS arrived so she was shocked x2 by the ED. Patient was brought to the hospital for further management.  She was intubated and placed on mechanical ventilation in the emergency room because of unresponsiveness.  She was subsequently admitted to the ICU where she was placed on hypothermia protocol and required vasopressors.  She was seen in consultation by the cardiologist and she underwent left heart catheterization which showed normal coronaries.  2D echo showed normal EF estimated at 55 to 60%, mild LVH and grade 1 diastolic dysfunction.  Cardiologist recommended transfer to [facility] for further management by the electrophysiologist.  I was informed by Christell Faith, PA, with cardiology group that arrangement has already been made for transfer to [facility] and accepting physician is Dr. Gasper Sells.  Plan was discussed with and her  daughters.    Medical Consultants:    Cardiologist, electrophysiologist   Discharge Exam:    Vitals:   03/04/21 1245 03/04/21 1300 03/04/21 1315 03/04/21 1330  BP: (!) 146/68 133/72  131/71  Pulse: 71 64 67 73  Resp: 20 (!) 22 (!) 22 20  Temp:      TempSrc:      SpO2: 97% 97% 98% 97%  Weight:      Height:         GEN: NAD SKIN: Skin tag on left upper back EYES: EOMI ENT: MMM CV: RRR PULM: CTA B ABD: soft, obese, NT, +BS CNS: AAO x 3, non focal EXT: No edema or tenderness   The results of significant diagnostics from this hospitalization (including imaging, microbiology, ancillary and laboratory) are listed below for reference.     Procedures and Diagnostic Studies:   MR BRAIN WO CONTRAST  Result Date: 03/02/2021 CLINICAL DATA:  Anoxic brain damage.  Status post cardiac arrest. EXAM: MRI HEAD WITHOUT CONTRAST TECHNIQUE: Multiplanar, multiecho pulse sequences of the brain and surrounding structures were obtained without intravenous contrast. COMPARISON:  Head CT 08/07/2019 FINDINGS: Brain: There is no evidence of an acute infarct, intracranial hemorrhage, mass, midline shift, or extra-axial fluid collection. No cerebral edema is seen to indicate global hypoxic-ischemic injury. The ventricles and sulci are normal. There is a markedly enlarged empty sella which is unchanged. Vascular: Major intracranial vascular flow voids are preserved. Skull and upper cervical spine: Unremarkable bone marrow signal.  Sinuses/Orbits: Unremarkable orbits. Paranasal sinuses and mastoid air cells are clear. Other: None. IMPRESSION: 1. No acute intracranial abnormality. 2. Chronic empty sella. Electronically Signed   By: Logan Bores M.D.   On: 03/02/2021 14:00   ECHOCARDIOGRAM COMPLETE  Result Date: 03/02/2021    ECHOCARDIOGRAM REPORT   Patient Name:   Julie James Date of Exam: 03/02/2021 Medical Rec #:  [id number]          Height:       66.0 in Accession #:    [id number]         Weight:        264.6 lb Date of Birth:  01/01/1951          BSA:          2.252 m Patient Age:    63 years           BP:           164/66 mmHg Patient Gender: F                  HR:           85 bpm. Exam Location:  [facility] Procedure: 2D Echo, Color Doppler, Cardiac Doppler and Intracardiac            Opacification Agent Indications:     I46.9 Cardiac arrest  History:         Patient has no prior history of Echocardiogram examinations.                  Risk Factors:Hypertension and HCL.  Sonographer:     Charmayne Sheer RDCS (AE) Referring Phys:  [id number] Tyler Pita Diagnosing Phys: Kathlyn Sacramento MD  Sonographer Comments: Suboptimal apical window and suboptimal subcostal window. Image acquisition challenging due to patient body habitus. IMPRESSIONS  1. Left ventricular ejection fraction, by estimation, is 55 to 60%. The left ventricle has normal function. The left ventricle has no regional wall motion abnormalities. There is mild left ventricular hypertrophy. Left ventricular diastolic parameters are consistent with Grade I diastolic dysfunction (impaired relaxation).  2. Right ventricular systolic function is normal. The right ventricular size is normal. Tricuspid regurgitation signal is inadequate for assessing PA pressure.  3. The mitral valve is normal in structure. No evidence of mitral valve regurgitation. No evidence of mitral stenosis.  4. The aortic valve is normal in structure. Aortic valve regurgitation is not visualized. Mild aortic valve sclerosis is present, with no evidence of aortic valve stenosis.  5. The inferior vena cava is normal in size with greater than 50% respiratory variability, suggesting right atrial pressure of 3 mmHg.  6. Challenging image quality. FINDINGS  Left Ventricle: Left ventricular ejection fraction, by estimation, is 55 to 60%. The left ventricle has normal function. The left ventricle has no regional wall motion abnormalities. Definity contrast agent was given IV to delineate the left  ventricular  endocardial borders. The left ventricular internal cavity size was normal in size. There is mild left ventricular hypertrophy. Left ventricular diastolic parameters are consistent with Grade I diastolic dysfunction (impaired relaxation). Right Ventricle: The right ventricular size is normal. No increase in right ventricular wall thickness. Right ventricular systolic function is normal. Tricuspid regurgitation signal is inadequate for assessing PA pressure. Left Atrium: Left atrial size was normal in size. Right Atrium: Right atrial size was normal in size. Pericardium: There is no evidence of pericardial effusion. Mitral Valve: The mitral valve is normal in structure. No evidence of mitral valve  regurgitation. No evidence of mitral valve stenosis. MV peak gradient, 13.5 mmHg. The mean mitral valve gradient is 6.0 mmHg. Tricuspid Valve: The tricuspid valve is normal in structure. Tricuspid valve regurgitation is trivial. No evidence of tricuspid stenosis. Aortic Valve: The aortic valve is normal in structure. Aortic valve regurgitation is not visualized. Mild aortic valve sclerosis is present, with no evidence of aortic valve stenosis. Aortic valve mean gradient measures 9.0 mmHg. Aortic valve peak gradient measures 15.7 mmHg. Aortic valve area, by VTI measures 2.50 cm. Pulmonic Valve: The pulmonic valve was normal in structure. Pulmonic valve regurgitation is not visualized. No evidence of pulmonic stenosis. Aorta: The aortic root is normal in size and structure. Venous: The inferior vena cava is normal in size with greater than 50% respiratory variability, suggesting right atrial pressure of 3 mmHg. IAS/Shunts: No atrial level shunt detected by color flow Doppler.  LEFT VENTRICLE PLAX 2D LVIDd:         4.50 cm  Diastology LVIDs:         3.10 cm  LV e' medial:    8.38 cm/s LV PW:         1.10 cm  LV E/e' medial:  11.3 LV IVS:        1.10 cm  LV e' lateral:   8.27 cm/s LVOT diam:     2.20 cm  LV E/e'  lateral: 11.4 LV SV:         93 LV SV Index:   41 LVOT Area:     3.80 cm  LEFT ATRIUM         Index LA diam:    3.40 cm 1.51 cm/m  AORTIC VALVE                    PULMONIC VALVE AV Area (Vmax):    2.42 cm     PV Vmax:       1.49 m/s AV Area (Vmean):   2.38 cm     PV Vmean:      103.000 cm/s AV Area (VTI):     2.50 cm     PV VTI:        0.301 m AV Vmax:           198.00 cm/s  PV Peak grad:  8.9 mmHg AV Vmean:          138.000 cm/s PV Mean grad:  5.0 mmHg AV VTI:            0.373 m AV Peak Grad:      15.7 mmHg AV Mean Grad:      9.0 mmHg LVOT Vmax:         126.00 cm/s LVOT Vmean:        86.500 cm/s LVOT VTI:          0.245 m LVOT/AV VTI ratio: 0.66  AORTA Ao Root diam: 3.00 cm MITRAL VALVE MV Area (PHT): 5.16 cm     SHUNTS MV Area VTI:   3.58 cm     Systemic VTI:  0.24 m MV Peak grad:  13.5 mmHg    Systemic Diam: 2.20 cm MV Mean grad:  6.0 mmHg MV Vmax:       1.84 m/s MV Vmean:      110.0 cm/s MV Decel Time: 147 msec MV E velocity: 94.30 cm/s MV A velocity: 169.00 cm/s MV E/A ratio:  0.56 Kathlyn Sacramento MD Electronically signed by Kathlyn Sacramento MD Signature Date/Time: 03/02/2021/2:08:02 PM  Final      Labs:   Basic Metabolic Panel: Recent Labs  Lab 02/27/21 1539 02/27/21 1758 02/27/21 2346 02/28/21 0454 03/01/21 0204 03/02/21 0605 03/03/21 0531 03/04/21 0504  NA 139  --  136 134* 133* 137 140 139  K 2.7*  --  3.7 3.7 4.6 4.4 4.1 3.8  CL 103  --  101 103 105 106 104 100  CO2 23  --  25 21* 23 26 28 29   GLUCOSE 196*  --  198* 155* 127* 106* 105* 109*  BUN 13  --  17 19 13 11 13 14   CREATININE 0.94 0.84 1.07* 1.44* 0.74 0.74 0.84 0.94  CALCIUM 9.0  --  8.5* 8.4* 8.6* 9.0 9.5 9.4  MG 2.1 2.1 2.0 1.7 1.9  --   --   --   PHOS 5.1* 4.4 3.5 4.6 2.9  --   --   --    GFR Estimated Creatinine Clearance: 73.7 mL/min (by C-G formula based on SCr of 0.94 mg/dL). Liver Function Tests: Recent Labs  Lab 02/28/21 0454 03/01/21 0204 03/02/21 0605 03/03/21 0531 03/04/21 0504  AST 117* 65*  43* 35 28  ALT 146* 110* 81* 59* 46*  ALKPHOS 65 64 66 66 64  BILITOT 0.4 0.6 0.8 0.8 0.8  PROT 6.7 6.3* 6.6 7.1 7.5  ALBUMIN 3.6 3.3* 3.4* 3.6 3.8   No results for input(s): LIPASE, AMYLASE in the last 168 hours. No results for input(s): AMMONIA in the last 168 hours. Coagulation profile Recent Labs  Lab 02/27/21 1758 02/27/21 2346  INR 1.0 1.1    CBC: Recent Labs  Lab 02/27/21 1539 02/27/21 1758 02/28/21 0454 03/01/21 0204 03/02/21 0605 03/03/21 0531 03/04/21 0504  WBC 7.9   < > 7.8 7.8 9.4 8.0 7.4  NEUTROABS 2.9  --   --  5.8 7.6 5.8 4.8  HGB 12.2   < > 10.8* 9.9* 9.9* 11.3* 11.7*  HCT 38.2   < > 33.3* 29.9* 30.8* 34.3* 35.9*  MCV 89.5   < > 87.4 87.4 88.8 88.2 87.8  PLT 349   < > 312 275 264 301 322   < > = values in this interval not displayed.   Cardiac Enzymes: No results for input(s): CKTOTAL, CKMB, CKMBINDEX, TROPONINI in the last 168 hours. BNP: Invalid input(s): POCBNP CBG: Recent Labs  Lab 03/01/21 0751 03/01/21 1112 03/01/21 1527 03/01/21 2343 03/02/21 0347  GLUCAP 97 112* 96 114* 98   D-Dimer No results for input(s): DDIMER in the last 72 hours. Hgb A1c No results for input(s): HGBA1C in the last 72 hours. Lipid Profile No results for input(s): CHOL, HDL, LDLCALC, TRIG, CHOLHDL, LDLDIRECT in the last 72 hours. Thyroid function studies No results for input(s): TSH, T4TOTAL, T3FREE, THYROIDAB in the last 72 hours.  Invalid input(s): FREET3 Anemia work up No results for input(s): VITAMINB12, FOLATE, FERRITIN, TIBC, IRON, RETICCTPCT in the last 72 hours. Microbiology Recent Results (from the past 240 hour(s))  Resp Panel by RT-PCR (Flu A&B, Covid) Nasopharyngeal Swab     Status: None   Collection Time: 02/27/21  3:39 PM   Specimen: Nasopharyngeal Swab; Nasopharyngeal(NP) swabs in vial transport medium  Result Value Ref Range Status   SARS Coronavirus 2 by RT PCR NEGATIVE NEGATIVE Final    Comment: (NOTE) SARS-CoV-2 target nucleic acids  are NOT DETECTED.  The SARS-CoV-2 RNA is generally detectable in upper respiratory specimens during the acute phase of infection. The lowest concentration of SARS-CoV-2 viral copies this assay  can detect is 138 copies/mL. A negative result does not preclude SARS-Cov-2 infection and should not be used as the sole basis for treatment or other patient management decisions. A negative result may occur with  improper specimen collection/handling, submission of specimen other than nasopharyngeal swab, presence of viral mutation(s) within the areas targeted by this assay, and inadequate number of viral copies(<138 copies/mL). A negative result must be combined with clinical observations, patient history, and epidemiological information. The expected result is Negative.  Fact Sheet for Patients:  EntrepreneurPulse.com.au  Fact Sheet for Healthcare Providers:  IncredibleEmployment.be  This test is no t yet approved or cleared by the [place] FDA and  has been authorized for detection and/or diagnosis of SARS-CoV-2 by FDA under an Emergency Use Authorization (EUA). This EUA will remain  in effect (meaning this test can be used) for the duration of the COVID-19 declaration under Section 564(b)(1) of the Act, 21 U.S.C.section 360bbb-3(b)(1), unless the authorization is terminated  or revoked sooner.       Influenza A by PCR NEGATIVE NEGATIVE Final   Influenza B by PCR NEGATIVE NEGATIVE Final    Comment: (NOTE) The Xpert Xpress SARS-CoV-2/FLU/RSV plus assay is intended as an aid in the diagnosis of influenza from Nasopharyngeal swab specimens and should not be used as a sole basis for treatment. Nasal washings and aspirates are unacceptable for Xpert Xpress SARS-CoV-2/FLU/RSV testing.  Fact Sheet for Patients: EntrepreneurPulse.com.au  Fact Sheet for Healthcare Providers: IncredibleEmployment.be  This test is  not yet approved or cleared by the [place] FDA and has been authorized for detection and/or diagnosis of SARS-CoV-2 by FDA under an Emergency Use Authorization (EUA). This EUA will remain in effect (meaning this test can be used) for the duration of the COVID-19 declaration under Section 564(b)(1) of the Act, 21 U.S.C. section 360bbb-3(b)(1), unless the authorization is terminated or revoked.  Performed at [facility], [address] [place], [place] [address]   MRSA PCR Screening     Status: None   Collection Time: 02/27/21  5:30 PM   Specimen: Nasopharyngeal  Result Value Ref Range Status   MRSA by PCR NEGATIVE NEGATIVE Final    Comment:        The GeneXpert MRSA Assay (FDA approved for NASAL specimens only), is one component of a comprehensive MRSA colonization surveillance program. It is not intended to diagnose MRSA infection nor to guide or monitor treatment for MRSA infections. Performed at [facility], [address] [place], [place] [address]      Discharge Instructions:   Discharge Instructions    Diet - low sodium heart healthy   Complete by: As directed    Discharge wound care:   Complete by: As directed    Keep wound covered with dressing   Increase activity slowly   Complete by: As directed      Allergies as of 03/04/2021      Reactions   Chlorthalidone Other (See Comments)   hypokalemia      Medication List    TAKE these medications   amLODipine 5 MG tablet Commonly known as: NORVASC Take 1 tablet (5 mg total) by mouth daily.   losartan-hydrochlorothiazide 100-12.5 MG tablet Commonly known as: HYZAAR Take 1 tablet by mouth daily.   metoprolol succinate 50 MG 24 hr tablet Commonly known as: TOPROL-XL Take 1 tablet (50 mg total) by mouth daily. Take with or immediately following a meal.   simvastatin 40 MG tablet Commonly known as: ZOCOR Take  1 tablet (40 mg total) by mouth at bedtime.             Discharge Care Instructions  (From admission, onward)         Start     Ordered   03/04/21 0000  Discharge wound care:       Comments: Keep wound covered with dressing   03/04/21 1446            Time coordinating discharge: 32 minutes  Signed:  Codee Tutson  [facility] 03/04/2021, 2:47 PM   Pager on [email or web address]. If 7PM-7AM, please contact night-coverage at [email or web address]

## 2021-03-04 NOTE — Progress Notes (Signed)
SLP Cancellation Note  Patient Details Name: Aolanis Crispen MRN: [id number] DOB: 10/18/1951   Cancelled treatment:       Reason Eval/Treat Not Completed: Other (comment)  Pt pending transfer to [facility] for higher level of care. Will defer cognitive linguistic evaluation.   Graciana Sessa B. Rutherford Nail M.S., CCC-SLP, [place] Office [phone]  Stormy Fabian 03/04/2021, 8:22 PM

## 2021-03-04 NOTE — Progress Notes (Signed)
Daily Progress Note   Patient Name: Julie James       Date: 03/04/2021 DOB: Aug 19, 1951  Age: 70 y.o. MRN#: [id number] Attending Physician: Jennye Boroughs, MD Primary Care Physician: Verl Bangs, FNP Admit Date: 02/27/2021  Reason for Consultation/Follow-up: Establishing goals of care  Subjective: Patient sitting on side of bed. She states she is so happy her cardiac cath this morning came back without blockages. She discusses being transferred to [facility] for a defibrillator, and then going home. She is doing well.    Length of Stay: 5  Current Medications: Scheduled Meds:  . chlorhexidine  15 mL Mouth Rinse BID  . feeding supplement  237 mL Oral BID BM  . losartan  100 mg Oral Daily   And  . hydrochlorothiazide  12.5 mg Oral Daily  . mouth rinse  15 mL Mouth Rinse q12n4p  . metoprolol succinate  50 mg Oral Daily  . pantoprazole (PROTONIX) IV  40 mg Intravenous QHS  . sodium chloride flush  3 mL Intravenous Q12H    Continuous Infusions: . sodium chloride      PRN Meds: sodium chloride, acetaminophen, albuterol, clonazepam, docusate sodium, hydrALAZINE, ondansetron (ZOFRAN) IV, polyethylene glycol, sodium chloride flush, traZODone  Physical Exam Pulmonary:     Effort: Pulmonary effort is normal.  Neurological:     Mental Status: She is alert.             Vital Signs: BP 133/65   Pulse 90   Temp 97.8 F (36.6 C) (Oral)   Resp 20   Ht 5' 5.98" (1.676 m)   Wt 110.7 kg   SpO2 97%   BMI 39.42 kg/m  SpO2: SpO2: 97 % O2 Device: O2 Device: Room Air O2 Flow Rate: O2 Flow Rate (L/min): 2 L/min  Intake/output summary: No intake or output data in the 24 hours ending 03/04/21 1604 LBM: Last BM Date: 02/27/21 Baseline Weight: Weight: 118.1 kg Most recent weight:  Weight: 110.7 kg   Flowsheet Rows   Flowsheet Row Most Recent Value  Intake Tab   Referral Department Critical care  Unit at Time of Referral ICU  Date Notified 02/28/21  Palliative Care Type New Palliative care  Reason for referral Clarify Goals of Care  Date of Admission 02/27/21  Date first seen by Palliative Care 02/28/21  #  of days Palliative referral response time 0 Day(s)  # of days IP prior to Palliative referral 1  Clinical Assessment   Psychosocial & Spiritual Assessment   Palliative Care Outcomes       Patient Active Problem List   Diagnosis Date Noted  . Hypokalemia 03/04/2021  . Cardiac arrest ([place]) 02/27/2021  . GERD (gastroesophageal reflux disease) 08/23/2020  . Needs flu shot 08/23/2020  . Breast cancer screening by mammogram 03/12/2020  . Screening for malignant neoplasm of colon 03/12/2020  . Drug-induced hypokalemia 08/07/2019  . Lipoma of abdomen 07/30/2017  . Lipoma of right thigh 07/30/2017  . Hypertension 06/23/2016  . Hyperlipidemia 06/23/2016  . Thyroid nodule 03/16/2014    Palliative Care Assessment & Plan    Recommendations/Plan: Transferring to [facility]. Full code/full scope. PMT will sign off at this time.    Code Status: Code Status History    Date Active Date Inactive Code Status Order ID Comments User Context   02/28/2021 0159 03/01/2021 1106 Full Code [id number]  Rust-ChesterHuel Cote, NP Inpatient   02/27/2021 3875 02/28/2021 0159 Full Code [id number]  Tyler Pita, MD ED   02/27/2021 1625 02/27/2021 1634 Full Code [id number]  Tyler Pita, MD ED   08/07/2019 0931 08/08/2019 1822 Full Code [id number]  Lang Snow, NP ED   Advance Care Planning Activity    Questions for Most Recent Historical Code Status (Order [id number])        Thank you for allowing the Palliative Medicine Team to assist in the care of this patient.   Total Time 15 min Prolonged Time Billed no       Greater than 50%  of this time was spent  counseling and coordinating care related to the above assessment and plan.  Asencion Gowda, NP  Please contact Palliative Medicine Team phone at [phone] for questions and concerns.

## 2021-03-04 NOTE — Progress Notes (Signed)
OT Cancellation Note  Patient Details Name: Aiva Miskell MRN: [id number] DOB: 1951-06-14   Cancelled Treatment:    Reason Eval/Treat Not Completed: Patient at procedure or test/ unavailable  Pt OTF at this time to heart cath and angiography. Will f/u at later date/time as able/appropriate for OT treatment. Thank you.  Gerrianne Scale, MS, OTR/L ascom [phone] 03/04/21, 1:19 PM

## 2021-03-04 NOTE — H&P (Signed)
Cardiology Admission History and Physical:   Patient ID: Julie James MRN: [id number]; DOB: 05-25-1951   Admission date: 03/04/2021  PCP:  Verl Bangs, [place]  Cardiologist:  Ida Rogue, MD  Electrophysiologist:  Virl Axe, MD    Chief Complaint: Out-of-hospital cardiac arrest  Patient Profile:   Julie James is a 70 y.o. female with HTN, HLD, bradycardia, and obesity, who was admitted to [facility] on 02/27/2021 with an out of hospital witnessed cardiac arrest, new LBBB, WCT, elevated troponin, acute hypoxic respiratory failure, and acute metabolic encephalopathy and underwent LHC on 03/04/2021 which showed normal coronary arteries who has been transferred to [facility] for further electrophysiology work-up.  History of Present Illness:   [name] was admitted 02/27/21, with witnessed and aborted cardiac arrest s/p immediate CPR. 911 was called and onfire/rescuearrival, patient was reportedly in a shockable rhythm. She received 2 shocks. On EMS arrival, she had a pulse and was in what appeared to be a normal sinus rhythm. Code STEMI was activated due to concern for anterior precordial lead elevations. En route, she was minimally responsive.  Shearrived to the [employer] pulses, unresponsive.Code STEMI was cancelled by Dr. Lance Sell. She was intubated in the emergency room due to being unresponsive. In the ED, she developed WCT and was started on amiodarone which was subsequently stopped due to bradycardia.  She was consulted on by Dr. Clayborn Bigness with recommendation for echo. He did not recommend cardiac cath at that time as there was concern for possible anoxic brain injury. HS-Tn initially 13 with a delta of 105, ultimately peaking at 502 and down trending. Echo on 03/02/2021 showed an EF of 55-60%, no RWMA, mild LVH, Gr1DD, normal RVSF and ventricular cavity size, and mild aortic valve sclerosis without evidence of stenosis. MRI brain  showed no acute intracranial process. She was treated with IV heparin for 48 hours. Initial potassium was low at 2.7, which was repleted. Magnesium 2.0.  As outlined above, there were initial concerns for anoxic brain injury, though she has subsequently had return of near normal cognitive function.   [facility] EP was asked to evaluate the patient at the request of Dr. Manuella Ghazi on 3/31. Dr. Caryl Comes evaluated her on 3/31 with recommendation to proceed with cardiac cath followed by transfer to [facility] for cMRI and ICD. Her hypokalemia was not felt to be a contributing factor to her arrest, as this was 4.1 five days prior to her arrest, and was felt to be a consequence of her cardiac arrest. Initially, she was not inclined to pursue any cardiac workup, though after she had discussion with her family, she wanted to move forward with cardiac testing and treatment. There is no family history of sudden cardiac death.   This morning, she is feeling well and is without chest pain, dyspnea, palpitations, dizziness, presyncope, or syncope.  She underwent LHC on 4/1 which showed normal coronary arteries with normal LV systolic function and LVEDP.  There was no ischemic etiology for cardiac arrest with recommendation to continue evaluation by electrophysiology.   Past Medical History:  Diagnosis Date  . Cardiac arrest ([place]) 02/27/2021   a.  Witnessed out of hospital cardiac arrest s/p shock x2; b.  LHC 03/04/2021 w/ normal coronary arteries; c. TTE 03/02/21 - EF 55-60%, no RWMA, mild LVH, Gr1DD, nl RVSF and ventricular cavity size, and mild aortic valve sclerosis w/o evidence of stenosis  . Hypercholesteremia   . Hypertension     No past  surgical history on file.   Medications Prior to Admission: Prior to Admission medications   Medication Sig Start Date End Date Taking? Authorizing Provider  amLODipine (NORVASC) 5 MG tablet Take 1 tablet (5 mg total) by mouth daily. 05/21/20   Malfi, Lupita Raider, FNP   losartan-hydrochlorothiazide (HYZAAR) 100-12.5 MG tablet Take 1 tablet by mouth daily. 02/22/21   Kathrine Haddock, NP  metoprolol succinate (TOPROL-XL) 50 MG 24 hr tablet Take 1 tablet (50 mg total) by mouth daily. Take with or immediately following a meal. 11/25/20   Malfi, Lupita Raider, FNP  simvastatin (ZOCOR) 40 MG tablet Take 1 tablet (40 mg total) by mouth at bedtime. 05/21/20   Malfi, Lupita Raider, FNP  losartan (COZAAR) 50 MG tablet Take 1.5 tablets (75 mg total) by mouth daily. 08/23/20 02/22/21  Verl Bangs, FNP     Allergies:    Allergies  Allergen Reactions  . Chlorthalidone Other (See Comments)    hypokalemia    Social History:   Social History   Socioeconomic History  . Marital status: Married    Spouse name: Not on file  . Number of children: Not on file  . Years of education: Not on file  . Highest education level: Not on file  Occupational History  . Not on file  Tobacco Use  . Smoking status: Never Smoker  . Smokeless tobacco: Never Used  Vaping Use  . Vaping Use: Never used  Substance and Sexual Activity  . Alcohol use: No  . Drug use: No  . Sexual activity: Not on file  Other Topics Concern  . Not on file  Social History Narrative  . Not on file   Social Determinants of Health   Financial Resource Strain: Not on file  Food Insecurity: Not on file  Transportation Needs: Not on file  Physical Activity: Not on file  Stress: Not on file  Social Connections: Not on file  Intimate Partner Violence: Not on file    Family History:   The patient's family history includes Cancer in her sister and sister.    ROS:  Please see the history of present illness.  All other ROS reviewed and negative.     Physical Exam/Data:   Vitals:   03/03/21 2012 03/04/21 0525 03/04/21 0842 03/04/21 1100  BP: (!) 161/78 (!) 161/79 (!) 157/71 (!) 164/74  Pulse: 76 72 70 70  Resp: 19 18 18 20   Temp: 99.6 F (37.6 C) 98.6 F (37 C) 99.8 F (37.7 C) 97.8 F (36.6 C)   TempSrc: Oral  Oral Oral  SpO2: 94% 96% 97% 99%  Weight:      Height:        Intake/Output Summary (Last 24 hours) at 03/04/2021 1106 Last data filed at 03/03/2021 1435    Gross per 24 hour  Intake 360 ml  Output --  Net 360 ml        Filed Weights   02/27/21 1745 03/01/21 0500 03/03/21 0457  Weight: 118.1 kg 120 kg 112 kg    General:  Well nourished, well developed, in no acute distress HEENT: Normal Lymph: No adenopathy Neck: No JVD Endocrine:  No thryomegaly Vascular: No carotid bruits; FA pulses 2+ bilaterally without bruits  Cardiac:  Normal S1, S2; RRR; I/VI systolic murmur RUSB, no rubs or gallops Lungs:  Clear to auscultation bilaterally, no wheezing, rhonchi or rales  Abd: Soft, nontender, no hepatomegaly  Ext: No edema Musculoskeletal:  No deformities, BUE and BLE strength normal and equal  Skin: Warm and dry  Neuro:  CNs 2-12 intact, no focal abnormalities noted Psych:  Normal affect    EKG:  The ECG that was done 02/27/2021 was personally reviewed and demonstrates NSR, 71 bpm, LBBB (new, prior EKG in 10/2020 with IVCD)  Relevant CV Studies:  2D echo 03/02/2021: 1. Left ventricular ejection fraction, by estimation, is 55 to 60%. The  left ventricle has normal function. The left ventricle has no regional  wall motion abnormalities. There is mild left ventricular hypertrophy.  Left ventricular diastolic parameters  are consistent with Grade I diastolic dysfunction (impaired relaxation).  2. Right ventricular systolic function is normal. The right ventricular  size is normal. Tricuspid regurgitation signal is inadequate for assessing  PA pressure.  3. The mitral valve is normal in structure. No evidence of mitral valve  regurgitation. No evidence of mitral stenosis.  4. The aortic valve is normal in structure. Aortic valve regurgitation is  not visualized. Mild aortic valve sclerosis is present, with no evidence  of aortic valve stenosis.  5.  The inferior vena cava is normal in size with greater than 50%  respiratory variability, suggesting right atrial pressure of 3 mmHg.  6. Challenging image quality. __________  LHC 03/04/2021:  The left ventricular systolic function is normal.  LV end diastolic pressure is normal.  The left ventricular ejection fraction is 55-65% by visual estimate.   1.  Normal coronary arteries. 2.  Normal LV systolic function and left ventricular end-diastolic pressure.  Wall motion could not be fully evaluated.  Recommendations: No ischemic etiology for cardiac arrest. Continue evaluation by electrophysiology.   Laboratory Data:  High Sensitivity Troponin:   Recent Labs  Lab 02/27/21 1539 02/27/21 1758 02/28/21 0454 02/28/21 0720  TROPONINIHS 13 105* 502* 408*      Chemistry Recent Labs  Lab 03/03/21 0531 03/04/21 0504  NA 140 139  K 4.1 3.8  CL 104 100  CO2 28 29  GLUCOSE 105* 109*  BUN 13 14  CREATININE 0.84 0.94  CALCIUM 9.5 9.4  GFRNONAA >60 >60  ANIONGAP 8 10    Recent Labs  Lab 03/03/21 0531 03/04/21 0504  PROT 7.1 7.5  ALBUMIN 3.6 3.8  AST 35 28  ALT 59* 46*  ALKPHOS 66 64  BILITOT 0.8 0.8   Hematology Recent Labs  Lab 03/03/21 0531 03/04/21 0504  WBC 8.0 7.4  RBC 3.89 4.09  HGB 11.3* 11.7*  HCT 34.3* 35.9*  MCV 88.2 87.8  MCH 29.0 28.6  MCHC 32.9 32.6  RDW 14.4 14.0  PLT 301 322   BNP Recent Labs  Lab 02/27/21 1539 02/27/21 1758  BNP 176.4* 122.2*    DDimer No results for input(s): DDIMER in the last 168 hours.   Radiology/Studies:  CXR 02/27/2021: IMPRESSION: Subsegmental atelectasis LEFT lower lobe. __________  Abdominal plain film 02/27/2021: IMPRESSION: NG tube with tip overlying the mid stomach. __________  CXR 02/27/2021: IMPRESSION: Endotracheal tube tip at the level of the clavicular heads. __________  CXR 02/28/2021:  IMPRESSION: 1. No acute cardiopulmonary findings. 2. Stable support apparatus. __________  CXR  03/01/2021: IMPRESSION: No acute abnormality noted.  Tubes and lines as described. __________  MR BRAIN WO CONTRAST  Result Date: 03/02/2021 IMPRESSION: 1. No acute intracranial abnormality. 2. Chronic empty sella. Electronically Signed   By: Logan Bores M.D.   On: 03/02/2021 14:00   Assessment and Plan:   1. Out of hospital witnessed cardiac arrest/new LBBB/WCT/elevated troponin/acute hypoxic respiratory failure/acute metabolic encephalopathy: -Initially evaluated by  [facility] Cardiology with code STEMI being cancelled -HS-Tn peaked at 502, possibly in the setting of CPR and shock x 2 -Echo with preserved LVSF and normal wall motion -Status post IV heparin x 48 hours -Status post extubation on 3/29, now on room air -IV amiodarone held for bradycardia, no further significant ventricular ectopy on tele -Cognition near baseline with MRI brain negative for CVA -Upon [facility] assuming care on 3/31, she was initially not inclined to proceed with cardiac testing and treatment, though after she spoke with her family, she wanted to pursue all options -No driving x 6 months -Chest pain free -Continue Toprol XL -LHC 03/04/2021 with normal coronary arteries with no ischemic etiology of her cardiac arrest identified -Continue evaluation by electrophysiology -Transfer to [facility] for cardiac MRI followed by subsequent ICD implantation to occur prior to formal discharge  2. AKI: -Resolved with IV hydration   3. Hypokalemia: -Repleted -Maintain goal potassium 4.0  4. Anemia: -No obvious source of bleed -Likely dilutional  -Improving   5. Hyperglycemia: -Possibly reactive in the setting of the above -A1c pending   6. HTN: -BP on the high side this morning -Continue HCTZ, losartan, and Toprol for now -Escalate medications as indicated post cath  7. HLD: -LDL 106 this admission, if she is found to have significant CAD, would have a goal LDL of < 70 -Transition PTA simvastatin  to Lipitor   8. Obesity: -Weight loss advised    Risk Assessment/Risk Scores:     Severity of Illness: The appropriate patient status for this patient is INPATIENT. Inpatient status is judged to be reasonable and necessary in order to provide the required intensity of service to ensure the patient's safety. The patient's presenting symptoms, physical exam findings, and initial radiographic and laboratory data in the context of their chronic comorbidities is felt to place them at high risk for further clinical deterioration. Furthermore, it is not anticipated that the patient will be medically stable for discharge from the hospital within 2 midnights of admission. The following factors support the patient status of inpatient.   " The patient's presenting symptoms include as above. " The worrisome physical exam findings include as above. " The initial radiographic and laboratory data are worrisome because of as above. " The chronic co-morbidities include as above.   * I certify that at the point of admission it is my clinical judgment that the patient will require inpatient hospital care spanning beyond 2 midnights from the point of admission due to high intensity of service, high risk for further deterioration and high frequency of surveillance required.*    For questions or updates, please contact [place] Please consult [email or web address] for contact info under     Signed, Christell Faith, PA-C  03/04/2021 2:09 PM

## 2021-03-04 NOTE — Progress Notes (Signed)
PT Cancellation Note  Patient Details Name: Julie James MRN: [id number] DOB: 1951/05/05   Cancelled Treatment:    Reason Eval/Treat Not Completed: Other (comment). Pt out of room at this time, PT to re-attempt as able.  Lieutenant Diego PT, DPT 11:39 AM,03/04/21

## 2021-03-04 NOTE — Plan of Care (Deleted)
PMT note:  Patient out of room. Recommend palliative to follow outpatient if D/C over the weekend.

## 2021-03-04 NOTE — Progress Notes (Signed)
Progress Note  Patient Name: Julie James Date of Encounter: 03/04/2021  Primary Cardiologist: New to [facility] - consult by Caryl Comes (previously seen by Dr. Clayborn Bigness this admission)  Subjective   Patient admitted 3/27 with witnessed and aborted cardiac arrest s/p immediate CPR. 911 was called and on fire/rescue arrival, patient was reportedly in a shockable rhythm. She received 2 shocks. On EMS arrival, she had a pulse and was in what appeared to be a normal sinus rhythm. Code STEMI was activated due to concern for anterior precordial lead elevations. En route, she was minimally responsive.  She arrived to the ED with pulses, unresponsive.  Code STEMI was cancelled by Dr. Lance Sell.  She was intubated in the emergency room due to being unresponsive.  In the ED, she developed WCT and was started on amiodarone which was subsequently stopped due to bradycardia.  She was consulted on by Dr. Clayborn Bigness with recommendation to consider echo. He did not recommend cardiac cath at that time. HS-Tn initially 13 with a delta of 105, ultimately peaking at 502 and down trending. Echo on 03/02/2021 showed an EF of 55-60%, no RWMA, mild LVH, Gr1DD, normal RVSF and ventricular cavity size, and mild aortic valve sclerosis without evidence of stenosis. MRI brain showed no acute intracranial process. She was treated with IV heparin for 48 hours. Initial potassium was low at 2.7, which was repleted. Magnesium 2.0. She was not rounded on by outside cardiology group on 3/29 or 3/30. There were initial concerns for anoxic brain injury, though she has subsequently had return of near normal cognitive function.   [facility] EP was asked to evaluate the patient at the request of Dr. Manuella Ghazi on 3/31. Dr. Caryl Comes evaluated her on 3/31 with recommendation to proceed with cardiac cath followed by transfer to [facility] for cMRI and ICD. Her hypokalemia was not felt to be a contributing factor to her arrest, as this was 4.1 five days  prior to her arrest, and was felt to be a consequence of her cardiac arrest. Initially, she was not inclined to pursue any cardiac workup, though after she had discussion with her family, she wanted to move forward with cardiac testing and treatment. There is no family history of sudden cardiac death.   This morning, she is feeling well and is without chest pain, dyspnea, palpitations, dizziness, presyncope, or syncope. She is NPO for cath today.   Inpatient Medications    Scheduled Meds: . [MAR Hold] chlorhexidine  15 mL Mouth Rinse BID  . [MAR Hold] feeding supplement  237 mL Oral BID BM  . [MAR Hold] losartan  100 mg Oral Daily   And  . [MAR Hold] hydrochlorothiazide  12.5 mg Oral Daily  . [MAR Hold] mouth rinse  15 mL Mouth Rinse q12n4p  . [MAR Hold] metoprolol succinate  50 mg Oral Daily  . [MAR Hold] pantoprazole (PROTONIX) IV  40 mg Intravenous QHS  . [MAR Hold] sodium chloride flush  3 mL Intravenous Q12H   Continuous Infusions: . [MAR Hold] sodium chloride    . sodium chloride    . [START ON 03/05/2021] sodium chloride     Followed by  . [START ON 03/05/2021] sodium chloride     PRN Meds: [MAR Hold] sodium chloride, sodium chloride, [MAR Hold] acetaminophen, [MAR Hold] albuterol, [MAR Hold] clonazepam, [MAR Hold] docusate sodium, [MAR Hold] hydrALAZINE, [MAR Hold] ondansetron (ZOFRAN) IV, [MAR Hold] polyethylene glycol, [MAR Hold] sodium chloride flush, sodium chloride flush, [MAR Hold] traZODone   Vital  Signs    Vitals:   03/03/21 2012 03/04/21 0525 03/04/21 0842 03/04/21 1100  BP: (!) 161/78 (!) 161/79 (!) 157/71 (!) 164/74  Pulse: 76 72 70 70  Resp: 19 18 18 20   Temp: 99.6 F (37.6 C) 98.6 F (37 C) 99.8 F (37.7 C) 97.8 F (36.6 C)  TempSrc: Oral  Oral Oral  SpO2: 94% 96% 97% 99%  Weight:      Height:        Intake/Output Summary (Last 24 hours) at 03/04/2021 1106 Last data filed at 03/03/2021 1435 Gross per 24 hour  Intake 360 ml  Output --  Net 360 ml    Filed Weights   02/27/21 1745 03/01/21 0500 03/03/21 0457  Weight: 118.1 kg 120 kg 112 kg    Telemetry    Unavailable for review at this time as patient is currently in the cath lab - Personally Reviewed  ECG    02/27/2021: NSR, 71 bpm, LBBB (new, prior EKG in 10/2020 with IVCD) - Personally Reviewed  Physical Exam   GEN: No acute distress.   Neck: No JVD. Cardiac: RRR, I/VI systolic murmur RUSB, no rubs, or gallops.  Respiratory: Clear to auscultation bilaterally.  GI: Soft, nontender, non-distended.   MS: No edema; No deformity. Neuro:  Alert and oriented x 3; Nonfocal.  Psych: Normal affect.  Labs    Chemistry Recent Labs  Lab 03/02/21 0605 03/03/21 0531 03/04/21 0504  NA 137 140 139  K 4.4 4.1 3.8  CL 106 104 100  CO2 26 28 29   GLUCOSE 106* 105* 109*  BUN 11 13 14   CREATININE 0.74 0.84 0.94  CALCIUM 9.0 9.5 9.4  PROT 6.6 7.1 7.5  ALBUMIN 3.4* 3.6 3.8  AST 43* 35 28  ALT 81* 59* 46*  ALKPHOS 66 66 64  BILITOT 0.8 0.8 0.8  GFRNONAA >60 >60 >60  ANIONGAP 5 8 10      Hematology Recent Labs  Lab 03/02/21 0605 03/03/21 0531 03/04/21 0504  WBC 9.4 8.0 7.4  RBC 3.47* 3.89 4.09  HGB 9.9* 11.3* 11.7*  HCT 30.8* 34.3* 35.9*  MCV 88.8 88.2 87.8  MCH 28.5 29.0 28.6  MCHC 32.1 32.9 32.6  RDW 14.3 14.4 14.0  PLT 264 301 322    Cardiac EnzymesNo results for input(s): TROPONINI in the last 168 hours. No results for input(s): TROPIPOC in the last 168 hours.   BNP Recent Labs  Lab 02/27/21 1539 02/27/21 1758  BNP 176.4* 122.2*     DDimer No results for input(s): DDIMER in the last 168 hours.   Radiology    MR BRAIN WO CONTRAST  Result Date: 03/02/2021 IMPRESSION: 1. No acute intracranial abnormality. 2. Chronic empty sella. Electronically Signed   By: Logan Bores M.D.   On: 03/02/2021 14:00   Cardiac Studies   2D echo 03/02/2021: 1. Left ventricular ejection fraction, by estimation, is 55 to 60%. The  left ventricle has normal function. The  left ventricle has no regional  wall motion abnormalities. There is mild left ventricular hypertrophy.  Left ventricular diastolic parameters  are consistent with Grade I diastolic dysfunction (impaired relaxation).  2. Right ventricular systolic function is normal. The right ventricular  size is normal. Tricuspid regurgitation signal is inadequate for assessing  PA pressure.  3. The mitral valve is normal in structure. No evidence of mitral valve  regurgitation. No evidence of mitral stenosis.  4. The aortic valve is normal in structure. Aortic valve regurgitation is  not visualized. Mild  aortic valve sclerosis is present, with no evidence  of aortic valve stenosis.  5. The inferior vena cava is normal in size with greater than 50%  respiratory variability, suggesting right atrial pressure of 3 mmHg.  6. Challenging image quality.   Patient Profile     69 y.o. female with history of HTN, HLD, bradycardia, and obesity, who we are seeing for second opinion of out of hospital witnessed cardiac arrest/new LBBB/WCT/elevated troponin/acute hypoxic respiratory failure/acute metabolic encephalopathy at the request of Dr. Manuella Ghazi.   Assessment & Plan    1. Out of hospital witnessed cardiac arrest/new LBBB/WCT/elevated troponin/acute hypoxic respiratory failure/acute metabolic encephalopathy: -Initially evaluated by [facility] Cardiology with code STEMI being cancelled -HS-Tn peaked at 502, possibly in the setting of CPR and shock x 2 -Echo with preserved LVSF and normal wall motion -Status post IV heparin x 48 hours -Status post extubation on 3/29, now on room air -IV amiodarone held for bradycardia, no further significant ventricular ectopy on tele -Cognition near baseline with MRI brain negative for CVA -Upon [facility] assuming care on 3/31, she was initially not inclined to proceed with cardiac testing and treatment, though after she spoke with her family, she wanted to pursue all  options -Plan for cardiac cath today (LHC per MD) -She will need to be transferred to [facility] for cMRI and ICD implantation to occur prior to formal discharge  -No driving x 6 months -Chest pain free -Continue Toprol XL -Risks and benefits of cardiac catheterization have been discussed with the patient including risks of bleeding, bruising, infection, kidney damage, stroke, heart attack, injury to a limb, urgent need for bypass, and death. The patient understands these risks and is willing to proceed with the procedure. All questions have been answered and concerns listened to  2. AKI: -Resolved with IV hydration   3. Hypokalemia: -Repleted -Maintain goal potassium 4.0  4. Anemia: -No obvious source of bleed -Likely dilutional  -Improving   5. Hyperglycemia: -Possibly reactive in the setting of the above -Check A1c  6. HTN: -BP on the high side this morning -Continue HCTZ, losartan, and Toprol for now -Escalate medications as indicated post cath  7. HLD: -LDL 106 this admission, if she is found to have significant CAD, would have a goal LDL of < 70 -Transition PTA simvastatin to Lipitor   8. Obesity: -Weight loss advised   For questions or updates, please contact [place] Please consult [email or web address] for contact info under Cardiology/STEMI.    Signed, Christell Faith, PA-C [place] Pager: [phone] 03/04/2021, 11:06 AM

## 2021-03-04 NOTE — H&P (Signed)
Cardiology Admission History and Physical:   Patient ID: Julie James MRN: [id number]; DOB: 1950/12/16   Admission date: 03/04/2021  PCP:  Verl Bangs, [place]  Cardiologist:  Ida Rogue, MD  Advanced Practice Provider:  No care team member to display Electrophysiologist:  Virl Axe, MD   Chief Complaint:  "I feel good"  Patient Profile:   Julie James is a 70 y.o. female with hx of essential hypertension who unfortunately suffered a witnessed, out of hospital cardiac arrest on 3/27. She was successfully resuscitated, and is now transferred for cMRI/secondary prevention ICD.   History of Present Illness:   Ms. Julie James only known history of cardiovascular disease was hypertension.  She was recently seen in her primary care doctor's office, and was noted to have significant sinus bradycardia with a left bundle branch block.  She was also hypertensive at the time, and was started on losartan/HCTZ.  The details of her hospital course at [facility] are well summarized elsewhere.  Briefly, Ms. Julie James suddenly became unresponsive at home on the 27th of last month.  Her husband started CPR and immediately called 911.  She was reportedly in a shockable rhythm at that time and received 2 shocks.  On arrival to the ED there was some concern for an anterior STEMI, although this activation was canceled.  She was intubated in the emergency room.  At some point she lost pulses and was in a wide-complex tachycardia.  She was started on amiodarone and admitted to the ICU.  She had an echocardiogram performed which revealed normal biventricular function and no significant valvular disease.  She had mild left ventricular hypertrophy.  She underwent a left heart catheterization via right radial access earlier today, and did not have any significant coronary disease with normal left-sided filling pressures.  She is now transferred here for further  investigation of her cardiac arrest to include cardiac MRI as well as likely secondary prevention ICD.  This evening, [name] tells me she is feeling quite well.  She notes that in the months preceding her arrest she was a little more short of breath than normal.  She states that ever since she had hypokalemia with chlorthalidone, her breathing has not been the same.  She has never had any episodes of chest pain.  She did have an episode of dizziness in the weeks before her cardiac arrest while she was laying in bed.  It lasted for minutes before self resolving.  Was not associated with any other symptoms.  She does report that her mother died of what sounds like a cardiac arrest in her 15s, and had been told that she had a "enlarged heart."  Her father also reportedly had cardiac issues, although passed away when he was struck by a moving vehicle.  She has 2 daughters who have lupus, one who died from end-stage renal disease that was secondary to lupus, but there is no other known significant history of cardiovascular disease or sudden cardiac death.   Past Medical History:  Diagnosis Date  . Cardiac arrest ([place]) 02/27/2021   a.  Witnessed out of hospital cardiac arrest s/p shock x2; b.  LHC 03/04/2021 w/ normal coronary arteries; c. TTE 03/02/21 - EF 55-60%, no RWMA, mild LVH, Gr1DD, nl RVSF and ventricular cavity size, and mild aortic valve sclerosis w/o evidence of stenosis  . Hypercholesteremia   . Hypertension     No past surgical history on file.   Medications Prior  to Admission: Prior to Admission medications   Medication Sig Start Date End Date Taking? Authorizing Provider  amLODipine (NORVASC) 5 MG tablet Take 1 tablet (5 mg total) by mouth daily. 05/21/20   Malfi, Lupita Raider, FNP  losartan-hydrochlorothiazide (HYZAAR) 100-12.5 MG tablet Take 1 tablet by mouth daily. 02/22/21   Kathrine Haddock, NP  metoprolol succinate (TOPROL-XL) 50 MG 24 hr tablet Take 1 tablet (50 mg total) by mouth  daily. Take with or immediately following a meal. 11/25/20   Malfi, Lupita Raider, FNP  simvastatin (ZOCOR) 40 MG tablet Take 1 tablet (40 mg total) by mouth at bedtime. 05/21/20   Malfi, Lupita Raider, FNP  losartan (COZAAR) 50 MG tablet Take 1.5 tablets (75 mg total) by mouth daily. 08/23/20 02/22/21  Verl Bangs, FNP     Allergies:    Allergies  Allergen Reactions  . Chlorthalidone Other (See Comments)    hypokalemia    Social History:   Social History   Socioeconomic History  . Marital status: Married    Spouse name: Not on file  . Number of children: Not on file  . Years of education: Not on file  . Highest education level: Not on file  Occupational History  . Not on file  Tobacco Use  . Smoking status: Never Smoker  . Smokeless tobacco: Never Used  Vaping Use  . Vaping Use: Never used  Substance and Sexual Activity  . Alcohol use: No  . Drug use: No  . Sexual activity: Not on file  Other Topics Concern  . Not on file  Social History Narrative  . Not on file   Social Determinants of Health   Financial Resource Strain: Not on file  Food Insecurity: Not on file  Transportation Needs: Not on file  Physical Activity: Not on file  Stress: Not on file  Social Connections: Not on file  Intimate Partner Violence: Not on file    Family History:   The patient's family history includes Cancer in her sister and sister.   Mother with "enlarged heart" and likely SCD in her 57s Father with "heart trouble" 2 daughters with lupus   ROS:  Please see the history of present illness.  All other ROS reviewed and negative.     Physical Exam/Data:   Vitals:   03/04/21 2121  BP: (!) 170/88  Pulse: 91  Resp: 20  Temp: 97.9 F (36.6 C)  TempSrc: Oral  SpO2: 100%  Weight: 110.4 kg  Height: 5\' 6"  (1.676 m)   No intake or output data in the 24 hours ending 03/04/21 2249 Last 3 Weights 03/04/2021 03/04/2021 03/03/2021  Weight (lbs) 243 lb 6.4 oz 244 lb 1.6 oz 247 lb  Weight (kg)  110.406 kg 110.723 kg 112.038 kg     Body mass index is 39.29 kg/m.  General:  Well nourished, well developed, in no acute distress HEENT: normal Lymph: no adenopathy Neck: no JVD Endocrine:  No thryomegaly Vascular: No carotid bruits; FA pulses 2+ bilaterally without bruits  Cardiac:  normal S1, S2; RRR; no murmur  Lungs:  clear to auscultation bilaterally, no wheezing, rhonchi or rales  Abd: soft, nontender, no hepatomegaly  Ext: no edema Musculoskeletal:  No deformities, BUE and BLE strength normal and equal Skin: warm and dry  Neuro:  CNs 2-12 intact, no focal abnormalities noted Psych:  Normal affect   EKG:  The ECG that was done 02/28/21 was personally reviewed and demonstrates sinus bradycardia, LBBB, upsloping inferolateral ST depression  Relevant CV Studies: LHC (03/04/21):   The left ventricular systolic function is normal.  LV end diastolic pressure is normal.  The left ventricular ejection fraction is 55-65% by visual estimate.   1.  Normal coronary arteries. 2.  Normal LV systolic function and left ventricular end-diastolic pressure.  Wall motion could not be fully evaluated.  TTE (03/02/21): 1. Left ventricular ejection fraction, by estimation, is 55 to 60%. The  left ventricle has normal function. The left ventricle has no regional  wall motion abnormalities. There is mild left ventricular hypertrophy.  Left ventricular diastolic parameters  are consistent with Grade I diastolic dysfunction (impaired relaxation).  2. Right ventricular systolic function is normal. The right ventricular  size is normal. Tricuspid regurgitation signal is inadequate for assessing  PA pressure.  3. The mitral valve is normal in structure. No evidence of mitral valve  regurgitation. No evidence of mitral stenosis.  4. The aortic valve is normal in structure. Aortic valve regurgitation is  not visualized. Mild aortic valve sclerosis is present, with no evidence  of aortic valve  stenosis.  5. The inferior vena cava is normal in size with greater than 50%  respiratory variability, suggesting right atrial pressure of 3 mmHg.  6. Challenging image quality.   Laboratory Data:  High Sensitivity Troponin:   Recent Labs  Lab 02/27/21 1539 02/27/21 1758 02/28/21 0454 02/28/21 0720  TROPONINIHS 13 105* 502* 408*      Chemistry Recent Labs  Lab 03/03/21 0531 03/04/21 0504 03/04/21 2155  NA 140 139  --   K 4.1 3.8  --   CL 104 100  --   CO2 28 29  --   GLUCOSE 105* 109*  --   BUN 13 14  --   CREATININE 0.84 0.94 1.02*  CALCIUM 9.5 9.4  --   GFRNONAA >60 >60 >60  ANIONGAP 8 10  --     Recent Labs  Lab 03/03/21 0531 03/04/21 0504  PROT 7.1 7.5  ALBUMIN 3.6 3.8  AST 35 28  ALT 59* 46*  ALKPHOS 66 64  BILITOT 0.8 0.8   Hematology Recent Labs  Lab 03/04/21 0504 03/04/21 2155  WBC 7.4 7.3  RBC 4.09 4.01  HGB 11.7* 11.6*  HCT 35.9* 36.0  MCV 87.8 89.8  MCH 28.6 28.9  MCHC 32.6 32.2  RDW 14.0 14.0  PLT 322 335   BNP Recent Labs  Lab 02/27/21 1539 02/27/21 1758  BNP 176.4* 122.2*    DDimer No results for input(s): DDIMER in the last 168 hours.   Radiology/Studies:  CARDIAC CATHETERIZATION  Result Date: 03/04/2021  The left ventricular systolic function is normal.  LV end diastolic pressure is normal.  The left ventricular ejection fraction is 55-65% by visual estimate.  1.  Normal coronary arteries. 2.  Normal LV systolic function and left ventricular end-diastolic pressure.  Wall motion could not be fully evaluated. Recommendations: No ischemic etiology for cardiac arrest. Continue evaluation by electrophysiology.   Assessment and Plan:   #Cardiac Arrest: - Etiology remains elusive. Hypokalemic on arrival by all likely due to arrest. She has normal biventricular function by echo with no significant valvular disease. She did not have any significant coronary disease on her left heart catheterization. Given her sinus node  dysfunction/LBBB, sarcoid with malignant ventricular arrhythmias could fit, although this would be a rather late presentation. Other genetic cardiomyopathies like Lamin could present with SCD, but again this would seem to be a rather late presentation  - cMRI  will be very informative to eval for scar/infiltrative disease. Plan for this on Monday  - If above unrevealing for a clearly reversible etiology (unlikely), then would be appropriate for secondary prevention ICD (possible Tuesday)  - Cont metoprolol   #Hypertension: - Remains relatively poorly controlled  - Cont losartan 100 mg qd + HCTZ - Start aldactone (HTN/antiarrhythmic benefit)  #Hyperlipidemia: - Cont atorvastatin   Risk Assessment/Risk Scores:  Severity of Illness: The appropriate patient status for this patient is OBSERVATION. Observation status is judged to be reasonable and necessary in order to provide the required intensity of service to ensure the patient's safety. The patient's presenting symptoms, physical exam findings, and initial radiographic and laboratory data in the context of their medical condition is felt to place them at decreased risk for further clinical deterioration. Furthermore, it is anticipated that the patient will be medically stable for discharge from the hospital within 2 midnights of admission. The following factors support the patient status of observation.   " The patient's presenting symptoms include cardiac arrest. " The physical exam findings include hypertension. " The initial radiographic and laboratory data are unrevealing for the etiology of her arrest. She will require further investigation with a cardiac MRI and likely secondary prevention ICD.  For questions or updates, please contact [place] Please consult [email or web address] for contact info under     Signed, Ronaldo Miyamoto, MD  03/04/2021 10:49 PM

## 2021-03-04 NOTE — Discharge Instructions (Signed)
Cardioverter Defibrillator Implantation, Care After The following information offers guidance on how to care for yourself after your procedure. Your health care provider may also give you more specific instructions. If you have problems or questions, contact your health care provider. What can I expect after the procedure? After the procedure, it is common to have:  Some pain. Pain may last a few days.  A slight bump over the skin where the device was placed. Sometimes, it is possible to feel the device under the skin. This is normal. During the months and years after your procedure, your health care provider will check the device, the wires (leads), and the battery every few months. The generator is monitored over time and will be replaced when the battery is depleted. Follow these instructions at home: Medicines  Take over-the-counter and prescription medicines only as told by your health care provider.  If you were prescribed an antibiotic medicine, take it as told by your health care provider. Do not stop taking the antibiotic even if you start to feel better. Bathing  Do not take baths, swim, or use a hot tub until your health care provider approves.  Ask your health care provider when you may take showers. You may only be allowed to take sponge baths. Cover your incision with a watertight covering if you take a sponge bath. Incision care  Follow instructions from your health care provider about how to take care of your incision. Make sure you: ? Wash your hands with soap and water for at least 20 seconds before and after you change your bandage (dressing). If soap and water are not available, use hand sanitizer. ? Change your dressing as told by your health care provider. ? Leave stitches (sutures), skin glue, adhesive strips, or staples in place. These skin closures may need to stay in place for 2 weeks or longer. If adhesive strip edges start to loosen and curl up, you may trim the  loose edges. Do not remove adhesive strips completely unless your health care provider tells you to do that.  Check your incision area every day for signs of infection. Check for: ? Redness, swelling, or more pain. ? Fluid or blood. ? Warmth. ? Pus or a bad smell.  Do not use lotions or ointments near the incision area unless told by your health care provider.  Do not wear tight clothes or clothes that could rub on your incision.   Activity  Do not lift anything that is heavier than 10 lb (4.5 kg), or the limit that you are told, until your health care provider says that it is safe.  Return to your normal activities as told by your health care provider. Ask your health care provider what activities are safe for you. Follow instructions from your health care provider about sports, exercise, work, and sexual activity restrictions after your procedure.  Avoid sitting for a long time without moving. Get up to take short walks every 1-2 hours. This is important to improve blood flow and breathing. Ask for help if you feel weak or unsteady.  For at least 6 weeks: ? Do not lift your upper arm above your shoulders. You may be given a sling to use. This means no tennis, golf, or swimming for this period of time. ? If you tend to sleep with your arm above your head, use a restraint, such as a sling, to prevent movement during sleep. ? You should use your shoulder on the side of the defibrillator  in daily tasks that do not require a lot of motion. ? Avoid sudden jerking, pulling, or chopping movements that pull your upper arm far away from your body.  Do not drive or operate machinery until your health care provider says that it is safe.  Participate in a cardiac rehabilitation program as told by your health care provider. A cardiac rehabilitation program is a treatment program to improve your health and well-being through exercise training, education, and counseling.   Electric and magnetic  fields  Tell all health care providers, including your dentist, that you have a defibrillator. Some medical devices and imaging methods such as MRI may affect your device.  If you must pass through a metal detector, quickly walk through it. Do not stop under the detector. Do not stand near it.  Avoid places or objects that have a strong electric or magnetic field, including: ? Airport [employer]. At the airport, let officials know that you have a defibrillator. Your defibrillator ID card will let you be checked in a way that is safe for you and will not damage your defibrillator. Do not let a security person wave a magnetic wand near your defibrillator. That can make it stop working. ? Power plants. ? Large electrical generators. ? Anti-theft systems or electronic article surveillance (EAS). ? Radiofrequency transmission towers, such as mobile phone and radio towers.  Some household devices and appliances may produce electromagnetic waves that affect your defibrillator. If you are not sure if something is safe to use, ask your health care provider. ? When using your mobile phone, hold it to the ear that is on the opposite side from the defibrillator. Do not leave your mobile phone in a pocket over the defibrillator. ? Many devices contain magnets. Examples include headphones and electronic cigarettes. Do not put them in a pocket near your device. If they are wearable, wear them as far away from the device as possible. ? Some devices are safe to use if they are held at least 12 inches (30 cm) away from your defibrillator. These include power tools, chain saws, lawn mowers, and speakers. ? You may safely use electric blankets, heating pads, computers, and microwave ovens. ? Do not use amateur or ham radio equipment, electric (arc) welding torches, magnetic mattresses or chairs, and body fat measuring scales. General instructions  Always keep your defibrillator ID card with you. The card should  list the implant date, device model, and manufacturer. Consider wearing a medical alert bracelet or necklace.  Have your defibrillator checked as often as told by your health care provider. Most defibrillators last for 5-7 years.  Do not use any products that contain nicotine or tobacco. These products include cigarettes, chewing tobacco, and vaping devices, such as e-cigarettes. If you need help quitting, ask your health care provider.  Discuss specific device restrictions with your device manufacturer or health care provider.  Get screened for depression and anxiety, and seek treatment if needed.  Keep all follow-up visits. This is important. Contact a health care provider if:  You feel one shock in your chest.  You gain weight suddenly.  You have a fever.  You have swelling in your arm on the same side of your body as the device or have swelling in your legs or feet.  It feels like your heart is fluttering or skipping beats (heart palpitations).  You have any of these signs of infection around your incision: ? Redness, swelling, or more pain. ? Fluid or blood. ?  Warmth. ? Pus or a bad smell.  You are feeling depressed, scared, anxious, or sad. Get help right away if:  You have chest pain.  You feel more than one shock.  You have problems breathing or have shortness of breath.  You have dizziness or fainting.  Your incision starts to open up. These symptoms may represent a serious problem that is an emergency. Do not wait to see if the symptoms will go away. Get medical help right away. Call your local emergency services (911 in the [place] Do not drive yourself to the hospital. Summary  It is important to follow your activity restrictions for at least 6 weeks following your device implantation. Do not lift your upper arm above your shoulder.  Always keep your defibrillator ID card with you. The card should list the implant date, device model, and manufacturer. Consider  wearing a medical alert bracelet or necklace.  Follow the device distance restrictions for electric and magnetic fields.  Keep all follow-up visits with your health care provider for monitoring of your defibrillator.  Contact your health care provider if you have signs of an infection such as a fever or redness, swelling, or more pain around your incision. Get help right away if you have chest pain, receive more than one shock, or faint. This information is not intended to replace advice given to you by your health care provider. Make sure you discuss any questions you have with your health care provider. Document Revised: 05/19/2020 Document Reviewed: 05/19/2020 Elsevier Patient Education  [place]. Defibrotide injection What is this medicine? Defibrotide (dee FYI broe tide) is used to treat blocked, small blood vessels in the liver that can occur after stem cell transplant. This medicine may be used for other purposes; ask your health care provider or pharmacist if you have questions. COMMON BRAND NAME(S): Defitelio What should I tell my health care provider before I take this medicine? They need to know if you have any of these conditions:  bleeding disorders  take medicines that treat or prevent blood clots  an unusual or allergic reaction to defibrotide, other medicines, foods, dyes, or preservatives  pregnant or trying to get pregnant  breast-feeding How should I use this medicine? This medicine is for infusion into a vein. It is given by a health care professional in a hospital or clinic setting. Talk to your pediatrician regarding the use of this medicine in children. While this drug may be prescribed for selected conditions, precautions do apply. Overdosage: If you think you have taken too much of this medicine contact a poison control center or emergency room at once. NOTE: This medicine is only for you. Do not share this medicine with others. What if I miss a  dose? This does not apply. What may interact with this medicine? Do not take this medicine with any of the following medications:  certain medicines that treat or prevent blood clots like heparin and alteplase This list may not describe all possible interactions. Give your health care provider a list of all the medicines, herbs, non-prescription drugs, or dietary supplements you use. Also tell them if you smoke, drink alcohol, or use illegal drugs. Some items may interact with your medicine. What should I watch for while using this medicine? Your condition will be monitored carefully while you are receiving this medicine. You may get dizzy. Do not drive, use machinery, or do anything that needs mental alertness until you know how this medicine affects you. Do not stand  or sit up quickly, especially if you are an older patient. This reduces the risk of dizzy or fainting spells. Avoid alcoholic drinks; they can make you more dizzy. What side effects may I notice from receiving this medicine? Side effects that you should report to your doctor or health care professional as soon as possible:  allergic reactions like skin rash, itching or hives, swelling of the face, lips, or tongue  signs and symptoms of bleeding such as bloody or black, tarry stools; red or dark-brown urine; spitting up blood or brown material that looks like coffee grounds; red spots on the skin; unusual bruising or bleeding from the eyes, gums, or nose  signs and symptoms of infection like fever or chills; cough; sore throat; pain or trouble passing urine  signs and symptoms of low blood pressure like dizziness; feeling faint or lightheaded, falls; unusually weak or tired Side effects that usually do not require medical attention (report to your doctor or health care professional if they continue or are bothersome):  diarrhea  nausea  vomiting This list may not describe all possible side effects. Call your doctor for  medical advice about side effects. You may report side effects to FDA at 1-800-FDA-1088. Where should I keep my medicine? This drug is given in a hospital or clinic and will not be stored at home. NOTE: This sheet is a summary. It may not cover all possible information. If you have questions about this medicine, talk to your doctor, pharmacist, or health care provider.  2021 Elsevier/Gold Standard (2015-12-23 63:81:77)

## 2021-03-04 NOTE — Interval H&P Note (Signed)
History and Physical Interval Note:  03/04/2021 11:36 AM  Julie James  has presented today for surgery, with the diagnosis of cardiac arrest.  The various methods of treatment have been discussed with the patient and family. After consideration of risks, benefits and other options for treatment, the patient has consented to  Procedure(s): LEFT HEART CATH AND CORONARY ANGIOGRAPHY (N/A) as a surgical intervention.  The patient's history has been reviewed, patient examined, no change in status, stable for surgery.  I have reviewed the patient's chart and labs.  Questions were answered to the patient's satisfaction.     Kathlyn [place]

## 2021-03-05 DIAGNOSIS — I469 Cardiac arrest, cause unspecified: Secondary | ICD-10-CM

## 2021-03-05 LAB — BASIC METABOLIC PANEL
Anion gap: 9 (ref 5–15)
BUN: 15 mg/dL (ref 8–23)
CO2: 28 mmol/L (ref 22–32)
Calcium: 9.3 mg/dL (ref 8.9–10.3)
Chloride: 100 mmol/L (ref 98–111)
Creatinine, Ser: 0.9 mg/dL (ref 0.44–1.00)
GFR, Estimated: >60 mL/min (ref >60)
Glucose, Bld: 102 mg/dL — ABNORMAL HIGH (ref 70–99)
Potassium: 3.3 mmol/L — ABNORMAL LOW (ref 3.5–5.1)
Sodium: 137 mmol/L (ref 135–145)

## 2021-03-05 LAB — CBC
HCT: 36.3 % (ref 36.0–46.0)
Hemoglobin: 11.8 g/dL — ABNORMAL LOW (ref 12.0–15.0)
MCH: 28.9 pg (ref 26.0–34.0)
MCHC: 32.5 g/dL (ref 30.0–36.0)
MCV: 88.8 fL (ref 80.0–100.0)
Platelets: 326 10*3/uL (ref 150–400)
RBC: 4.09 MIL/uL (ref 3.87–5.11)
RDW: 14.1 % (ref 11.5–15.5)
WBC: 6.9 10*3/uL (ref 4.0–10.5)
nRBC: 0 % (ref 0.0–0.2)

## 2021-03-05 MED ORDER — POTASSIUM CHLORIDE CRYS ER 20 MEQ PO TBCR
40.0000 meq | EXTENDED_RELEASE_TABLET | Freq: Two times a day (BID) | ORAL | Status: DC
  Filled 2021-03-05: qty 2

## 2021-03-05 MED ORDER — POTASSIUM CHLORIDE CRYS ER 20 MEQ PO TBCR
40.0000 meq | EXTENDED_RELEASE_TABLET | ORAL | Status: AC
Start: 2021-03-05 — End: 2021-03-05
  Administered 2021-03-05 (×3): 40 meq via ORAL
  Filled 2021-03-05 (×2): qty 2

## 2021-03-05 MED ORDER — MAGNESIUM SULFATE IN D5W 1-5 GM/100ML-% IV SOLN
1.0000 g | Freq: Once | INTRAVENOUS | Status: AC
  Administered 2021-03-05: 1 g via INTRAVENOUS
  Filled 2021-03-05: qty 100

## 2021-03-05 MED ORDER — POTASSIUM CHLORIDE 10 MEQ/100ML IV SOLN: 10.0000 meq | INTRAVENOUS | Status: DC

## 2021-03-05 NOTE — Progress Notes (Signed)
Pt currently has 2 PIV's working properly upon assessment. RN at bedside and flushed without difficulty.

## 2021-03-05 NOTE — Progress Notes (Signed)
Progress Note  Patient Name: Julie James Date of Encounter: 03/05/2021  Primary Cardiologist: Ida Rogue, MD   Subjective   Mild right-sided chest pressure which she feels could be gas or could be post CPR discomfort.  She is having no lightheadedness or dizziness today.  Anticipating cardiac MRI on Monday.  Having very frequent ectopy, will replete electrolytes aggressively.  Inpatient Medications    Scheduled Meds: . aspirin EC  81 mg Oral Daily  . atorvastatin  40 mg Oral Daily  . chlorhexidine  15 mL Mouth/Throat BID  . heparin  5,000 Units Subcutaneous Q8H  . hydrochlorothiazide  12.5 mg Oral Daily  . losartan  100 mg Oral Daily  . metoprolol succinate  50 mg Oral Daily  . pantoprazole  40 mg Oral QHS  . potassium chloride  40 mEq Oral BID  . spironolactone  25 mg Oral Daily   Continuous Infusions: . magnesium sulfate bolus IVPB     PRN Meds: acetaminophen, clonazePAM, docusate sodium, nitroGLYCERIN, ondansetron (ZOFRAN) IV, traZODone   Vital Signs    Vitals:   03/05/21 0034 03/05/21 0434 03/05/21 0700 03/05/21 1048  BP: (!) 146/73 (!) 155/65 (!) 136/94 (!) 144/67  Pulse: 69 63 75 68  Resp: 19 20 16    Temp: 97.9 F (36.6 C) 99 F (37.2 C) 98.4 F (36.9 C)   TempSrc: Oral Oral Oral   SpO2: 100% 98%    Weight:      Height:       No intake or output data in the 24 hours ending 03/05/21 1154 Filed Weights   03/04/21 2121  Weight: 110.4 kg    Telemetry    SR LBBB frequent PVCs - Personally Reviewed  ECG    SR LBBB PVCs - Personally Reviewed  Physical Exam   GEN: No acute distress.   Neck: No JVD Cardiac: regular rhythm, normal rate, no murmurs, rubs, or gallops.  Respiratory: Clear to auscultation bilaterally. GI: Soft, nontender, non-distended  MS: No edema; No deformity. Neuro:  Nonfocal  Psych: Normal affect   Labs    Chemistry Recent Labs  Lab 03/02/21 0605 03/03/21 0531 03/04/21 0504 03/04/21 2155 03/05/21 0248  NA  137 140 139  --  137  K 4.4 4.1 3.8  --  3.3*  CL 106 104 100  --  100  CO2 26 28 29   --  28  GLUCOSE 106* 105* 109*  --  102*  BUN 11 13 14   --  15  CREATININE 0.74 0.84 0.94 1.02* 0.90  CALCIUM 9.0 9.5 9.4  --  9.3  PROT 6.6 7.1 7.5  --   --   ALBUMIN 3.4* 3.6 3.8  --   --   AST 43* 35 28  --   --   ALT 81* 59* 46*  --   --   ALKPHOS 66 66 64  --   --   BILITOT 0.8 0.8 0.8  --   --   GFRNONAA >60 >60 >60 >60 >60  ANIONGAP 5 8 10   --  9     Hematology Recent Labs  Lab 03/04/21 0504 03/04/21 2155 03/05/21 0248  WBC 7.4 7.3 6.9  RBC 4.09 4.01 4.09  HGB 11.7* 11.6* 11.8*  HCT 35.9* 36.0 36.3  MCV 87.8 89.8 88.8  MCH 28.6 28.9 28.9  MCHC 32.6 32.2 32.5  RDW 14.0 14.0 14.1  PLT 322 335 326    Cardiac EnzymesNo results for input(s): TROPONINI in the last 168 hours.  No results for input(s): TROPIPOC in the last 168 hours.   BNP Recent Labs  Lab 02/27/21 1539 02/27/21 1758  BNP 176.4* 122.2*     DDimer No results for input(s): DDIMER in the last 168 hours.   Radiology    CARDIAC CATHETERIZATION  Result Date: 03/04/2021  The left ventricular systolic function is normal.  LV end diastolic pressure is normal.  The left ventricular ejection fraction is 55-65% by visual estimate.  1.  Normal coronary arteries. 2.  Normal LV systolic function and left ventricular end-diastolic pressure.  Wall motion could not be fully evaluated. Recommendations: No ischemic etiology for cardiac arrest. Continue evaluation by electrophysiology.    Cardiac Studies  cath   The left ventricular systolic function is normal.  LV end diastolic pressure is normal.  The left ventricular ejection fraction is 55-65% by visual estimate.   1.  Normal coronary arteries. 2.  Normal LV systolic function and left ventricular end-diastolic pressure.  Wall motion could not be fully evaluated.   Recommendations: No ischemic etiology for cardiac arrest. Continue evaluation by electrophysiology.    Echo  1. Left ventricular ejection fraction, by estimation, is 55 to 60%. The  left ventricle has normal function. The left ventricle has no regional  wall motion abnormalities. There is mild left ventricular hypertrophy.  Left ventricular diastolic parameters  are consistent with Grade I diastolic dysfunction (impaired relaxation).   2. Right ventricular systolic function is normal. The right ventricular  size is normal. Tricuspid regurgitation signal is inadequate for assessing  PA pressure.   3. The mitral valve is normal in structure. No evidence of mitral valve  regurgitation. No evidence of mitral stenosis.   4. The aortic valve is normal in structure. Aortic valve regurgitation is  not visualized. Mild aortic valve sclerosis is present, with no evidence  of aortic valve stenosis.   5. The inferior vena cava is normal in size with greater than 50%  respiratory variability, suggesting right atrial pressure of 3 mmHg.   6. Challenging image quality.    Patient Profile     Julie James is a 70 y.o. female with hx of essential hypertension who unfortunately suffered a witnessed, out of hospital cardiac arrest on 3/27. She was successfully resuscitated, and is now transferred for cMRI/secondary prevention ICD.   Assessment & Plan   Principal Problem:   Cardiac arrest [facility]) Active Problems:   Hypertension   Hyperlipidemia   Hypokalemia   Cardiac arrest-no coronary disease on cath, she is anticipating cardiac MRI and secondary prevention ICD on Monday. -Frequent ectopy in the setting of hypokalemia with a prior history of hypokalemia due to medications.  I will administer potassium 40 mEq every 4 hours for 3 doses and then begin potassium 40 mEq twice daily tomorrow pending potassium.  For now I have discontinued her hydrochlorothiazide.  She is on spironolactone, which will help with hypokalemia.  Continue metoprolol succinate 50 mg daily.  Preserved EF on echocardiogram,  await cardiac MR.  Can increase dose of spironolactone if blood pressure rises off of hydrochlorothiazide.  She appears euvolemic, no other diuretic indicated today.   For questions or updates, please contact [place] Please consult [email or web address] for contact info under        Signed, Elouise Munroe, MD  03/05/2021, 11:54 AM

## 2021-03-06 LAB — CBC
HCT: 39 % (ref 36.0–46.0)
Hemoglobin: 12.4 g/dL (ref 12.0–15.0)
MCH: 28.2 pg (ref 26.0–34.0)
MCHC: 31.8 g/dL (ref 30.0–36.0)
MCV: 88.6 fL (ref 80.0–100.0)
Platelets: 357 10*3/uL (ref 150–400)
RBC: 4.4 MIL/uL (ref 3.87–5.11)
RDW: 14.1 % (ref 11.5–15.5)
WBC: 6.9 10*3/uL (ref 4.0–10.5)
nRBC: 0 % (ref 0.0–0.2)

## 2021-03-06 LAB — BASIC METABOLIC PANEL
Anion gap: 8 (ref 5–15)
BUN: 14 mg/dL (ref 8–23)
CO2: 27 mmol/L (ref 22–32)
Calcium: 9.5 mg/dL (ref 8.9–10.3)
Chloride: 104 mmol/L (ref 98–111)
Creatinine, Ser: 0.94 mg/dL (ref 0.44–1.00)
GFR, Estimated: >60 mL/min (ref >60)
Glucose, Bld: 160 mg/dL — ABNORMAL HIGH (ref 70–99)
Potassium: 4.3 mmol/L (ref 3.5–5.1)
Sodium: 139 mmol/L (ref 135–145)

## 2021-03-06 MED ORDER — SPIRONOLACTONE 25 MG PO TABS
50.0000 mg | ORAL_TABLET | Freq: Every day | ORAL | Status: DC
  Administered 2021-03-07 – 2021-03-09 (×3): 50 mg via ORAL
  Filled 2021-03-06 (×3): qty 2

## 2021-03-06 MED ORDER — SPIRONOLACTONE 25 MG PO TABS
25.0000 mg | ORAL_TABLET | Freq: Once | ORAL | Status: AC
  Administered 2021-03-06: 25 mg via ORAL
  Filled 2021-03-06: qty 1

## 2021-03-06 NOTE — Plan of Care (Signed)

## 2021-03-06 NOTE — Progress Notes (Signed)
Progress Note  Patient Name: Julie James Date of Encounter: 03/06/2021  Primary Cardiologist: Ida Rogue, MD   Subjective   CP to chest palpitation which is improving, we suspect related to CPR. Spoke to daughter Reuben Likes on the phone in the room with the patient. Plan for CMR tomorrow.  Inpatient Medications    Scheduled Meds: . aspirin EC  81 mg Oral Daily  . atorvastatin  40 mg Oral Daily  . chlorhexidine  15 mL Mouth/Throat BID  . heparin  5,000 Units Subcutaneous Q8H  . losartan  100 mg Oral Daily  . metoprolol succinate  50 mg Oral Daily  . pantoprazole  40 mg Oral QHS  . spironolactone  25 mg Oral Daily   Continuous Infusions:  PRN Meds: acetaminophen, clonazePAM, docusate sodium, nitroGLYCERIN, ondansetron (ZOFRAN) IV, traZODone   Vital Signs    Vitals:   03/05/21 1048 03/05/21 1951 03/06/21 0621 03/06/21 1034  BP: (!) 144/67 (!) 144/75 (!) 150/74 128/73  Pulse: 68 75 77 82  Resp:  17 18   Temp:  98.8 F (37.1 C) 99.2 F (37.3 C)   TempSrc:  Oral Oral   SpO2:  99% 99%   Weight:   108.7 kg   Height:        Intake/Output Summary (Last 24 hours) at 03/06/2021 1250 Last data filed at 03/06/2021 1036 Gross per 24 hour  Intake 180 ml  Output --  Net 180 ml   Filed Weights   03/04/21 2121 03/06/21 0621  Weight: 110.4 kg 108.7 kg    Telemetry    SR LBBB frequent PVCs - Personally Reviewed  ECG    SR LBBB PVCs - Personally Reviewed  Physical Exam   GEN: No acute distress.   Neck: No JVD Cardiac: RRR, no murmurs, rubs, or gallops.  Respiratory: Clear to auscultation bilaterally. GI: Soft, nontender, non-distended  MS: No edema; No deformity. Neuro:  Nonfocal  Psych: Normal affect    Labs    Chemistry Recent Labs  Lab 03/02/21 0605 03/03/21 0531 03/04/21 0504 03/04/21 2155 03/05/21 0248 03/06/21 0945  NA 137 140 139  --  137 139  K 4.4 4.1 3.8  --  3.3* 4.3  CL 106 104 100  --  100 104  CO2 26 28 29   --  28 27  GLUCOSE  106* 105* 109*  --  102* 160*  BUN 11 13 14   --  15 14  CREATININE 0.74 0.84 0.94 1.02* 0.90 0.94  CALCIUM 9.0 9.5 9.4  --  9.3 9.5  PROT 6.6 7.1 7.5  --   --   --   ALBUMIN 3.4* 3.6 3.8  --   --   --   AST 43* 35 28  --   --   --   ALT 81* 59* 46*  --   --   --   ALKPHOS 66 66 64  --   --   --   BILITOT 0.8 0.8 0.8  --   --   --   GFRNONAA >60 >60 >60 >60 >60 >60  ANIONGAP 5 8 10   --  9 8     Hematology Recent Labs  Lab 03/04/21 2155 03/05/21 0248 03/06/21 0945  WBC 7.3 6.9 6.9  RBC 4.01 4.09 4.40  HGB 11.6* 11.8* 12.4  HCT 36.0 36.3 39.0  MCV 89.8 88.8 88.6  MCH 28.9 28.9 28.2  MCHC 32.2 32.5 31.8  RDW 14.0 14.1 14.1  PLT 335 326 357  Cardiac EnzymesNo results for input(s): TROPONINI in the last 168 hours. No results for input(s): TROPIPOC in the last 168 hours.   BNP Recent Labs  Lab 02/27/21 1539 02/27/21 1758  BNP 176.4* 122.2*     DDimer No results for input(s): DDIMER in the last 168 hours.   Radiology    No results found.  Cardiac Studies  cath   The left ventricular systolic function is normal.  LV end diastolic pressure is normal.  The left ventricular ejection fraction is 55-65% by visual estimate.   1.  Normal coronary arteries. 2.  Normal LV systolic function and left ventricular end-diastolic pressure.  Wall motion could not be fully evaluated.   Recommendations: No ischemic etiology for cardiac arrest. Continue evaluation by electrophysiology.   Echo  1. Left ventricular ejection fraction, by estimation, is 55 to 60%. The  left ventricle has normal function. The left ventricle has no regional  wall motion abnormalities. There is mild left ventricular hypertrophy.  Left ventricular diastolic parameters  are consistent with Grade I diastolic dysfunction (impaired relaxation).   2. Right ventricular systolic function is normal. The right ventricular  size is normal. Tricuspid regurgitation signal is inadequate for assessing  PA  pressure.   3. The mitral valve is normal in structure. No evidence of mitral valve  regurgitation. No evidence of mitral stenosis.   4. The aortic valve is normal in structure. Aortic valve regurgitation is  not visualized. Mild aortic valve sclerosis is present, with no evidence  of aortic valve stenosis.   5. The inferior vena cava is normal in size with greater than 50%  respiratory variability, suggesting right atrial pressure of 3 mmHg.   6. Challenging image quality.    Patient Profile     Julie James is a 70 y.o. female with hx of essential hypertension who unfortunately suffered a witnessed, out of hospital cardiac arrest on 3/27. She was successfully resuscitated, and is now transferred for cMRI/secondary prevention ICD.   Assessment & Plan   Principal Problem:   Cardiac arrest [facility]) Active Problems:   Hypertension   Hyperlipidemia   Hypokalemia   Cardiac arrest-no coronary disease on cath, she is anticipating cardiac MRI and secondary prevention ICD tomorrow. -Frequent ectopy in the setting of hypokalemia with a prior history of hypokalemia due to medications.   -K 4.3 today, check again tomorrow. - For now I have discontinued her hydrochlorothiazide.  She is on spironolactone, which will help with hypokalemia.  Continue metoprolol succinate 50 mg daily.  Preserved EF on echocardiogram, await cardiac MR.   - She appears euvolemic, no other diuretic indicated today. - will increase spironolactone due to BP mildly increased off of HCTZ.   For questions or updates, please contact [place] Please consult [email or web address] for contact info under        Signed, Elouise Munroe, MD  03/06/2021, 12:50 PM

## 2021-03-06 NOTE — Progress Notes (Signed)
See consult note Needs MRI and then ICD prior to discharge

## 2021-03-07 ENCOUNTER — Encounter: Payer: Self-pay | Admitting: Cardiovascular Disease

## 2021-03-07 ENCOUNTER — Inpatient Hospital Stay (HOSPITAL_COMMUNITY): Payer: Medicare Other

## 2021-03-07 ENCOUNTER — Other Ambulatory Visit: Payer: Self-pay

## 2021-03-07 DIAGNOSIS — E876 Hypokalemia: Secondary | ICD-10-CM

## 2021-03-07 DIAGNOSIS — I469 Cardiac arrest, cause unspecified: Secondary | ICD-10-CM

## 2021-03-07 LAB — BASIC METABOLIC PANEL
Anion gap: 11 (ref 5–15)
BUN: 13 mg/dL (ref 8–23)
CO2: 25 mmol/L (ref 22–32)
Calcium: 10 mg/dL (ref 8.9–10.3)
Chloride: 102 mmol/L (ref 98–111)
Creatinine, Ser: 0.96 mg/dL (ref 0.44–1.00)
GFR, Estimated: >60 mL/min (ref >60)
Glucose, Bld: 101 mg/dL — ABNORMAL HIGH (ref 70–99)
Potassium: 4.6 mmol/L (ref 3.5–5.1)
Sodium: 138 mmol/L (ref 135–145)

## 2021-03-07 IMAGING — MR MR CARD MORPHOLOGY WO/W CM
45 of 48 series · 45 of 48 positions shown · IV contrast (Contrast agent)
Comparison: none

CLINICAL DATA: Cardiac arrest

EXAM:
CARDIAC MRI
TECHNIQUE: The patient was scanned on a 1.5 Tesla GE magnet. A dedicated
cardiac coil was used. Functional imaging was done using [employer]
sequences. [DATE], and 4 chamber views were done to assess for RWMA's.
Modified NAZARETH rule using a short axis stack was used to
calculate an ejection fraction on a dedicated work station using
[employer] software. The patient received 10 cc of Gadavist. After 10
minutes inversion recovery sequences were used to assess for
infiltration and scar tissue.
CONTRAST:  Gadavist 10 cc

[Series 4: t2_haste_db_tra_bh · axial · 8.0mm · 1.41mm/px · 1 of 16 slices shown]
[im 1/16]
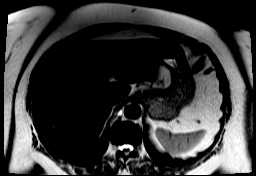

[Series 8: bSSFP · oblique · 8.0mm · 1.61mm/px · 1 of 25 slices shown (1 of 21)]
[im 1/25]
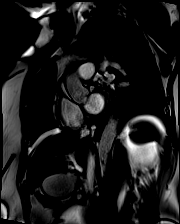

[Series 9: bSSFP · oblique · 8.0mm · 1.61mm/px · 1 of 25 slices shown (2 of 21)]
[im 1/25]
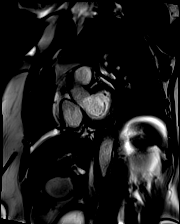

[Series 10: bSSFP · oblique · 8.0mm · 1.61mm/px · 1 of 25 slices shown (3 of 21)]
[im 1/25]
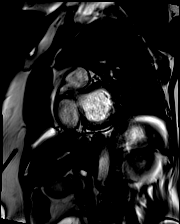

[Series 11: bSSFP · oblique · 8.0mm · 1.61mm/px · 1 of 25 slices shown (4 of 21)]
[im 1/25]
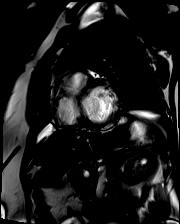

[Series 12: bSSFP · oblique · 8.0mm · 1.61mm/px · 1 of 25 slices shown (5 of 21)]
[im 1/25]
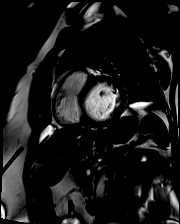

[Series 13: bSSFP · oblique · 8.0mm · 1.61mm/px · 1 of 25 slices shown (6 of 21)]
[im 1/25]
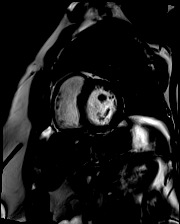

[Series 14: bSSFP · oblique · 8.0mm · 1.61mm/px · 1 of 25 slices shown (7 of 21)]
[im 1/25]
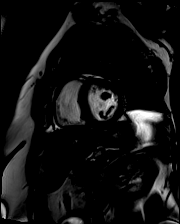

[Series 15: bSSFP · oblique · 8.0mm · 1.61mm/px · 1 of 25 slices shown (8 of 21)]
[im 1/25]
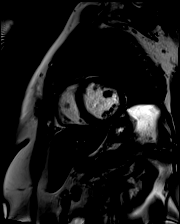

[Series 16: bSSFP · oblique · 8.0mm · 1.61mm/px · 1 of 25 slices shown (9 of 21)]
[im 1/25]
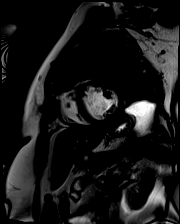

[Series 17: bSSFP · oblique · 8.0mm · 1.61mm/px · 1 of 25 slices shown (10 of 21)]
[im 1/25]
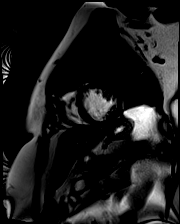

[Series 18: bSSFP · oblique · 8.0mm · 1.61mm/px · 1 of 25 slices shown (11 of 21)]
[im 1/25]
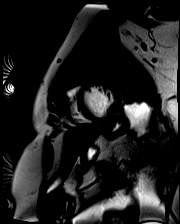

[Series 19: bSSFP · oblique · 8.0mm · 1.61mm/px · 1 of 25 slices shown (12 of 21)]
[im 1/25]
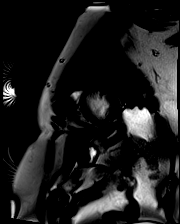

[Series 20: bSSFP · oblique · 8.0mm · 1.61mm/px · 1 of 25 slices shown (13 of 21)]
[im 1/25]
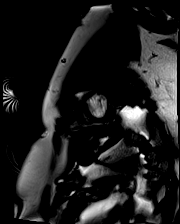

[Series 21: bSSFP · oblique · 8.0mm · 1.61mm/px · 1 of 25 slices shown (14 of 21)]
[im 1/25]
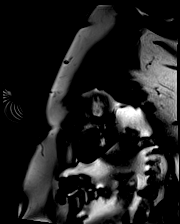

[Series 22: bSSFP · oblique · 8.0mm · 1.61mm/px · 1 of 25 slices shown (15 of 21)]
[im 1/25]
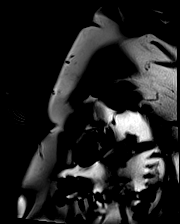

[Series 23: bSSFP · oblique · 8.0mm · 1.61mm/px · 1 of 25 slices shown (16 of 21)]
[im 1/25]
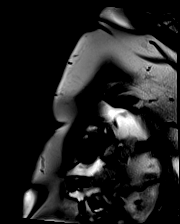

[Series 24: bSSFP · oblique · 8.0mm · 1.61mm/px · 1 of 25 slices shown (17 of 21)]
[im 1/25]
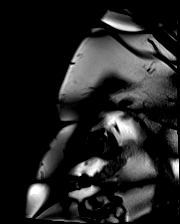

[Series 25: bSSFP · oblique · 6.0mm · 1.41mm/px · 1 of 25 slices shown (18 of 21)]
[im 1/25]
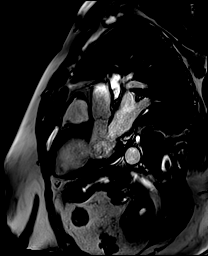

[Series 26: bSSFP · oblique · 6.0mm · 1.41mm/px · 1 of 25 slices shown (19 of 21)]
[im 1/25]
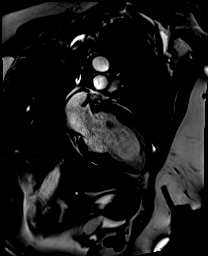

[Series 27: bSSFP · axial · 6.0mm · 1.41mm/px · 1 of 25 slices shown (20 of 21)]
[im 1/25]
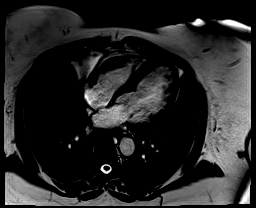

[Series 28: t1_tse_db short axis · axial · 8.0mm · 1.32mm/px · 1 of 13 slices shown]
[im 1/13]
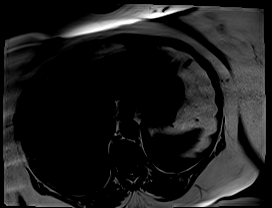

[Series 29: t1_tse_fs db short · axial · 8.0mm · 1.32mm/px · 1 of 13 slices shown]
[im 1/13]
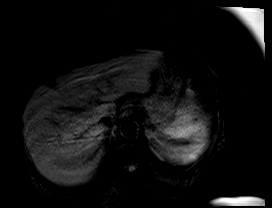

[Series 30: (id)_long_t1 · oblique · 8.0mm · 1.56mm/px · 1 of 24 slices shown]
[im 1/24]
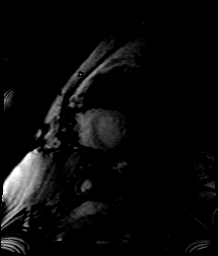

[Series 31: (id)_long_t1_moco · oblique · 8.0mm · 1.56mm/px · 1 of 24 slices shown]
[im 1/24]
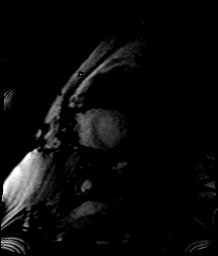

[Series 32: (id)_long_t1_moco_t1 · oblique · 8.0mm · 1.56mm/px · 1 of 6 slices shown]
[im 1/6]
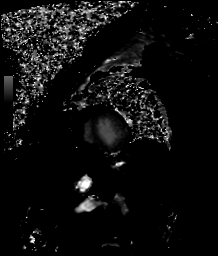

[Series 34: (id)_trufi · oblique · 8.0mm · 2.08mm/px · 1 of 9 slices shown]
[im 1/9]
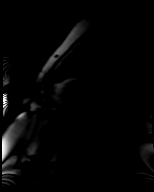

[Series 35: (id)_trufi_moco · oblique · 8.0mm · 2.08mm/px · 1 of 9 slices shown]
[im 1/9]
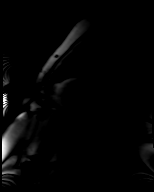

[Series 36: (id)_trufi_moco_t2 · oblique · 8.0mm · 2.08mm/px · 1 of 3 slices shown]
[im 1/3]
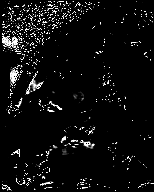

[Series 38: pre short axis · oblique · non-contrast · 8.0mm · 2.25mm/px · 1 of 10 slices shown (1 of 6)]
[im 1/10]
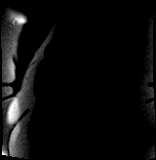

[Series 39: pre short axis · oblique · non-contrast · 8.0mm · 2.25mm/px · 1 of 10 slices shown (2 of 6)]
[im 1/10]
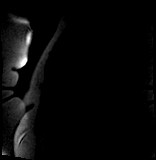

[Series 40: pre short axis · oblique · non-contrast · 8.0mm · 2.25mm/px · 1 of 10 slices shown (3 of 6)]
[im 1/10]
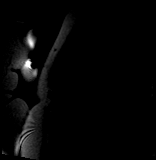

[Series 41: pre short axis · oblique · non-contrast · 8.0mm · 2.25mm/px · 1 of 10 slices shown (4 of 6)]
[im 1/10]
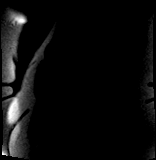

[Series 42: pre short axis · oblique · non-contrast · 8.0mm · 2.25mm/px · 1 of 10 slices shown (5 of 6)]
[im 1/10]
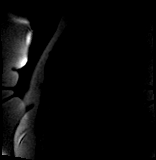

[Series 43: pre short axis · oblique · non-contrast · 8.0mm · 2.25mm/px · 1 of 10 slices shown (6 of 6)]
[im 1/10]
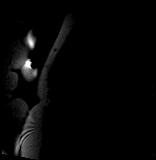

[Series 44: rest short axis · oblique · 8.0mm · 2.25mm/px · 1 of 60 slices shown (1 of 6)]
[im 1/60]
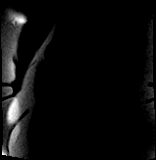

[Series 45: rest short axis · oblique · 8.0mm · 2.25mm/px · 1 of 60 slices shown (2 of 6)]
[im 1/60]
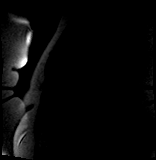

[Series 46: rest short axis · oblique · 8.0mm · 2.25mm/px · 1 of 60 slices shown (3 of 6)]
[im 1/60]
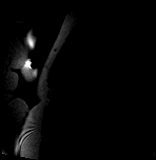

[Series 47: rest short axis · oblique · 8.0mm · 2.25mm/px · 1 of 60 slices shown (4 of 6)]
[im 1/60]
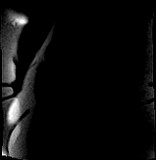

[Series 48: rest short axis · oblique · 8.0mm · 2.25mm/px · 1 of 60 slices shown (5 of 6)]
[im 1/60]
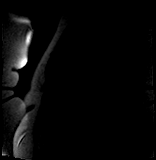

[Series 49: rest short axis · oblique · 8.0mm · 2.25mm/px · 1 of 60 slices shown (6 of 6)]
[im 1/60]
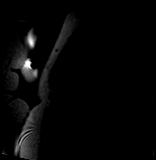

[Series 50: bSSFP · coronal · 6.0mm · 1.41mm/px · 1 of 25 slices shown (21 of 21)]
[im 1/25]
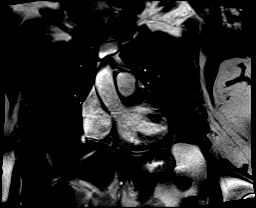

[Series 51: aortic valve cine · oblique · 6.0mm · 1.41mm/px · 1 of 25 slices shown]
[im 1/25]
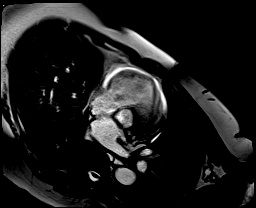

[Series 52: cine rvit · oblique · 6.0mm · 1.41mm/px · 1 of 25 slices shown]
[im 1/25]
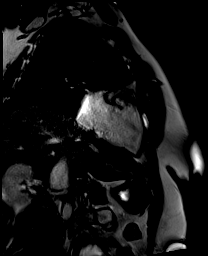

[Series 53: cine rvot · sagittal · 6.0mm · 1.41mm/px · 1 of 25 slices shown]
[im 1/25]
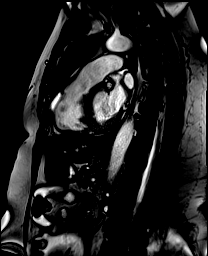

[45 of 48 positions shown; findings below may reference images not displayed]

FINDINGS: Limited images of the lung fields showed no gross abnormalities.

Normal left ventricular size with mild LV hypertrophy. Septal
lateral dyssynchrony due to LBBB, also there appeared to be mild
inferior and inferolateral hypokinesis. Overall LV EF 46%, mildly
decreased. Normal left and right atrial sizes. There was an atrial
septal aneurysm noted. Normal right ventricular size and systolic
function, EF 49%. No significant mitral regurgitation noted.
Trileaflet aortic valve with no significant stenosis or
regurgitation.

On delayed enhancement imaging, there was no myocardial late
gadolinium enhancement (LGE).

Measurements:

LVEDV 209 mL

LVSV 95 mL
LVEF 46%

RVEDV 140 mL
RVSV 69 mL
RVEF 49%

ECV 27%
IMPRESSION: 1. Normal LV size with mild concentric LV hypertrophy.
Septal-lateral dyssynchrony consistent with LBBB, also mild inferior
and inferolateral hypokinesis. EF 46%.

2.  Normal RV size and systolic function, EF 49%.

3. No myocardial LGE, so no definitive evidence for prior MI,
infiltrative disease, or myocarditis. Normal extracellular fluid
volume percentage.

NAZARETH

## 2021-03-07 MED ORDER — CHLORHEXIDINE GLUCONATE 4 % EX LIQD
60.0000 mL | Freq: Once | CUTANEOUS | Status: DC
Start: 2021-03-08 — End: 2021-03-09

## 2021-03-07 MED ORDER — SODIUM CHLORIDE 0.9 % IV SOLN: INTRAVENOUS | Status: DC

## 2021-03-07 MED ORDER — SODIUM CHLORIDE 0.9 % IV SOLN
80.0000 mg | INTRAVENOUS | Status: DC
  Filled 2021-03-07: qty 2

## 2021-03-07 MED ORDER — CHLORHEXIDINE GLUCONATE 4 % EX LIQD
60.0000 mL | Freq: Once | CUTANEOUS | Status: AC
  Administered 2021-03-07: 4 via TOPICAL
  Filled 2021-03-07: qty 60

## 2021-03-07 MED ORDER — GADOBUTROL 1 MMOL/ML IV SOLN
10.0000 mL | Freq: Once | INTRAVENOUS | Status: AC | PRN
  Administered 2021-03-07: 10 mL via INTRAVENOUS

## 2021-03-07 MED ORDER — CEFAZOLIN SODIUM-DEXTROSE 2-4 GM/100ML-% IV SOLN
2.0000 g | INTRAVENOUS | Status: DC
  Filled 2021-03-07: qty 100

## 2021-03-07 NOTE — Progress Notes (Addendum)
Progress Note  Patient Name: Julie James Date of Encounter: 03/07/2021  [facility] Cardiologist: Ida Rogue, MD   Subjective   No chest pain, no SOB, sitting up in chair, slept really well last night.  Inpatient Medications    Scheduled Meds: . aspirin EC  81 mg Oral Daily  . atorvastatin  40 mg Oral Daily  . chlorhexidine  15 mL Mouth/Throat BID  . heparin  5,000 Units Subcutaneous Q8H  . losartan  100 mg Oral Daily  . metoprolol succinate  50 mg Oral Daily  . pantoprazole  40 mg Oral QHS  . spironolactone  50 mg Oral Daily   Continuous Infusions:  PRN Meds: acetaminophen, clonazePAM, docusate sodium, nitroGLYCERIN, ondansetron (ZOFRAN) IV, traZODone   Vital Signs    Vitals:   03/06/21 1303 03/06/21 2117 03/07/21 0554 03/07/21 0859  BP: 136/77 (!) 153/76 127/71 139/63  Pulse: 86 71 62 72  Resp: 16 16 19 17   Temp: 98 F (36.7 C) 98.4 F (36.9 C) 98.5 F (36.9 C) 98.4 F (36.9 C)  TempSrc: Oral Oral Oral Oral  SpO2:  100% 100% 99%  Weight:   109.3 kg   Height:        Intake/Output Summary (Last 24 hours) at 03/07/2021 0906 Last data filed at 03/06/2021 1036 Gross per 24 hour  Intake 180 ml  Output --  Net 180 ml   Last 3 Weights 03/07/2021 03/06/2021 03/04/2021  Weight (lbs) 240 lb 14.4 oz 239 lb 11.2 oz 243 lb 6.4 oz  Weight (kg) 109.272 kg 108.727 kg 110.406 kg      Telemetry    SR with PVCs  - Personally Reviewed  ECG    SR with PVC and LBBB - Personally Reviewed  Physical Exam   GEN: No acute distress.   Neck: No JVD Cardiac: RRR, no murmurs, rubs, or gallops.  Respiratory: Clear to auscultation bilaterally. GI: Soft, nontender, non-distended  MS: No edema; No deformity. Neuro:  Nonfocal  Psych: Normal affect   Labs    High Sensitivity Troponin:   Recent Labs  Lab 02/27/21 1539 02/27/21 1758 02/28/21 0454 02/28/21 0720  TROPONINIHS 13 105* 502* 408*      Chemistry Recent Labs  Lab 03/02/21 6433 03/03/21 0531  03/04/21 0504 03/04/21 2155 03/05/21 0248 03/06/21 0945  NA 137 140 139  --  137 139  K 4.4 4.1 3.8  --  3.3* 4.3  CL 106 104 100  --  100 104  CO2 26 28 29   --  28 27  GLUCOSE 106* 105* 109*  --  102* 160*  BUN 11 13 14   --  15 14  CREATININE 0.74 0.84 0.94 1.02* 0.90 0.94  CALCIUM 9.0 9.5 9.4  --  9.3 9.5  PROT 6.6 7.1 7.5  --   --   --   ALBUMIN 3.4* 3.6 3.8  --   --   --   AST 43* 35 28  --   --   --   ALT 81* 59* 46*  --   --   --   ALKPHOS 66 66 64  --   --   --   BILITOT 0.8 0.8 0.8  --   --   --   GFRNONAA >60 >60 >60 >60 >60 >60  ANIONGAP 5 8 10   --  9 8     Hematology Recent Labs  Lab 03/04/21 2155 03/05/21 0248 03/06/21 0945  WBC 7.3 6.9 6.9  RBC 4.01 4.09  4.40  HGB 11.6* 11.8* 12.4  HCT 36.0 36.3 39.0  MCV 89.8 88.8 88.6  MCH 28.9 28.9 28.2  MCHC 32.2 32.5 31.8  RDW 14.0 14.1 14.1  PLT 335 326 357    BNPNo results for input(s): BNP, PROBNP in the last 168 hours.   DDimer No results for input(s): DDIMER in the last 168 hours.   Radiology    No results found.  Cardiac Studies   cath   The left ventricular systolic function is normal.  LV end diastolic pressure is normal.  The left ventricular ejection fraction is 55-65% by visual estimate.  1. Normal coronary arteries. 2. Normal LV systolic function and left ventricular end-diastolic pressure. Wall motion could not be fully evaluated.  Recommendations: No ischemic etiology for cardiac arrest. Continue evaluation by electrophysiology.  Echo 1. Left ventricular ejection fraction, by estimation, is 55 to 60%. The  left ventricle has normal function. The left ventricle has no regional  wall motion abnormalities. There is mild left ventricular hypertrophy.  Left ventricular diastolic parameters  are consistent with Grade I diastolic dysfunction (impaired relaxation).  2. Right ventricular systolic function is normal. The right ventricular  size is normal. Tricuspid regurgitation  signal is inadequate for assessing  PA pressure.  3. The mitral valve is normal in structure. No evidence of mitral valve  regurgitation. No evidence of mitral stenosis.  4. The aortic valve is normal in structure. Aortic valve regurgitation is  not visualized. Mild aortic valve sclerosis is present, with no evidence  of aortic valve stenosis.  5. The inferior vena cava is normal in size with greater than 50%  respiratory variability, suggesting right atrial pressure of 3 mmHg.  6. Challenging image quality.    Patient Profile     70 y.o. female with hx of essential hypertension who unfortunately suffered a witnessed, out of hospital cardiac arrest on 3/27. She was successfully resuscitated, and is now transferred for cMRI/secondary prevention ICD.  Assessment & Plan    Cardiac arrest - no coronary disease on cath, plan for cardiac MRI today and then secondary prevention ICD per EP prior to discharge..    Was treated with hypothermia/intubation/vent and pressure support.   Encephalopathy though resolved with waking. Oriented today.    -Echo with EF 55-60% G1DD  -on BB  -Dr. Caryl Comes with EP has seen.  -meds ASA, statin, losartan 100 BB torpol XL 50 mg aldactone and protonix.   WCT at [facility] placed on amiodarone but developed prolonged QTc so stopped.   Hypokalemia on admit with cardiac arrest repleted. HCTZ stopped. On spironolactone.  Ordered BMET for today.   For questions or updates, please contact [place] Please consult [email or web address] for contact info under        Signed, Cecilie Kicks, NP  03/07/2021, 9:06 AM    Patient seen and examined and agree with Cecilie Kicks, NP as detailed above.  In brief, the patient is a 70 year old female with history of HTN who suffered a witnessed cardiac arrest on 3/27 with successful resuscitation with cath negative for CAD now planned for cMRI and consideration of ICD placement.  Currently, patient is doing well. Has mild chest  pain from compressions but no shortness of breath, palpitations, or LE edema. Underwent MRI this AM.  GEN: No acute distress.   Neck: No JVD Cardiac: RRR, no murmurs, rubs, or gallops.  Respiratory: Clear to auscultation bilaterally. GI: Soft, nontender, non-distended  MS: No edema; No deformity. Neuro:  Nonfocal  Psych: Normal affect   Plan: -Follow-up cardiac MRI -EP consulted; plan for ICD in the AM pending MRI -Continue ASA, lipitor -Continue metop 50mg  XL daily -Continue losartan 100mg  daily  Gwyndolyn Kaufman, MD

## 2021-03-07 NOTE — Progress Notes (Signed)
  cMRI pending.   Tentatively plan on ICD tomorrow afternoon pending results and case load. Pt aware.   Explained risks, benefits, and alternatives to ICDimplantation, including but not limited to bleeding, infection, pneumothorax, pericardial effusion, lead dislodgement, heart attack, stroke, or death.  Pt verbalized understanding and agrees to proceed.   Orders written.   Legrand Como [address]" Amber, PA-C  03/07/2021 4:20 PM

## 2021-03-07 NOTE — Plan of Care (Signed)
  Problem: Education: Goal: Knowledge of General Education information will improve Description Including pain rating scale, medication(s)/side effects and non-pharmacologic comfort measures Outcome: Progressing   Problem: Health Behavior/Discharge Planning: Goal: Ability to manage health-related needs will improve Outcome: Progressing   

## 2021-03-07 NOTE — Progress Notes (Signed)
Progress Note  Patient Name: Julie James Date of Encounter: 03/07/2021  Primary Cardiologist: Ida Rogue, MD   Subjective   Without chest pain x pleuritic thought related to chest compressions Tele   Inpatient Medications    Scheduled Meds: . aspirin EC  81 mg Oral Daily  . atorvastatin  40 mg Oral Daily  . chlorhexidine  15 mL Mouth/Throat BID  . heparin  5,000 Units Subcutaneous Q8H  . losartan  100 mg Oral Daily  . metoprolol succinate  50 mg Oral Daily  . pantoprazole  40 mg Oral QHS  . spironolactone  50 mg Oral Daily   Continuous Infusions:  PRN Meds: acetaminophen, clonazePAM, docusate sodium, nitroGLYCERIN, ondansetron (ZOFRAN) IV, traZODone   Vital Signs    Vitals:   03/06/21 1303 03/06/21 2117 03/07/21 0554 03/07/21 0859  BP: 136/77 (!) 153/76 127/71 139/63  Pulse: 86 71 62 72  Resp: 16 16 19 17   Temp: 98 F (36.7 C) 98.4 F (36.9 C) 98.5 F (36.9 C) 98.4 F (36.9 C)  TempSrc: Oral Oral Oral Oral  SpO2:  100% 100% 99%  Weight:   109.3 kg   Height:        Intake/Output Summary (Last 24 hours) at 03/07/2021 0939 Last data filed at 03/06/2021 1036 Gross per 24 hour  Intake 180 ml  Output --  Net 180 ml   Filed Weights   03/04/21 2121 03/06/21 0621 03/07/21 0554  Weight: 110.4 kg 108.7 kg 109.3 kg    Telemetry    SR LBBB frequent PVCs - Personally Reviewed  ECG    SR LBBB PVCs - Personally Reviewed  Physical Exam  Well developed and nourished in no acute distress HENT normal Neck supple    Clear Regular rate and rhythm, no murmurs or gallops Abd-soft with active BS No Clubbing cyanosis edema Skin-warm and dry A & Oriented  Grossly normal sensory and motor function       Labs    Chemistry Recent Labs  Lab 03/02/21 0605 03/03/21 0531 03/04/21 0504 03/04/21 2155 03/05/21 0248 03/06/21 0945  NA 137 140 139  --  137 139  K 4.4 4.1 3.8  --  3.3* 4.3  CL 106 104 100  --  100 104  CO2 26 28 29   --  28 27  GLUCOSE  106* 105* 109*  --  102* 160*  BUN 11 13 14   --  15 14  CREATININE 0.74 0.84 0.94 1.02* 0.90 0.94  CALCIUM 9.0 9.5 9.4  --  9.3 9.5  PROT 6.6 7.1 7.5  --   --   --   ALBUMIN 3.4* 3.6 3.8  --   --   --   AST 43* 35 28  --   --   --   ALT 81* 59* 46*  --   --   --   ALKPHOS 66 66 64  --   --   --   BILITOT 0.8 0.8 0.8  --   --   --   GFRNONAA >60 >60 >60 >60 >60 >60  ANIONGAP 5 8 10   --  9 8     Hematology Recent Labs  Lab 03/04/21 2155 03/05/21 0248 03/06/21 0945  WBC 7.3 6.9 6.9  RBC 4.01 4.09 4.40  HGB 11.6* 11.8* 12.4  HCT 36.0 36.3 39.0  MCV 89.8 88.8 88.6  MCH 28.9 28.9 28.2  MCHC 32.2 32.5 31.8  RDW 14.0 14.1 14.1  PLT 335 326 357  Cardiac EnzymesNo results for input(s): TROPONINI in the last 168 hours. No results for input(s): TROPIPOC in the last 168 hours.   BNP No results for input(s): BNP, PROBNP in the last 168 hours.   DDimer No results for input(s): DDIMER in the last 168 hours.   Radiology    No results found.  Cardiac Studies  cath   The left ventricular systolic function is normal.  LV end diastolic pressure is normal.  The left ventricular ejection fraction is 55-65% by visual estimate.   1.  Normal coronary arteries. 2.  Normal LV systolic function and left ventricular end-diastolic pressure.  Wall motion could not be fully evaluated.   Recommendations: No ischemic etiology for cardiac arrest. Continue evaluation by electrophysiology.   Echo  1. Left ventricular ejection fraction, by estimation, is 55 to 60%. The  left ventricle has normal function. The left ventricle has no regional  wall motion abnormalities. There is mild left ventricular hypertrophy.  Left ventricular diastolic parameters  are consistent with Grade I diastolic dysfunction (impaired relaxation).   2. Right ventricular systolic function is normal. The right ventricular  size is normal. Tricuspid regurgitation signal is inadequate for assessing  PA pressure.    3. The mitral valve is normal in structure. No evidence of mitral valve  regurgitation. No evidence of mitral stenosis.   4. The aortic valve is normal in structure. Aortic valve regurgitation is  not visualized. Mild aortic valve sclerosis is present, with no evidence  of aortic valve stenosis.   5. The inferior vena cava is normal in size with greater than 50%  respiratory variability, suggesting right atrial pressure of 3 mmHg.   6. Challenging image quality.    Patient Profile     Julie James is a 70 y.o. female with hx of essential hypertension who unfortunately suffered a witnessed, out of hospital cardiac arrest on 3/27. She was successfully resuscitated, and is now transferred for cMRI/secondary prevention ICD.   Assessment & Plan   Principal Problem:   Cardiac arrest [facility]) Active Problems:   Hypertension   Hyperlipidemia   Hypokalemia   Await MRI  Anticipate ICD implant tomorrow prior to discharge No driving x 6 months       Signed, Virl Axe, MD  03/07/2021, 9:39 AM

## 2021-03-08 DIAGNOSIS — I10 Essential (primary) hypertension: Secondary | ICD-10-CM

## 2021-03-08 DIAGNOSIS — E785 Hyperlipidemia, unspecified: Secondary | ICD-10-CM

## 2021-03-08 LAB — SURGICAL PCR SCREEN
MRSA, PCR: NEGATIVE
Staphylococcus aureus: NEGATIVE

## 2021-03-08 MED ORDER — SODIUM CHLORIDE 0.9 % IV SOLN: INTRAVENOUS | Status: DC

## 2021-03-08 MED ORDER — CEFAZOLIN SODIUM-DEXTROSE 2-4 GM/100ML-% IV SOLN
2.0000 g | INTRAVENOUS | Status: AC
  Administered 2021-03-09: 2 g via INTRAVENOUS

## 2021-03-08 MED ORDER — CEFAZOLIN SODIUM-DEXTROSE 2-4 GM/100ML-% IV SOLN: 2.0000 g | INTRAVENOUS | Status: DC

## 2021-03-08 MED ORDER — SODIUM CHLORIDE 0.9 % IV SOLN
80.0000 mg | INTRAVENOUS | Status: AC
  Administered 2021-03-09: 80 mg

## 2021-03-08 MED ORDER — CHLORHEXIDINE GLUCONATE 4 % EX LIQD
60.0000 mL | Freq: Once | CUTANEOUS | Status: AC
  Administered 2021-03-09: 4 via TOPICAL

## 2021-03-08 MED ORDER — ATORVASTATIN CALCIUM 10 MG PO TABS
20.0000 mg | ORAL_TABLET | Freq: Every day | ORAL | Status: DC
  Administered 2021-03-09: 20 mg via ORAL
  Filled 2021-03-08: qty 2

## 2021-03-08 MED ORDER — CHLORHEXIDINE GLUCONATE 4 % EX LIQD
60.0000 mL | Freq: Once | CUTANEOUS | Status: AC
  Administered 2021-03-08: 4 via TOPICAL
  Filled 2021-03-08: qty 60

## 2021-03-08 MED ORDER — SODIUM CHLORIDE 0.9 % IV SOLN: 80.0000 mg | INTRAVENOUS | Status: DC

## 2021-03-08 NOTE — Progress Notes (Addendum)
Electrophysiology Rounding Note  Patient Name: Julie James Date of Encounter: 03/08/2021  Primary Cardiologist: Julie Rogue, MD Electrophysiologist: Julie Axe, MD   Subjective   The patient is doing well today.  At this time, the patient denies chest pain, shortness of breath, or any new concerns.  Inpatient Medications    Scheduled Meds: . aspirin EC  81 mg Oral Daily  . atorvastatin  40 mg Oral Daily  . chlorhexidine  60 mL Topical Once  . chlorhexidine  15 mL Mouth/Throat BID  . gentamicin irrigation  80 mg Irrigation On Call  . losartan  100 mg Oral Daily  . metoprolol succinate  50 mg Oral Daily  . pantoprazole  40 mg Oral QHS  . spironolactone  50 mg Oral Daily   Continuous Infusions: . sodium chloride    . sodium chloride    .  ceFAZolin (ANCEF) IV     PRN Meds: acetaminophen, clonazePAM, docusate sodium, nitroGLYCERIN, ondansetron (ZOFRAN) IV, traZODone   Vital Signs    Vitals:   03/07/21 2225 03/08/21 0621 03/08/21 0700 03/08/21 0800  BP: 134/71 136/69 140/66 140/66  Pulse: 76 74 72 72  Resp: 19 16 16 16   Temp: 97.9 F (36.6 C) 98.2 F (36.8 C) (!) 97.3 F (36.3 C) (!) 97.3 F (36.3 C)  TempSrc: Oral Oral Oral Oral  SpO2: 96% 96%    Weight:      Height:        Intake/Output Summary (Last 24 hours) at 03/08/2021 0827 Last data filed at 03/07/2021 2200 Gross per 24 hour  Intake 520 ml  Output 3 ml  Net 517 ml   Filed Weights   03/04/21 2121 03/06/21 0621 03/07/21 0554  Weight: 110.4 kg 108.7 kg 109.3 kg    Physical Exam    GEN- The patient is well appearing, alert and oriented x 3 today.   Head- normocephalic, atraumatic Eyes-  Sclera clear, conjunctiva pink Ears- hearing intact Oropharynx- clear Neck- supple Lungs- Clear to ausculation bilaterally, normal work of breathing Heart- Regular rate and rhythm, no murmurs, rubs or gallops GI- soft, NT, ND, + BS Extremities- no clubbing or cyanosis. No edema Skin- no rash or  lesion Psych- euthymic mood, full affect Neuro- strength and sensation are intact  Labs    CBC Recent Labs    03/06/21 0945  WBC 6.9  HGB 12.4  HCT 39.0  MCV 88.6  PLT 031   Basic Metabolic Panel Recent Labs    03/06/21 0945 03/07/21 0944  NA 139 138  K 4.3 4.6  CL 104 102  CO2 27 25  GLUCOSE 160* 101*  BUN 14 13  CREATININE 0.94 0.96  CALCIUM 9.5 10.0   Liver Function Tests No results for input(s): AST, ALT, ALKPHOS, BILITOT, PROT, ALBUMIN in the last 72 hours. No results for input(s): LIPASE, AMYLASE in the last 72 hours. Cardiac Enzymes No results for input(s): CKTOTAL, CKMB, CKMBINDEX, TROPONINI in the last 72 hours.   Telemetry    NSR 70s (personally reviewed)  Radiology    MR CARDIAC MORPHOLOGY W WO CONTRAST  Result Date: 03/07/2021 CLINICAL DATA:  Cardiac arrest EXAM: CARDIAC MRI TECHNIQUE: The patient was scanned on a 1.5 Tesla GE magnet. A dedicated cardiac coil was used. Functional imaging was done using Fiesta sequences. 2,3, and 4 chamber views were done to assess for RWMA's. Modified Simpson's rule using a short axis stack was used to calculate an ejection fraction on a dedicated work Conservation officer, nature.  The patient received 10 cc of Gadavist. After 10 minutes inversion recovery sequences were used to assess for infiltration and scar tissue. CONTRAST:  Gadavist 10 cc FINDINGS: Limited images of the lung fields showed no gross abnormalities. Normal left ventricular size with mild LV hypertrophy. Septal lateral dyssynchrony due to LBBB, also there appeared to be mild inferior and inferolateral hypokinesis. Overall LV EF 46%, mildly decreased. Normal left and right atrial sizes. There was an atrial septal aneurysm noted. Normal right ventricular size and systolic function, EF 59%. No significant mitral regurgitation noted. Trileaflet aortic valve with no significant stenosis or regurgitation. On delayed enhancement imaging, there was no myocardial late  gadolinium enhancement (LGE). Measurements: LVEDV 209 mL LVSV 95 mL LVEF 46% RVEDV 140 mL RVSV 69 mL RVEF 49% ECV 27% IMPRESSION: 1. Normal LV size with mild concentric LV hypertrophy. Septal-lateral dyssynchrony consistent with LBBB, also mild inferior and inferolateral hypokinesis. EF 46%. 2.  Normal RV size and systolic function, EF 93%. 3. No myocardial LGE, so no definitive evidence for prior MI, infiltrative disease, or myocarditis. Normal extracellular fluid volume percentage. Julie James Electronically Signed   By: Julie James M.D.   On: 03/07/2021 21:47   NSVT 3/28 (K 3.7 at this time)   Patient Profile     Julie James a 70 y.o.femalewith hx of essential hypertension who unfortunately suffered a witnessed, out of hospital cardiac arrest on 3/27. She was successfully resuscitated, and is now transferred for cMRI/secondary prevention ICD.  Echo 03/02/21 LVEF 55-60%, Normal RV  LHC 03/04/21  Normal coronaries, normal LV systolic function and left ventricular end-diastolic pressure. Wall motion could not be full evaluated  cMRI 03/07/21 1. Normal LV size with mild concentric LV hypertrophy. Septal-lateral dyssynchrony consistent with LBBB, also mild inferior and inferolateral hypokinesis. EF 46%.  2.  Normal RV size and systolic function, EF 57%.  3. No myocardial LGE, so no definitive evidence for prior MI, infiltrative disease, or myocarditis. Normal extracellular fluid volume percentage.  Assessment & Plan    1. S/p Cardiac arrest Work up as above unrevealing for specific cause.  She did have NSVT on tele post arrest, as above.  AED shock x 2. K 2.7 post arrest, repleted and had further NSVT with near normal K values. Explained risks, benefits, and alternatives to ICD implantation, including but not limited to bleeding, infection, pneumothorax, pericardial effusion, lead dislodgement, heart attack, stroke, or death.  Pt verbalized understanding and agrees to  proceed.   2. NICM EF normal by Echo, 46% by cMRI.  Will need general cardiology follow up as well.  Continue toprol. She states she was having "slow HRs" by pulse ox at a PCP visit several days prior to arrest. These rates are not available. She has tolerated BB without bradycardia here. ? Bradysphygmia in the setting of frequent ectopy leading up to her event. PVCs noted here.  Continue losartan  Have discussed risks and benefits of device with patient. She is aware that if urgent devices come in, she may be moved to tomorrow.   For questions or updates, please contact [place] Please consult [email or web address] for contact info under Cardiology/STEMI.  Signed, Shirley Friar, PA-C  03/08/2021, 8:27 AM     I have seen, examined the patient, and reviewed the above assessment and plan.  Changes to above are made where necessary.  On exam, RRR.  Will plan for ICD implantation for secondary prevention with Dr Caryl Comes tomorrow.  Co Sign: Thompson Grayer, MD 03/08/2021 12:47  PM   

## 2021-03-08 NOTE — Progress Notes (Addendum)
Progress Note  Patient Name: Julie James Date of Encounter: 03/08/2021  [facility] Cardiologist: Ida Rogue, MD   Subjective   Feeling good this morning. Tells me her ICD implantation is delayed until tomorrow. No complaints of chest pain, SOB, palpitations, or LE edema. Reports knee pain/swelling resolved overnight.   Inpatient Medications    Scheduled Meds: . aspirin EC  81 mg Oral Daily  . atorvastatin  40 mg Oral Daily  . chlorhexidine  60 mL Topical Once  . chlorhexidine  15 mL Mouth/Throat BID  . gentamicin irrigation  80 mg Irrigation On Call  . [START ON 03/09/2021] gentamicin irrigation  80 mg Irrigation To SS-Surg  . losartan  100 mg Oral Daily  . metoprolol succinate  50 mg Oral Daily  . pantoprazole  40 mg Oral QHS  . spironolactone  50 mg Oral Daily   Continuous Infusions: . sodium chloride Stopped (03/08/21 1051)  . sodium chloride Stopped (03/08/21 1051)  .  ceFAZolin (ANCEF) IV    . [START ON 03/09/2021]  ceFAZolin (ANCEF) IV     PRN Meds: acetaminophen, clonazePAM, docusate sodium, nitroGLYCERIN, ondansetron (ZOFRAN) IV, traZODone   Vital Signs    Vitals:   03/07/21 2225 03/08/21 0621 03/08/21 0700 03/08/21 0800  BP: 134/71 136/69  140/66  Pulse: 76 74 72 72  Resp: 19 16 16 16   Temp: 97.9 F (36.6 C) 98.2 F (36.8 C)  97.6 F (36.4 C)  TempSrc: Oral Oral  Oral  SpO2: 96% 96%    Weight:      Height:        Intake/Output Summary (Last 24 hours) at 03/08/2021 1110 Last data filed at 03/07/2021 2200 Gross per 24 hour  Intake 520 ml  Output 3 ml  Net 517 ml   Last 3 Weights 03/07/2021 03/06/2021 03/04/2021  Weight (lbs) 240 lb 14.4 oz 239 lb 11.2 oz 243 lb 6.4 oz  Weight (kg) 109.272 kg 108.727 kg 110.406 kg      Telemetry    Sinus rhythm with episode of bigeminy and occasional isolated PVCs - Personally Reviewed  ECG    No new tracings - Personally Reviewed  Physical Exam   GEN: No acute distress.   Neck: No JVD Cardiac: RRR,  no murmurs, rubs, or gallops.  Respiratory: Clear to auscultation bilaterally. GI: Soft, obese, nontender, non-distended  MS: No edema; No deformity. Neuro:  Nonfocal  Psych: Normal affect   Labs    High Sensitivity Troponin:   Recent Labs  Lab 02/27/21 1539 02/27/21 1758 02/28/21 0454 02/28/21 0720  TROPONINIHS 13 105* 502* 408*      Chemistry Recent Labs  Lab 03/02/21 9924 03/03/21 0531 03/04/21 0504 03/04/21 2155 03/05/21 0248 03/06/21 0945 03/07/21 0944  NA 137 140 139  --  137 139 138  K 4.4 4.1 3.8  --  3.3* 4.3 4.6  CL 106 104 100  --  100 104 102  CO2 26 28 29   --  28 27 25   GLUCOSE 106* 105* 109*  --  102* 160* 101*  BUN 11 13 14   --  15 14 13   CREATININE 0.74 0.84 0.94   < > 0.90 0.94 0.96  CALCIUM 9.0 9.5 9.4  --  9.3 9.5 10.0  PROT 6.6 7.1 7.5  --   --   --   --   ALBUMIN 3.4* 3.6 3.8  --   --   --   --   AST 43* 35 28  --   --   --   --  ALT 81* 59* 46*  --   --   --   --   ALKPHOS 66 66 64  --   --   --   --   BILITOT 0.8 0.8 0.8  --   --   --   --   GFRNONAA >60 >60 >60   < > >60 >60 >60  ANIONGAP 5 8 10   --  9 8 11    < > = values in this interval not displayed.     Hematology Recent Labs  Lab 03/04/21 2155 03/05/21 0248 03/06/21 0945  WBC 7.3 6.9 6.9  RBC 4.01 4.09 4.40  HGB 11.6* 11.8* 12.4  HCT 36.0 36.3 39.0  MCV 89.8 88.8 88.6  MCH 28.9 28.9 28.2  MCHC 32.2 32.5 31.8  RDW 14.0 14.1 14.1  PLT 335 326 357    BNPNo results for input(s): BNP, PROBNP in the last 168 hours.   DDimer No results for input(s): DDIMER in the last 168 hours.   Radiology    MR CARDIAC MORPHOLOGY W WO CONTRAST  Result Date: 03/07/2021 CLINICAL DATA:  Cardiac arrest EXAM: CARDIAC MRI TECHNIQUE: The patient was scanned on a 1.5 Tesla GE magnet. A dedicated cardiac coil was used. Functional imaging was done using Fiesta sequences. 2,3, and 4 chamber views were done to assess for RWMA's. Modified Simpson's rule using a short axis stack was used to calculate  an ejection fraction on a dedicated work Conservation officer, nature. The patient received 10 cc of Gadavist. After 10 minutes inversion recovery sequences were used to assess for infiltration and scar tissue. CONTRAST:  Gadavist 10 cc FINDINGS: Limited images of the lung fields showed no gross abnormalities. Normal left ventricular size with mild LV hypertrophy. Septal lateral dyssynchrony due to LBBB, also there appeared to be mild inferior and inferolateral hypokinesis. Overall LV EF 46%, mildly decreased. Normal left and right atrial sizes. There was an atrial septal aneurysm noted. Normal right ventricular size and systolic function, EF 08%. No significant mitral regurgitation noted. Trileaflet aortic valve with no significant stenosis or regurgitation. On delayed enhancement imaging, there was no myocardial late gadolinium enhancement (LGE). Measurements: LVEDV 209 mL LVSV 95 mL LVEF 46% RVEDV 140 mL RVSV 69 mL RVEF 49% ECV 27% IMPRESSION: 1. Normal LV size with mild concentric LV hypertrophy. Septal-lateral dyssynchrony consistent with LBBB, also mild inferior and inferolateral hypokinesis. EF 46%. 2.  Normal RV size and systolic function, EF 14%. 3. No myocardial LGE, so no definitive evidence for prior MI, infiltrative disease, or myocarditis. Normal extracellular fluid volume percentage. Dalton Mclean Electronically Signed   By: Loralie Champagne M.D.   On: 03/07/2021 21:47    Cardiac Studies   Cardiac MRI 03/07/21: IMPRESSION: 1. Normal LV size with mild concentric LV hypertrophy. Septal-lateral dyssynchrony consistent with LBBB, also mild inferior and inferolateral hypokinesis. EF 46%.  2.  Normal RV size and systolic function, EF 48%.  3. No myocardial LGE, so no definitive evidence for prior MI, infiltrative disease, or myocarditis. Normal extracellular fluid volume percentage.  Left heart catheterization 03/04/21:   The left ventricular systolic function is normal.  LV end  diastolic pressure is normal.  The left ventricular ejection fraction is 55-65% by visual estimate.  1. Normal coronary arteries. 2. Normal LV systolic function and left ventricular end-diastolic pressure. Wall motion could not be fully evaluated.  Recommendations: No ischemic etiology for cardiac arrest. Continue evaluation by electrophysiology.  Echo 03/02/21: 1. Left ventricular ejection fraction, by estimation,  is 55 to 60%. The  left ventricle has normal function. The left ventricle has no regional  wall motion abnormalities. There is mild left ventricular hypertrophy.  Left ventricular diastolic parameters  are consistent with Grade I diastolic dysfunction (impaired relaxation).  2. Right ventricular systolic function is normal. The right ventricular  size is normal. Tricuspid regurgitation signal is inadequate for assessing  PA pressure.  3. The mitral valve is normal in structure. No evidence of mitral valve  regurgitation. No evidence of mitral stenosis.  4. The aortic valve is normal in structure. Aortic valve regurgitation is  not visualized. Mild aortic valve sclerosis is present, with no evidence  of aortic valve stenosis.  5. The inferior vena cava is normal in size with greater than 50%  respiratory variability, suggesting right atrial pressure of 3 mmHg.  6. Challenging image quality.   Patient Profile     70 y.o. female with a PMH of HTN, who presented with witnessed out of hospital cardiac arrest 02/27/21 with successful resuscitation, transferred to [facility] for further management.  Assessment & Plan    1. Post-cardiac arrest: patient presented with out of hospital witnessed arrest 02/27/21, s/p CPR and AED shock x2 with successful resuscitation. K down to 2.7 post-arrest. She was intubated and underwent cooling protocol. Echo 03/02/21 showed EF 55-60%, G1DD, no RWMA, and no significant valvular abnormalities. She underwent a LHC 03/04/21 which showed normal  coronary arteries. EP evaluated patient recommended cardiac MR which was without evidence of scarring. Tentatively scheduled for ICD implantation tomorrow for secondary prevention given unrevealing etiology of her cardiac arrest.  - NPO after MN for ICD implantation tomorrow - Continue metoprolol - Continue to monitor electrolytes and replete as needed to maintain K >4, Mg >2  2. HTN: BP generally stable - Continue metoprolol, losartan, and spironolactone  3. HLD: LDL 106 this admission. She was started on atorvastatin in the setting of post-arrest. LHC showed normal coronary arteries.  - Continue atorvastatin - will reduce dose to 20mg  daily for goal LDL <100 for CAD prevention - Repeat FLP/LFTs in 6-8 weeks.   4. Pre-DM type 2: A1C 5.9 this admission - Continue dietary/lifestyle modifications to prevent progression     For questions or updates, please contact [place] Please consult [email or web address] for contact info under   Signed, Abigail Butts, PA-C  03/08/2021, 11:10 AM     Patient seen and examined and agree with Roby Lofts, PA-C as detailed above.  In brief, the patient is a 70 year old female with history of HTN who suffered a witnessed cardiac arrest on 3/27 with successful resuscitation with cath negative for CAD. Cardiac MRI with no LGE (prior MI, infiltrative disease or myocarditis), LVEF 46%, RVEF 49%, ECV 27%. Now planned for ICD tomorrow.  Currently, patient is doing well. Planned for ICD tomorrow.  GEN:No acute distress.   Neck:No JVD Cardiac:RRR, no murmurs, rubs, or gallops.  Respiratory:Clear to auscultation bilaterally. ER:XVQM, nontender, non-distended  MS:No edema; No deformity. Neuro:Nonfocal  Psych: Normal affect   Plan: -NPO at MN for ICD tomorrow -Cath without obstructive disease -Cardiac MRI unremarkable with no LGE, scarring, evidence of infiltrative process -Continue ASA, lipitor -Continue metop 50mg  XL daily -Continue  losartan 100mg  daily  Gwyndolyn Kaufman, MD

## 2021-03-08 NOTE — Care Management Important Message (Signed)
Important Message  Patient Details  Name: Baylei Siebels MRN: [id number] Date of Birth: [birth year]   Medicare Important Message Given:  Yes     Shelda Altes 03/08/2021, 12:24 PM

## 2021-03-09 ENCOUNTER — Encounter (HOSPITAL_COMMUNITY)
Admission: AD | Disposition: A | Discharge status: Home / Self Care | Payer: Self-pay | Source: Other Acute Inpatient Hospital | Attending: Internal Medicine

## 2021-03-09 ENCOUNTER — Inpatient Hospital Stay (HOSPITAL_COMMUNITY): Payer: Medicare Other

## 2021-03-09 ENCOUNTER — Other Ambulatory Visit (HOSPITAL_COMMUNITY): Payer: Self-pay

## 2021-03-09 ENCOUNTER — Inpatient Hospital Stay (HOSPITAL_COMMUNITY)
Admission: AD | Disposition: A | Discharge status: Home / Self Care | Payer: Self-pay | Source: Other Acute Inpatient Hospital | Attending: Internal Medicine

## 2021-03-09 DIAGNOSIS — I428 Other cardiomyopathies: Secondary | ICD-10-CM

## 2021-03-09 HISTORY — PX: ICD IMPLANT: EP1208

## 2021-03-09 IMAGING — CR DG CHEST 2V
3 series · 3 of 3 positions shown · non-contrast
Comparison: [DATE]

CLINICAL DATA: Cardiac device placement

EXAM:
CHEST - 2 VIEW

[chest lat (1 of 2)]
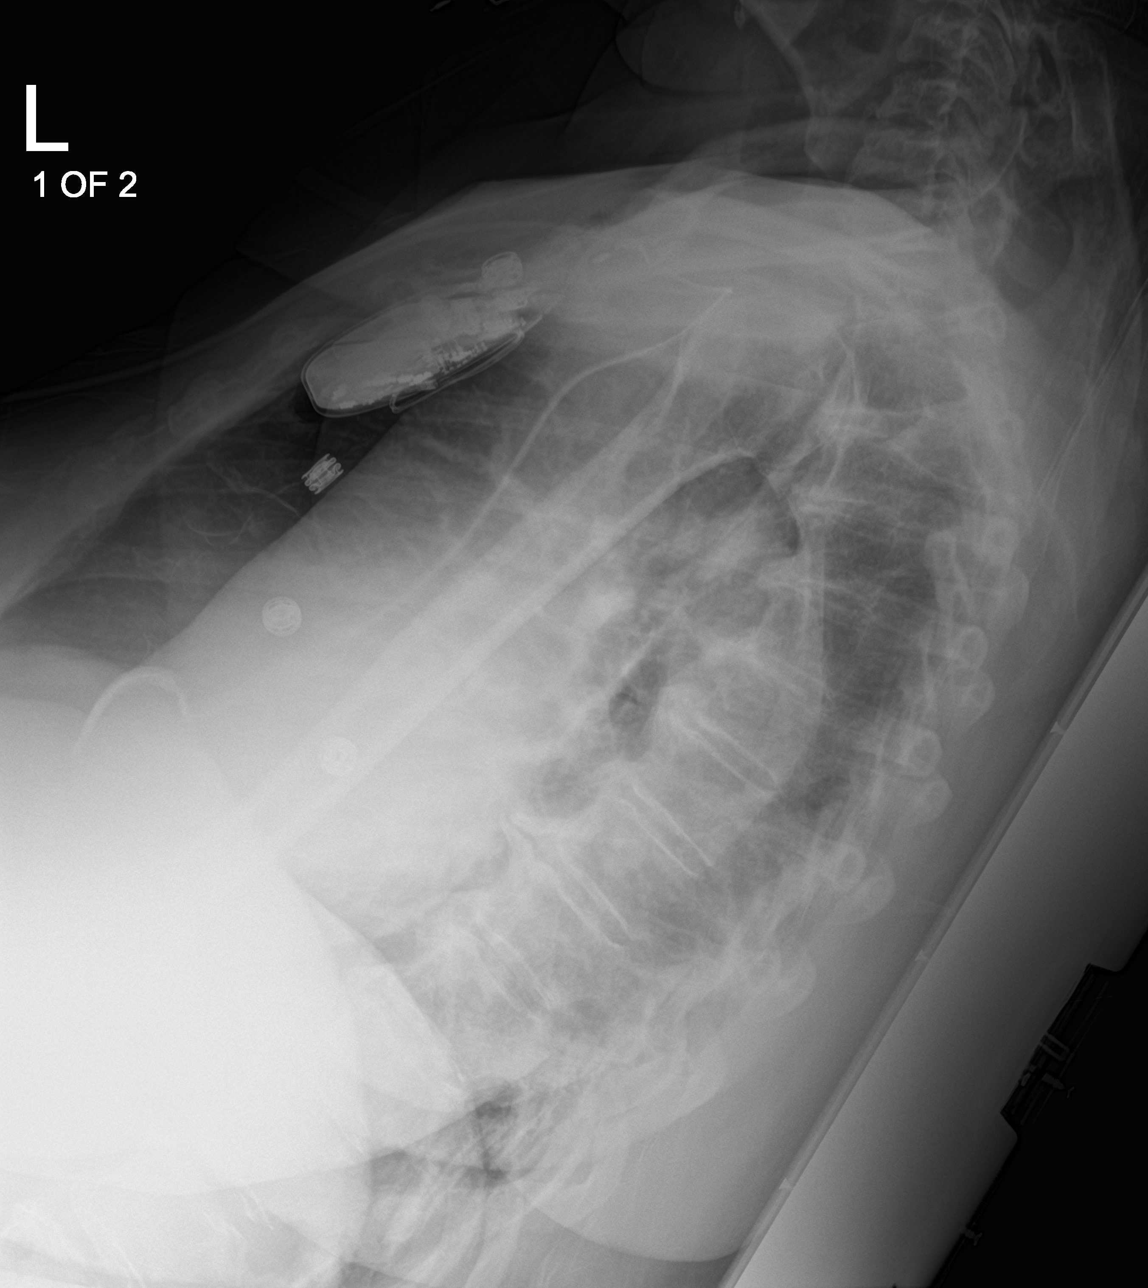

[chest ap]
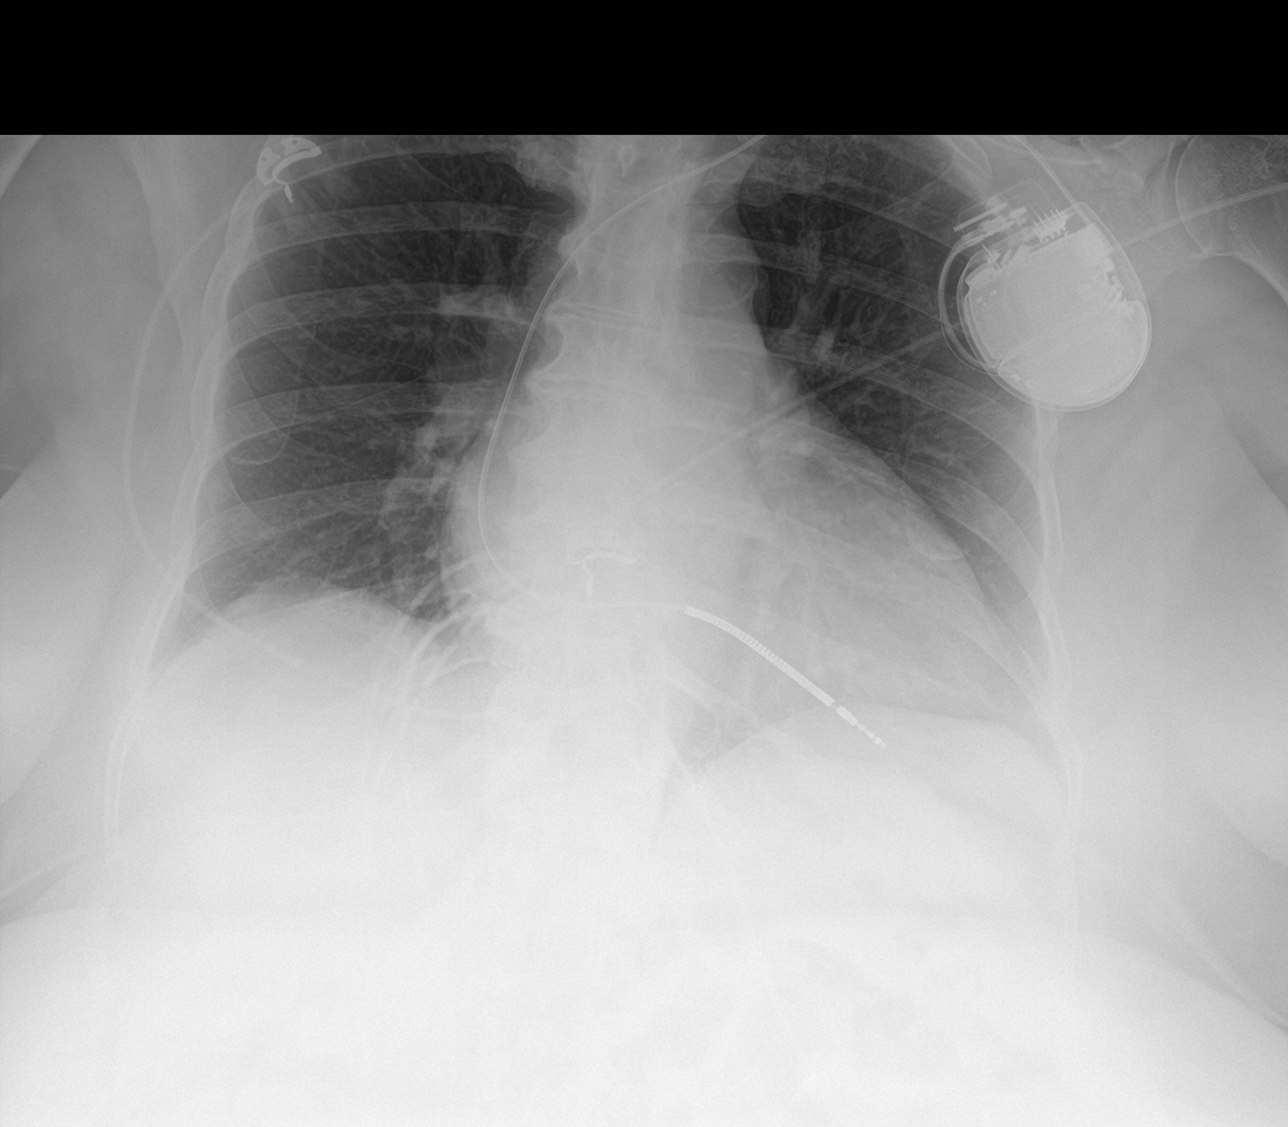

[chest lat (2 of 2)]
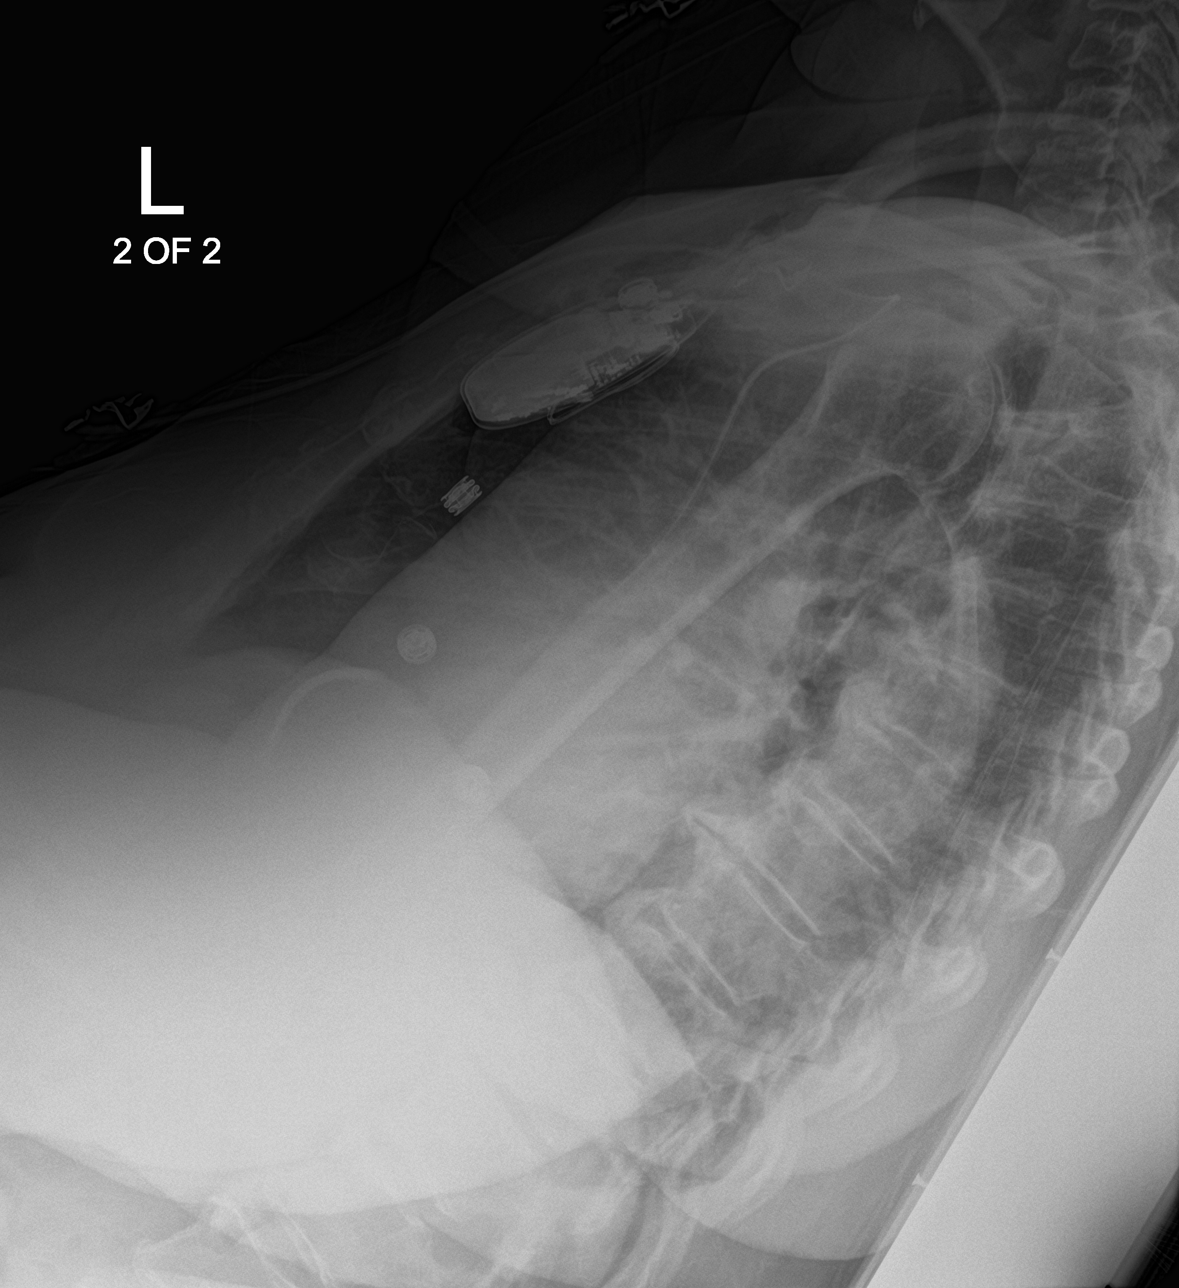

[3 of 3 positions shown; findings below may reference images not displayed]

FINDINGS: The heart size and mediastinal contours are mildly enlarged. There
is prominence of the central pulmonary vasculature. A left-sided
pacemaker seen with the lead tips in the right ventricle. No acute
osseous abnormality.
IMPRESSION: Mild cardiomegaly and pulmonary vascular congestion

Interval placement of a left-sided pacemaker

## 2021-03-09 SURGERY — ICD IMPLANT

## 2021-03-09 MED ORDER — CEFAZOLIN SODIUM-DEXTROSE 2-4 GM/100ML-% IV SOLN
INTRAVENOUS | Status: AC
  Filled 2021-03-09: qty 100

## 2021-03-09 MED ORDER — SODIUM CHLORIDE 0.9 % IV SOLN
INTRAVENOUS | Status: AC
  Filled 2021-03-09: qty 2

## 2021-03-09 MED ORDER — HEPARIN (PORCINE) IN NACL 1000-0.9 UT/500ML-% IV SOLN
INTRAVENOUS | Status: DC | PRN
  Administered 2021-03-09: 500 mL

## 2021-03-09 MED ORDER — CEFAZOLIN SODIUM-DEXTROSE 1-4 GM/50ML-% IV SOLN
1.0000 g | Freq: Four times a day (QID) | INTRAVENOUS | Status: AC
  Administered 2021-03-09: 1 g via INTRAVENOUS
  Filled 2021-03-09: qty 50

## 2021-03-09 MED ORDER — ATORVASTATIN CALCIUM 20 MG PO TABS
20.0000 mg | ORAL_TABLET | Freq: Every day | ORAL | 3 refills | Status: DC
  Filled 2021-03-09: qty 30, 30d supply, fill #0

## 2021-03-09 MED ORDER — ACETAMINOPHEN 325 MG PO TABS
325.0000 mg | ORAL_TABLET | ORAL | Status: DC | PRN
  Administered 2021-03-09: 650 mg via ORAL
  Filled 2021-03-09: qty 2

## 2021-03-09 MED ORDER — FENTANYL CITRATE (PF) 100 MCG/2ML IJ SOLN
INTRAMUSCULAR | Status: AC
  Filled 2021-03-09: qty 2

## 2021-03-09 MED ORDER — FENTANYL CITRATE (PF) 100 MCG/2ML IJ SOLN
INTRAMUSCULAR | Status: DC | PRN
  Administered 2021-03-09: 25 ug via INTRAVENOUS
  Administered 2021-03-09: 50 ug via INTRAVENOUS

## 2021-03-09 MED ORDER — LIDOCAINE HCL (PF) 1 % IJ SOLN
INTRAMUSCULAR | Status: DC | PRN
  Administered 2021-03-09: 30 mL

## 2021-03-09 MED ORDER — SPIRONOLACTONE 50 MG PO TABS
50.0000 mg | ORAL_TABLET | Freq: Every day | ORAL | 3 refills | Status: DC
  Filled 2021-03-09: qty 30, 30d supply, fill #0

## 2021-03-09 MED ORDER — ONDANSETRON HCL 4 MG/2ML IJ SOLN: 4.0000 mg | Freq: Four times a day (QID) | INTRAMUSCULAR | Status: DC | PRN

## 2021-03-09 MED ORDER — MIDAZOLAM HCL 5 MG/5ML IJ SOLN
INTRAMUSCULAR | Status: AC
  Filled 2021-03-09: qty 5

## 2021-03-09 MED ORDER — MIDAZOLAM HCL 5 MG/5ML IJ SOLN
INTRAMUSCULAR | Status: DC | PRN
  Administered 2021-03-09: 2 mg via INTRAVENOUS
  Administered 2021-03-09: 1 mg via INTRAVENOUS

## 2021-03-09 MED ORDER — LOSARTAN POTASSIUM 100 MG PO TABS
100.0000 mg | ORAL_TABLET | Freq: Every day | ORAL | 3 refills | Status: DC
  Filled 2021-03-09: qty 30, 30d supply, fill #0

## 2021-03-09 MED ORDER — ASPIRIN 81 MG PO TBEC
81.0000 mg | DELAYED_RELEASE_TABLET | Freq: Every day | ORAL | 11 refills | Status: AC
  Filled 2021-03-09: qty 30, 30d supply, fill #0

## 2021-03-09 MED ORDER — HEPARIN (PORCINE) IN NACL 1000-0.9 UT/500ML-% IV SOLN
INTRAVENOUS | Status: AC
  Filled 2021-03-09: qty 500

## 2021-03-09 MED ORDER — LIDOCAINE HCL 1 % IJ SOLN
INTRAMUSCULAR | Status: AC
  Filled 2021-03-09: qty 60

## 2021-03-09 MED ORDER — PANTOPRAZOLE SODIUM 40 MG PO TBEC
40.0000 mg | DELAYED_RELEASE_TABLET | Freq: Every day | ORAL | 3 refills | Status: DC
  Filled 2021-03-09: qty 30, 30d supply, fill #0

## 2021-03-09 SURGICAL SUPPLY — 7 items
CABLE SURGICAL S-101-97-12 (CABLE) ×2 IMPLANT
HEMOSTAT SURGICEL 2X4 FIBR (HEMOSTASIS) ×2 IMPLANT
ICD ELLIPSE VR CD1411-36C (ICD Generator) ×2 IMPLANT
LEAD DURATA 7122-65CM (Lead) ×2 IMPLANT
PAD PRO RADIOLUCENT 2001M-C (PAD) ×2 IMPLANT
SHEATH 7FR PRELUDE SNAP 13 (SHEATH) ×2 IMPLANT
TRAY PACEMAKER INSERTION (PACKS) ×2 IMPLANT

## 2021-03-09 NOTE — Discharge Instructions (Signed)
After Your ICD (Implantable Cardiac Defibrillator)   . You have a [employer] ICD  ACTIVITY . Do not lift your arm above shoulder height for 1 week after your procedure. After 7 days, you may progress as below.  . You should remove your sling 24 hours after your procedure, unless otherwise instructed by your provider.     Wednesday March 16, 2021  Thursday March 17, 2021 Friday March 18, 2021 Saturday March 19, 2021   . Do not lift, push, pull, or carry anything over 10 pounds with the affected arm until 6 weeks (Wednesday Apr 20, 2021 ) after your procedure.   . Do NOT DRIVE until you have been seen for your wound check, or as long as instructed by your healthcare provider.   . Ask your healthcare provider when you can go back to work   INCISION/Dressing . If you are on a blood thinner such as Coumadin, Xarelto, Eliquis, Plavix, or Pradaxa please confirm with your provider when this should be resumed.   . Monitor your defibrillator site for redness, swelling, and drainage. Call the device clinic at [phone] if you experience these symptoms or fever/chills.  . If your incision is closed with Dermabond/Surgical glue. You may shower 1 day after your pacemaker implant and wash around the site with soap and water.    Marland Kitchen Avoid lotions, ointments, or perfumes over your incision until it is well-healed.  . You may use a hot tub or a pool AFTER your wound check appointment if the incision is completely closed.  . Your ICD is designed to protect you from life threatening heart rhythms. Because of this, you may receive a shock.   o 1 shock with no symptoms:  Call the office during business hours. o 1 shock with symptoms (chest pain, chest pressure, dizziness, lightheadedness, shortness of breath, overall feeling unwell):  Call 911. o If you experience 2 or more shocks in 24 hours:  Call 911. o If you receive a shock, you should not drive for 6 months per the [place] DMV IF you receive  appropriate therapy from your ICD.   . ICD Alerts:  Some alerts are vibratory and others beep. These are NOT emergencies. Please call our office to let us know. If this occurs at night or on weekends, it can wait until the next business day. Send a remote transmission.  . If your device is capable of reading fluid status (for heart failure), you will be offered monthly monitoring to review this with you.   DEVICE MANAGEMENT . Remote monitoring is used to monitor your ICD from home. This monitoring is scheduled every 91 days by our office. It allows [place] to keep an eye on the functioning of your device to ensure it is working properly. You will routinely see your Electrophysiologist annually (more often if necessary).   . You should receive your ID card for your new device in 4-8 weeks. Keep this card with you at all times once received. Consider wearing a medical alert bracelet or necklace.  . Your ICD  may be MRI compatible. This will be discussed at your next office visit/wound check.  You should avoid contact with strong electric or magnetic fields.    Do not use amateur (ham) radio equipment or electric (arc) welding torches. MP3 player headphones with magnets should not be used. Some devices are safe to use if held at least 12 inches (30 cm) from your defibrillator. These include power tools, lawn mowers, and  speakers. If you are unsure if something is safe to use, ask your health care provider.   When using your cell phone, hold it to the ear that is on the opposite side from the defibrillator. Do not leave your cell phone in a pocket over the defibrillator.   You may safely use electric blankets, heating pads, computers, and microwave ovens.  Call the office right away if:  You have chest pain.  You feel more than one shock.  You feel more short of breath than you have felt before.  You feel more light-headed than you have felt before.  Your incision starts to open up.  This  information is not intended to replace advice given to you by your health care provider. Make sure you discuss any questions you have with your health care provider.   PLEASE REMEMBER TO BRING ALL OF YOUR MEDICATIONS TO EACH OF YOUR FOLLOW-UP OFFICE VISITS.  PLEASE ATTEND ALL SCHEDULED FOLLOW-UP APPOINTMENTS.   Activity: Increase activity slowly as tolerated. You may shower, but no soaking baths (or swimming) for 1 week. No driving for 24 hours. No lifting over 5 lbs for 1 week. No sexual activity for 1 week.   You May Return to Work: in 1 week (if applicable)  Wound Care: You may wash cath site gently with soap and water. Keep cath site clean and dry. If you notice pain, swelling, bleeding or pus at your cath site, please call [phone].   Heart-Healthy Eating Plan Heart-healthy meal planning includes:  Eating less unhealthy fats.  Eating more healthy fats.  Making other changes in your diet. Talk with your doctor or a diet specialist (dietitian) to create an eating plan that is right for you. What is my plan? Your doctor may recommend an eating plan that includes:  Total fat: ______% or less of total calories a day.  Saturated fat: ______% or less of total calories a day.  Cholesterol: less than _________mg a day. What are tips for following this plan? Cooking Avoid frying your food. Try to bake, boil, grill, or broil it instead. You can also reduce fat by:  Removing the skin from poultry.  Removing all visible fats from meats.  Steaming vegetables in water or broth. Meal planning  At meals, divide your plate into four equal parts: ? Fill one-half of your plate with vegetables and green salads. ? Fill one-fourth of your plate with whole grains. ? Fill one-fourth of your plate with lean protein foods.  Eat 4-5 servings of vegetables per day. A serving of vegetables is: ? 1 cup of raw or cooked vegetables. ? 2 cups of raw leafy greens.  Eat 4-5 servings of fruit  per day. A serving of fruit is: ? 1 medium whole fruit. ?  cup of dried fruit. ?  cup of fresh, frozen, or canned fruit. ?  cup of 100% fruit juice.  Eat more foods that have soluble fiber. These are apples, broccoli, carrots, beans, peas, and barley. Try to get 20-30 g of fiber per day.  Eat 4-5 servings of nuts, legumes, and seeds per week: ? 1 serving of dried beans or legumes equals  cup after being cooked. ? 1 serving of nuts is  cup. ? 1 serving of seeds equals 1 tablespoon.   General information  Eat more home-cooked food. Eat less restaurant, buffet, and fast food.  Limit or avoid alcohol.  Limit foods that are high in starch and sugar.  Avoid fried foods.  Lose  weight if you are overweight.  Keep track of how much salt (sodium) you eat. This is important if you have high blood pressure. Ask your doctor to tell you more about this.  Try to add vegetarian meals each week. Fats  Choose healthy fats. These include olive oil and canola oil, flaxseeds, walnuts, almonds, and seeds.  Eat more omega-3 fats. These include salmon, mackerel, sardines, tuna, flaxseed oil, and ground flaxseeds. Try to eat fish at least 2 times each week.  Check food labels. Avoid foods with trans fats or high amounts of saturated fat.  Limit saturated fats. ? These are often found in animal products, such as meats, butter, and cream. ? These are also found in plant foods, such as palm oil, palm kernel oil, and coconut oil.  Avoid foods with partially hydrogenated oils in them. These have trans fats. Examples are stick margarine, some tub margarines, cookies, crackers, and other baked goods. What foods can I eat? Fruits All fresh, canned (in natural juice), or frozen fruits. Vegetables Fresh or frozen vegetables (raw, steamed, roasted, or grilled). Green salads. Grains Most grains. Choose whole wheat and whole grains most of the time. Rice and pasta, including brown rice and pastas made  with whole wheat. Meats and other proteins Lean, well-trimmed beef, veal, pork, and lamb. Chicken and [place] without skin. All fish and shellfish. Wild duck, rabbit, pheasant, and venison. Egg whites or low-cholesterol egg substitutes. Dried beans, peas, lentils, and tofu. Seeds and most nuts. Dairy Low-fat or nonfat cheeses, including ricotta and mozzarella. Skim or 1% milk that is liquid, powdered, or evaporated. Buttermilk that is made with low-fat milk. Nonfat or low-fat yogurt. Fats and oils Non-hydrogenated (trans-free) margarines. Vegetable oils, including soybean, sesame, sunflower, olive, peanut, safflower, corn, canola, and cottonseed. Salad dressings or mayonnaise made with a vegetable oil. Beverages Mineral water. Coffee and tea. Diet carbonated beverages. Sweets and desserts Sherbet, gelatin, and fruit ice. Small amounts of dark chocolate. Limit all sweets and desserts. Seasonings and condiments All seasonings and condiments. The items listed above may not be a complete list of foods and drinks you can eat. Contact a dietitian for more options. What foods should I avoid? Fruits Canned fruit in heavy syrup. Fruit in cream or butter sauce. Fried fruit. Limit coconut. Vegetables Vegetables cooked in cheese, cream, or butter sauce. Fried vegetables. Grains Breads that are made with saturated or trans fats, oils, or whole milk. Croissants. Sweet rolls. Donuts. High-fat crackers, such as cheese crackers. Meats and other proteins Fatty meats, such as hot dogs, ribs, sausage, bacon, rib-eye roast or steak. High-fat deli meats, such as salami and bologna. Caviar. Domestic duck and goose. Organ meats, such as liver. Dairy Cream, sour cream, cream cheese, and creamed cottage cheese. Whole-milk cheeses. Whole or 2% milk that is liquid, evaporated, or condensed. Whole buttermilk. Cream sauce or high-fat cheese sauce. Yogurt that is made from whole milk. Fats and oils Meat fat, or  shortening. Cocoa butter, hydrogenated oils, palm oil, coconut oil, palm kernel oil. Solid fats and shortenings, including bacon fat, salt pork, lard, and butter. Nondairy cream substitutes. Salad dressings with cheese or sour cream. Beverages Regular sodas and juice drinks with added sugar. Sweets and desserts Frosting. Pudding. Cookies. Cakes. Pies. Milk chocolate or white chocolate. Buttered syrups. Full-fat ice cream or ice cream drinks. The items listed above may not be a complete list of foods and drinks to avoid. Contact a dietitian for more information. Summary  Heart-healthy meal planning includes eating  less unhealthy fats, eating more healthy fats, and making other changes in your diet.  Eat a balanced diet. This includes fruits and vegetables, low-fat or nonfat dairy, lean protein, nuts and legumes, whole grains, and heart-healthy oils and fats. This information is not intended to replace advice given to you by your health care provider. Make sure you discuss any questions you have with your health care provider. Document Revised: 01/24/2018 Document Reviewed: 12/28/2017 Elsevier Patient Education  2021 [employer].

## 2021-03-09 NOTE — Plan of Care (Signed)

## 2021-03-09 NOTE — Progress Notes (Signed)
CXR reviewed, no sign of pneumothorax or immediate post op complication.

## 2021-03-09 NOTE — Discharge Summary (Addendum)
ELECTROPHYSIOLOGY PROCEDURE DISCHARGE SUMMARY    Patient ID: Julie James,  MRN: [id number], DOB/AGE: [birth year] 68 y.o.  Admit date: 03/04/2021 Discharge date: 03/09/2021  Primary Care Physician: Verl Bangs, FNP  Primary Cardiologist: Ida Rogue, MD  Electrophysiologist: Virl Axe, MD   Primary Diagnosis:  Aborted Cardiac Arrest Chronic systolic CHF   Secondary Diagnosis: HTN HLD Pre-diabetes  Allergies  Allergen Reactions   Chlorthalidone Other (See Comments)    hypokalemia     Procedures This Admission:  1. Echo 03/02/21 with LVEF 55-60%, Normal RV 2. L/RHC 03/04/21 by Dr. Audelia Acton with normal coronaries and normal LV systolic function and LVEDP 3. cMRI 03/07/21 showed normal LV size with mild concentric hypertrophy, septal-lateral dyssynchrony consistent with LBBB, EF 46%. No myocardial LGE 4.  Implantation of a St. Jude single chamber ICD on 03/09/21 by Dr. Caryl Comes.  The patient received a St Jude model number [id number] ICD with model number [phone] right ventricular lead. DFT's were deferred at time of implant.  There were no immediate post procedure complications. 5.  CXR on 03/09/21 demonstrated no pneumothorax status post device implantation.  Brief HPI: Julie James is a 70 y.o. female was admitted for cardiac arrest and consulted by electrophysiology  for consideration of ICD implantation.  Past medical history includes above.  The patient has persistent LV dysfunction despite guideline directed therapy.  Risks, benefits, and alternatives to ICD implantation were reviewed with the patient who wished to proceed.   Hospital Course:   Ms. Schiffer was admitted 02/27/21, with witnessed and aborted cardiac arrest s/p immediate CPR. 911 was called and on fire/rescue arrival, patient was reportedly in a shockable rhythm.  She received 2 shocks.  On EMS arrival, she had a pulse and was in what appeared to be a normal sinus rhythm.  Code STEMI was  activated due to concern for anterior precordial lead elevations.  En route, she was minimally responsive.  She arrived to the ED with pulses, unresponsive.  Code STEMI was cancelled by Dr. Lance Sell.  She was intubated in the emergency room due to being unresponsive.  In the ED, she developed WCT and was started on amiodarone which was subsequently stopped due to bradycardia.  She was consulted on by Dr. Clayborn Bigness with recommendation for echo. He did not recommend cardiac cath at that time as there was concern for possible anoxic brain injury. HS-Tn initially 13 with a delta of 105, ultimately peaking at 502 and down trending. Echo on 03/02/2021 showed an EF of 55-60%, no RWMA, mild LVH, Gr1DD, normal RVSF and ventricular cavity size, and mild aortic valve sclerosis without evidence of stenosis. MRI brain showed no acute intracranial process. She was treated with IV heparin for 48 hours. Initial potassium was low at 2.7, which was repleted. Magnesium 2.0.  As outlined above, there were initial concerns for anoxic brain injury, though she has subsequently had return of near normal cognitive function.    [facility] EP was asked to evaluate the patient at the request of Dr. Manuella Ghazi on 3/31. Dr. Caryl Comes evaluated her on 3/31 with recommendation to proceed with cardiac cath followed by transfer to [facility] for cMRI and ICD. Her hypokalemia was not felt to be a contributing factor to her arrest, as this was 4.1 five days prior to her arrest, and was felt to be a consequence of her cardiac arrest. Initially, she was not inclined to pursue any cardiac workup, though after she had discussion with her family, she wanted  to move forward with cardiac testing and treatment. There is no family history of sudden cardiac death.   Echo with normal EF. LHC with normal LVEDP and normal coronaries. cMRI with EF 46% and no LGE.    With no reversible cause for her cardiac arrest identified, he patient underwent implantation of a  St. Jude single chamber ICD with details as outlined above.   Left chest was without hematoma or ecchymosis.  The device was interrogated and found to be functioning normally.  CXR was obtained and demonstrated no pneumothorax status post device implantation..  Wound care, arm mobility, and restrictions were reviewed with the patient.  The patient was examined and considered stable for discharge to home.   The patient's discharge medications include an ACE-I/ARB/ARNI (Losartan) and beta blocker (Toprol).   Anticoagulation resumption This patient is not on anticoagulation.  Physical Exam: Vitals:   03/09/21 1236 03/09/21 1241 03/09/21 1246 03/09/21 1251  BP: 126/65 129/66 123/74 125/60  Pulse: 69 (!) 59 72 76  Resp: 19 (!) 23 (!) 25 14  Temp:      TempSrc:      SpO2: 100% 100% 100% 100%  Weight:      Height:        Labs:   Lab Results  Component Value Date   WBC 6.9 03/06/2021   HGB 12.4 03/06/2021   HCT 39.0 03/06/2021   MCV 88.6 03/06/2021   PLT 357 03/06/2021    Recent Labs  Lab 03/04/21 0504 03/04/21 2155 03/07/21 0944  NA 139   < > 138  K 3.8   < > 4.6  CL 100   < > 102  CO2 29   < > 25  BUN 14   < > 13  CREATININE 0.94   < > 0.96  CALCIUM 9.4   < > 10.0  PROT 7.5  --   --   BILITOT 0.8  --   --   ALKPHOS 64  --   --   ALT 46*  --   --   AST 28  --   --   GLUCOSE 109*   < > 101*   < > = values in this interval not displayed.    Discharge Medications:  Allergies as of 03/09/2021       Reactions   Chlorthalidone Other (See Comments)   hypokalemia        Medication List     STOP taking these medications    amLODipine 5 MG tablet Commonly known as: NORVASC   losartan-hydrochlorothiazide 100-12.5 MG tablet Commonly known as: HYZAAR   simvastatin 40 MG tablet Commonly known as: ZOCOR       TAKE these medications    Aspirin Low Dose 81 MG EC tablet Generic drug: aspirin Take 1 tablet (81 mg total) by mouth daily. Swallow whole. Start  taking on: March 10, 2021   atorvastatin 20 MG tablet Commonly known as: LIPITOR Take 1 tablet (20 mg total) by mouth daily. Start taking on: March 10, 2021   losartan 100 MG tablet Commonly known as: COZAAR Take 1 tablet (100 mg total) by mouth daily. Start taking on: March 10, 2021   metoprolol succinate 50 MG 24 hr tablet Commonly known as: TOPROL-XL Take 1 tablet (50 mg total) by mouth daily. Take with or immediately following a meal.   pantoprazole 40 MG tablet Commonly known as: PROTONIX Take 1 tablet (40 mg total) by mouth at bedtime.   spironolactone 50 MG tablet  Commonly known as: ALDACTONE Take 1 tablet (50 mg total) by mouth daily. Start taking on: March 10, 2021        Disposition:     Follow-up Information     [place] Follow up.   Why: on 4/19 at 2:40 pm for post ICD check Contact information: [place] [id number] [phone]        Loel Dubonnet, NP Follow up on 03/18/2021.   Specialty: Cardiology Why: Please arrive 15 minutes early for your 8am post-hospital cardiology appointment.  Contact information: [place] [id number] [phone]                 Duration of Discharge Encounter: Greater than 30 minutes including physician time.  Signed, Abigail Butts, PA-C  03/09/2021 5:24 PM   The patient was seen and examined and agree with Roby Lofts PA-C. Please see progress note dated today.

## 2021-03-09 NOTE — Progress Notes (Addendum)
Electrophysiology Rounding Note  Patient Name: Julie James Date of Encounter: 03/09/2021  Primary Cardiologist: Julie Rogue, MD Electrophysiologist: Julie Axe, MD   Subjective   The patient is doing well today.  At this time, the patient denies chest pain, shortness of breath, or any new concerns.  Inpatient Medications    Scheduled Meds: . aspirin EC  81 mg Oral Daily  . atorvastatin  20 mg Oral Daily  . chlorhexidine  60 mL Topical Once  . chlorhexidine  60 mL Topical Once  . chlorhexidine  15 mL Mouth/Throat BID  . gentamicin irrigation  80 mg Irrigation To Cath  . losartan  100 mg Oral Daily  . metoprolol succinate  50 mg Oral Daily  . pantoprazole  40 mg Oral QHS  . spironolactone  50 mg Oral Daily   Continuous Infusions: . sodium chloride 50 mL/hr at 03/09/21 0503  . sodium chloride 50 mL/hr at 03/09/21 0505  .  ceFAZolin (ANCEF) IV     PRN Meds: acetaminophen, clonazePAM, docusate sodium, nitroGLYCERIN, ondansetron (ZOFRAN) IV, traZODone   Vital Signs    Vitals:   03/08/21 1200 03/08/21 1600 03/08/21 2135 03/09/21 0454  BP: 129/71 132/64 (!) 116/48 132/66  Pulse: 77 65  67  Resp: 16 15 16 16   Temp: 97.8 F (36.6 C) (!) 97.5 F (36.4 C) 97.9 F (36.6 C) 98 F (36.7 C)  TempSrc: Oral Oral Oral Oral  SpO2: 97% 96%  97%  Weight:    109 kg  Height:    5\' 6"  (1.676 m)    Intake/Output Summary (Last 24 hours) at 03/09/2021 0729 Last data filed at 03/08/2021 1400 Gross per 24 hour  Intake 240 ml  Output --  Net 240 ml   Filed Weights   03/06/21 0621 03/07/21 0554 03/09/21 0454  Weight: 108.7 kg 109.3 kg 109 kg    Physical Exam    GEN- The patient is well appearing, alert and oriented x 3 today.   Head- normocephalic, atraumatic Eyes-  Sclera clear, conjunctiva pink Ears- hearing intact Oropharynx- clear Neck- supple Lungs- Clear to ausculation bilaterally, normal work of breathing Heart- Regular rate and rhythm, no murmurs, rubs or  gallops GI- soft, NT, ND, + BS Extremities- no clubbing or cyanosis. No edema Skin- no rash or lesion Psych- euthymic mood, full affect Neuro- strength and sensation are intact  Labs    CBC Recent Labs    03/06/21 0945  WBC 6.9  HGB 12.4  HCT 39.0  MCV 88.6  PLT 564   Basic Metabolic Panel Recent Labs    03/06/21 0945 03/07/21 0944  NA 139 138  K 4.3 4.6  CL 104 102  CO2 27 25  GLUCOSE 160* 101*  BUN 14 13  CREATININE 0.94 0.96  CALCIUM 9.5 10.0   Liver Function Tests No results for input(s): AST, ALT, ALKPHOS, BILITOT, PROT, ALBUMIN in the last 72 hours. No results for input(s): LIPASE, AMYLASE in the last 72 hours. Cardiac Enzymes No results for input(s): CKTOTAL, CKMB, CKMBINDEX, TROPONINI in the last 72 hours.   Telemetry    NSR 60s, rare PVC (personally reviewed)  Radiology    MR CARDIAC MORPHOLOGY W WO CONTRAST  Result Date: 03/07/2021 CLINICAL DATA:  Cardiac arrest EXAM: CARDIAC MRI TECHNIQUE: The patient was scanned on a 1.5 Tesla GE magnet. A dedicated cardiac coil was used. Functional imaging was done using Fiesta sequences. 2,3, and 4 chamber views were done to assess for RWMA's. Modified Simpson's rule using  a short axis stack was used to calculate an ejection fraction on a dedicated work Conservation officer, nature. The patient received 10 cc of Gadavist. After 10 minutes inversion recovery sequences were used to assess for infiltration and scar tissue. CONTRAST:  Gadavist 10 cc FINDINGS: Limited images of the lung fields showed no gross abnormalities. Normal left ventricular size with mild LV hypertrophy. Septal lateral dyssynchrony due to LBBB, also there appeared to be mild inferior and inferolateral hypokinesis. Overall LV EF 46%, mildly decreased. Normal left and right atrial sizes. There was an atrial septal aneurysm noted. Normal right ventricular size and systolic function, EF 62%. No significant mitral regurgitation noted. Trileaflet aortic valve  with no significant stenosis or regurgitation. On delayed enhancement imaging, there was no myocardial late gadolinium enhancement (LGE). Measurements: LVEDV 209 mL LVSV 95 mL LVEF 46% RVEDV 140 mL RVSV 69 mL RVEF 49% ECV 27% IMPRESSION: 1. Normal LV size with mild concentric LV hypertrophy. Septal-lateral dyssynchrony consistent with LBBB, also mild inferior and inferolateral hypokinesis. EF 46%. 2.  Normal RV size and systolic function, EF 26%. 3. No myocardial LGE, so no definitive evidence for prior MI, infiltrative disease, or myocarditis. Normal extracellular fluid volume percentage. Julie James Electronically Signed   By: Julie James M.D.   On: 03/07/2021 21:47    Patient Profile     Julie James a 70 y.o.femalewith hx of essential hypertension who unfortunately suffered a witnessed, out of hospital cardiac arrest on 3/27. She was successfully resuscitated, and is now transferred for cMRI/secondary prevention ICD.  Echo 03/02/21 LVEF 55-60%, Normal RV  LHC 03/04/21  Normal coronaries, normal LV systolic function and left ventricular end-diastolic pressure. Wall motion could not be full evaluated  cMRI 03/07/21 1. Normal LV size with mild concentric LV hypertrophy. Septal-lateral dyssynchrony consistent with LBBB, also mild inferior and inferolateral hypokinesis. EF 46%.  2. Normal RV size and systolic function, EF 33%.  3. No myocardial LGE, so no definitive evidence for prior MI, infiltrative disease, or myocarditis. Normal extracellular fluid volume percentage.  Assessment & Plan    1. S/p Cardiac arrest Work up as above unrevealing for specific cause.  She did have NSVT on tele post arrest, as above.  AED shock x 2. K 2.7 post arrest, repleted and had further NSVT with near normal K values. Explained risks, benefits, and alternatives to ICD implantation, including but not limited to bleeding, infection, pneumothorax, pericardial effusion, lead dislodgement,  heart attack, stroke, or death.  Pt verbalized understanding and agrees to proceed.  Plan for today with Dr. Caryl James. Bumped yesterday due to case.   2. NICM EF normal by Echo, 46% by cMRI.  Will need general cardiology follow up as well.  Continue toprol. She states she was having "slow HRs" by pulse ox at a PCP visit several days prior to arrest. These rates are not available. She has tolerated BB without bradycardia here. ? Bradysphygmia in the setting of frequent ectopy leading up to her event. PVCs noted here.  Continue losartan  For questions or updates, please contact [place] Please consult [email or web address] for contact info under Cardiology/STEMI.  Signed, Shirley Friar, PA-C  03/09/2021, 7:29 AM   Discussed risks and benefits Will proceed w ICD implant today  Single chamber   Other eval of causes of SCD ie brudaga and LQTs are made more difficult by her underlying LBBB

## 2021-03-09 NOTE — Progress Notes (Signed)
Progress Note  Patient Name: Julie James Date of Encounter: 03/09/2021  [facility] Cardiologist: Ida Rogue, MD   Subjective   Doing well. Awaiting ICD implantation today.  Inpatient Medications    Scheduled Meds: . aspirin EC  81 mg Oral Daily  . atorvastatin  20 mg Oral Daily  . chlorhexidine  60 mL Topical Once  . chlorhexidine  15 mL Mouth/Throat BID  . gentamicin irrigation  80 mg Irrigation To Cath  . losartan  100 mg Oral Daily  . metoprolol succinate  50 mg Oral Daily  . pantoprazole  40 mg Oral QHS  . spironolactone  50 mg Oral Daily   Continuous Infusions: . sodium chloride 50 mL/hr at 03/09/21 0503  . sodium chloride 50 mL/hr at 03/09/21 0505  .  ceFAZolin (ANCEF) IV     PRN Meds: acetaminophen, clonazePAM, docusate sodium, nitroGLYCERIN, ondansetron (ZOFRAN) IV, traZODone   Vital Signs    Vitals:   03/08/21 1600 03/08/21 2135 03/09/21 0454 03/09/21 0819  BP: 132/64 (!) 116/48 132/66 113/67  Pulse: 65  67 71  Resp: 15 16 16 16   Temp: (!) 97.5 F (36.4 C) 97.9 F (36.6 C) 98 F (36.7 C) 98.1 F (36.7 C)  TempSrc: Oral Oral Oral Oral  SpO2: 96%  97% 98%  Weight:   109 kg   Height:   5\' 6"  (1.676 m)     Intake/Output Summary (Last 24 hours) at 03/09/2021 1116 Last data filed at 03/08/2021 1400 Gross per 24 hour  Intake 240 ml  Output --  Net 240 ml   Last 3 Weights 03/09/2021 03/07/2021 03/06/2021  Weight (lbs) 240 lb 3.2 oz 240 lb 14.4 oz 239 lb 11.2 oz  Weight (kg) 108.954 kg 109.272 kg 108.727 kg      Telemetry    NSR/sinus brady, rare PVC - Personally Reviewed  ECG    Sinus bradycardia with LBBB- Personally Reviewed  Physical Exam   GEN: No acute distress. Sitting up in a chair Neck: No JVD Cardiac: RRR, no murmurs, rubs, or gallops.  Respiratory: Clear to auscultation bilaterally. GI: Soft, nontender, non-distended  MS: No edema; No deformity. Neuro:  Nonfocal  Psych: Normal affect   Labs    High Sensitivity  Troponin:   Recent Labs  Lab 02/27/21 1539 02/27/21 1758 02/28/21 0454 02/28/21 0720  TROPONINIHS 13 105* 502* 408*      Chemistry Recent Labs  Lab 03/03/21 0531 03/04/21 0504 03/04/21 2155 03/05/21 0248 03/06/21 0945 03/07/21 0944  NA 140 139  --  137 139 138  K 4.1 3.8  --  3.3* 4.3 4.6  CL 104 100  --  100 104 102  CO2 28 29  --  28 27 25   GLUCOSE 105* 109*  --  102* 160* 101*  BUN 13 14  --  15 14 13   CREATININE 0.84 0.94   < > 0.90 0.94 0.96  CALCIUM 9.5 9.4  --  9.3 9.5 10.0  PROT 7.1 7.5  --   --   --   --   ALBUMIN 3.6 3.8  --   --   --   --   AST 35 28  --   --   --   --   ALT 59* 46*  --   --   --   --   ALKPHOS 66 64  --   --   --   --   BILITOT 0.8 0.8  --   --   --   --  GFRNONAA >60 >60   < > >60 >60 >60  ANIONGAP 8 10  --  9 8 11    < > = values in this interval not displayed.     Hematology Recent Labs  Lab 03/04/21 2155 03/05/21 0248 03/06/21 0945  WBC 7.3 6.9 6.9  RBC 4.01 4.09 4.40  HGB 11.6* 11.8* 12.4  HCT 36.0 36.3 39.0  MCV 89.8 88.8 88.6  MCH 28.9 28.9 28.2  MCHC 32.2 32.5 31.8  RDW 14.0 14.1 14.1  PLT 335 326 357    BNPNo results for input(s): BNP, PROBNP in the last 168 hours.   DDimer No results for input(s): DDIMER in the last 168 hours.   Radiology    MR CARDIAC MORPHOLOGY W WO CONTRAST  Result Date: 03/07/2021 CLINICAL DATA:  Cardiac arrest EXAM: CARDIAC MRI TECHNIQUE: The patient was scanned on a 1.5 Tesla GE magnet. A dedicated cardiac coil was used. Functional imaging was done using Fiesta sequences. 2,3, and 4 chamber views were done to assess for RWMA's. Modified Simpson's rule using a short axis stack was used to calculate an ejection fraction on a dedicated work Conservation officer, nature. The patient received 10 cc of Gadavist. After 10 minutes inversion recovery sequences were used to assess for infiltration and scar tissue. CONTRAST:  Gadavist 10 cc FINDINGS: Limited images of the lung fields showed no gross  abnormalities. Normal left ventricular size with mild LV hypertrophy. Septal lateral dyssynchrony due to LBBB, also there appeared to be mild inferior and inferolateral hypokinesis. Overall LV EF 46%, mildly decreased. Normal left and right atrial sizes. There was an atrial septal aneurysm noted. Normal right ventricular size and systolic function, EF 31%. No significant mitral regurgitation noted. Trileaflet aortic valve with no significant stenosis or regurgitation. On delayed enhancement imaging, there was no myocardial late gadolinium enhancement (LGE). Measurements: LVEDV 209 mL LVSV 95 mL LVEF 46% RVEDV 140 mL RVSV 69 mL RVEF 49% ECV 27% IMPRESSION: 1. Normal LV size with mild concentric LV hypertrophy. Septal-lateral dyssynchrony consistent with LBBB, also mild inferior and inferolateral hypokinesis. EF 46%. 2.  Normal RV size and systolic function, EF 51%. 3. No myocardial LGE, so no definitive evidence for prior MI, infiltrative disease, or myocarditis. Normal extracellular fluid volume percentage. Dalton Mclean Electronically Signed   By: Loralie Champagne M.D.   On: 03/07/2021 21:47    Cardiac Studies   Cardiac MRI 03/07/21: IMPRESSION: 1. Normal LV size with mild concentric LV hypertrophy. Septal-lateral dyssynchrony consistent with LBBB, also mild inferior and inferolateral hypokinesis. EF 46%.  2. Normal RV size and systolic function, EF 76%.  3. No myocardial LGE, so no definitive evidence for prior MI, infiltrative disease, or myocarditis. Normal extracellular fluid volume percentage.  Left heart catheterization 03/04/21:   The left ventricular systolic function is normal.  LV end diastolic pressure is normal.  The left ventricular ejection fraction is 55-65% by visual estimate.  1. Normal coronary arteries. 2. Normal LV systolic function and left ventricular end-diastolic pressure. Wall motion could not be fully evaluated.  Recommendations: No ischemic etiology for  cardiac arrest. Continue evaluation by electrophysiology.  Echo 03/02/21: 1. Left ventricular ejection fraction, by estimation, is 55 to 60%. The  left ventricle has normal function. The left ventricle has no regional  wall motion abnormalities. There is mild left ventricular hypertrophy.  Left ventricular diastolic parameters  are consistent with Grade I diastolic dysfunction (impaired relaxation).  2. Right ventricular systolic function is normal. The right ventricular  size is normal. Tricuspid regurgitation signal is inadequate for assessing  PA pressure.  3. The mitral valve is normal in structure. No evidence of mitral valve  regurgitation. No evidence of mitral stenosis.  4. The aortic valve is normal in structure. Aortic valve regurgitation is  not visualized. Mild aortic valve sclerosis is present, with no evidence  of aortic valve stenosis.  5. The inferior vena cava is normal in size with greater than 50%  respiratory variability, suggesting right atrial pressure of 3 mmHg.  6. Challenging image quality.   Patient Profile     70 y.o. female with a PMH of HTN, who presented with witnessed out of hospital cardiac arrest 02/27/21 with successful resuscitation, transferred to [facility] for further management. Cath with no obstructive disease. cMRI unremarkable with no LGE or evidence of scarring, infiltrative process or hypertrophic CM.  Assessment & Plan    #Post-cardiac arrest: patient presented with out of hospital witnessed arrest 02/27/21, s/p CPR and AED shock x2 with successful resuscitation. K down to 2.7 post-arrest. She was intubated and underwent cooling protocol. Echo 03/02/21 showed EF 55-60%, G1DD, no RWMA, and no significant valvular abnormalities. She underwent a LHC 03/04/21 which showed normal coronary arteries. EP evaluated patient recommended cardiac MR which was without evidence of scarring. Plan for ICD implantation today. - Plan for ICD implantation today -  Continue metoprolol - Continue to monitor electrolytes and replete as needed to maintain K >4, Mg >2  #HTN: Controlled - Continue metoprolol, losartan, and spironolactone  #HLD: LDL 106 this admission. She was started on atorvastatin in the setting of post-arrest. LHC showed normal coronary arteries.  - Continue atorvastatin 20mg  daily - Repeat FLP/LFTs in 6-8 weeks.   #Pre-DM type 2: A1C 5.9 this admission - Continue dietary/lifestyle modifications to prevent progression   For questions or updates, please contact [place] Please consult [email or web address] for contact info under        Signed, Freada Bergeron, MD  03/09/2021, 11:16 AM

## 2021-03-10 ENCOUNTER — Encounter (HOSPITAL_COMMUNITY): Payer: Self-pay | Admitting: Internal Medicine

## 2021-03-10 ENCOUNTER — Telehealth: Payer: Self-pay

## 2021-03-10 ENCOUNTER — Other Ambulatory Visit: Payer: Self-pay

## 2021-03-10 DIAGNOSIS — I469 Cardiac arrest, cause unspecified: Secondary | ICD-10-CM

## 2021-03-10 DIAGNOSIS — I10 Essential (primary) hypertension: Secondary | ICD-10-CM

## 2021-03-10 LAB — BLOOD GAS, ARTERIAL
Acid-base deficit: 2.5 mmol/L — ABNORMAL HIGH (ref 0.0–2.0)
Bicarbonate: 22.5 mmol/L (ref 20.0–28.0)
FIO2: 0.5
O2 Saturation: 99.7 %
PEEP: 5 cmH2O
Patient temperature: 37
RATE: 16 resp/min
pCO2 arterial: 39 mmHg (ref 32.0–48.0)
pH, Arterial: 7.37 (ref 7.350–7.450)
pO2, Arterial: 212 mmHg — ABNORMAL HIGH (ref 83.0–108.0)

## 2021-03-10 MED FILL — Lidocaine HCl Local Inj 1%: INTRAMUSCULAR | Qty: 20 | Status: AC

## 2021-03-10 MED FILL — Lidocaine HCl Local Inj 1%: INTRAMUSCULAR | Qty: 30 | Status: AC

## 2021-03-10 NOTE — Telephone Encounter (Signed)
Follow-up after same day discharge: Implant date: 03/09/21 MD: Virl Axe, MD Device: [place] Location: Left chest   Wound check visit: 03/22/21 @ 2:40 90 day MD follow-up: 06/21/21 @ 10:00  Remote Transmission received: Yes. Normal measurements that are provided.   Dressing removed: Not yet, will remove this evening.  Sling Removed: Not yet, will remove this evening

## 2021-03-10 NOTE — Consult Note (Signed)
   [facility] Inpatient Consult   03/10/2021  Julie James 02/16/1951 [id number]   [place]  Accountable Care Organization [ACO] Patient:  Medicare DCE  Patient was screened for [place] [facility] services. Patient will have the transition of care call conducted by the primary care provider - [place], FNP listed as Primary Care Provider. This patient is also in an Embedded practice which has a chronic disease management Embedded Care Management team.  Plan: Referral request sent to the [place] Management for Chronic Care Management program.  Please contact for further questions,  Natividad Brood, RN BSN [facility] Liaison  [phone] business mobile phone Toll free office [phone]  Fax number: [phone] [name]

## 2021-03-10 NOTE — Telephone Encounter (Signed)
-----   Message from Shirley Friar, PA-C sent at 03/09/2021  8:41 PM EDT ----- Same day dc ICD SK 4/6

## 2021-03-11 ENCOUNTER — Telehealth: Payer: Self-pay | Admitting: Emergency Medicine

## 2021-03-11 NOTE — Telephone Encounter (Signed)
Spoke with patient. Occlusive dressing was removed at hospital. Patient has derma-bond in place. Patient was advised to leave derma-bond in place until her 1st wound check. Patient verbalized understanding.

## 2021-03-11 NOTE — Telephone Encounter (Signed)
   Pt and her daughter calling, they said they were instructed to remove the tape on her chest but they didn't see any tape, they wanted to know what to do next

## 2021-03-17 NOTE — Progress Notes (Signed)
Office Visit    Patient Name: Julie James Date of Encounter: 03/18/2021  PCP:  Verl Bangs, [place]  Cardiologist:  Ida Rogue, MD  Advanced Practice Provider:  No care team member to display Electrophysiologist:  Virl Axe, MD   Chief Complaint    Julie James is a 70 y.o. female with a hx of hypertension, HLD, prediabetes, obesity, cardiac arrest 02/27/21 with cardiac catheterization showing clean coronary arteries s/p ICD presents today for hospital folloup   Past Medical History    Past Medical History:  Diagnosis Date  . Cardiac arrest ([place]) 02/27/2021   a.  Witnessed out of hospital cardiac arrest s/p shock x2; b.  LHC 03/04/2021 w/ normal coronary arteries; c. TTE 03/02/21 - EF 55-60%, no RWMA, mild LVH, Gr1DD, nl RVSF and ventricular cavity size, and mild aortic valve sclerosis w/o evidence of stenosis  . Hypercholesteremia   . Hypertension    Past Surgical History:  Procedure Laterality Date  . ICD IMPLANT N/A 03/09/2021   Procedure: ICD IMPLANT;  Surgeon: Deboraha Sprang, MD;  Location: [facility];  Service: Cardiovascular;  Laterality: N/A;  . LEFT HEART CATH AND CORONARY ANGIOGRAPHY N/A 03/04/2021   Procedure: LEFT HEART CATH AND CORONARY ANGIOGRAPHY;  Surgeon: Wellington Hampshire, MD;  Location: [facility];  Service: Cardiovascular;  Laterality: N/A;    Allergies  Allergies  Allergen Reactions  . Chlorthalidone Other (See Comments)    hypokalemia    History of Present Illness    Julie James is a 70 y.o. female with a hx of  hypertension, HLD, prediabetes, obesity, cardiac arrest 02/27/21 with cardiac catheterization showing clean coronary arteries s/p ICD last seen while hospitalized.  Hospitalized with cardiac arrest. Echo 03/02/21 with LVEF 55-60% and normal RV. R/LHC 03/04/21 with normal coronary arteries, normal LVEF, normal LVEDP. She was transferred to [facility] for ICD. SHe had  cardiac MRI 03/07/21 with normal LV size, mild LVH, septal-lateral dyssynchrony consistent with LBBB, EF 46%, no myocardial LGE. She had St. Jude single chamber ICD implanted 03/09/21 by Dr. Caryl Comes. CXR 03/09/21 post procedure with no pneumothorax. Her Amlodipine, Losartan-HCTZ, Simvastatin were discontinued and she was started on Toprol and Atorvastatin.   She presents today for hospital follow up.  Tells me her family including her husband, daughter, and 47 year old granddaughter have been keeping a very careful eye on her at home.  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  We reviewed her hospitalization and cardiac testing in detail.  Reports no shortness of breath nor dyspnea on exertion. Reports no chest pain, pressure, or tightness. No edema, orthopnea, PND. Reports no palpitations.  She does endorse that she has been resting and taking it easy.  She does have some confusion regarding her losartan and previous losartan-HCTZ and has not been taking Toprol since hospital discharge as it was not provided with her discharge medications.  EKGs/Labs/Other Studies Reviewed:   The following studies were reviewed today:  Cardiac MRI 03/07/21: IMPRESSION: 1. Normal LV size with mild concentric LV hypertrophy. Septal-lateral dyssynchrony consistent with LBBB, also mild inferior and inferolateral hypokinesis. EF 46%.   2.  Normal RV size and systolic function, EF 28%.   3. No myocardial LGE, so no definitive evidence for prior MI, infiltrative disease, or myocarditis. Normal extracellular fluid  volume percentage.   Left heart catheterization 03/04/21:    The left ventricular systolic function is normal.  LV end diastolic pressure is normal.  The left ventricular ejection fraction is 55-65% by visual estimate.   1.  Normal coronary arteries. 2.   Normal LV systolic function and left ventricular end-diastolic pressure.  Wall motion could not be fully evaluated.   Recommendations: No ischemic etiology for cardiac arrest. Continue evaluation by electrophysiology.   Echo 03/02/21:  1. Left ventricular ejection fraction, by estimation, is 55 to 60%. The  left ventricle has normal function. The left ventricle has no regional  wall motion abnormalities. There is mild left ventricular hypertrophy.  Left ventricular diastolic parameters  are consistent with Grade I diastolic dysfunction (impaired relaxation).   2. Right ventricular systolic function is normal. The right ventricular  size is normal. Tricuspid regurgitation signal is inadequate for assessing  PA pressure.   3. The mitral valve is normal in structure. No evidence of mitral valve  regurgitation. No evidence of mitral stenosis.   4. The aortic valve is normal in structure. Aortic valve regurgitation is  not visualized. Mild aortic valve sclerosis is present, with no evidence  of aortic valve stenosis.   5. The inferior vena cava is normal in size with greater than 50%  respiratory variability, suggesting right atrial pressure of 3 mmHg.   6. Challenging image quality.    EKG:  EKG is  ordered today.  The ekg ordered today demonstrates SR 78 bpm with LBBB  Recent Labs: 02/27/2021: B Natriuretic Peptide 122.2; TSH 3.918 03/01/2021: Magnesium 1.9 03/04/2021: ALT 46 03/06/2021: Hemoglobin 12.4; Platelets 357 03/07/2021: BUN 13; Creatinine, Ser 0.96; Potassium 4.6; Sodium 138  Recent Lipid Panel    Component Value Date/Time   CHOL 179 02/22/2021 1005   TRIG 88 02/28/2021 0454   HDL 51 02/22/2021 1005   CHOLHDL 3.5 02/22/2021 1005   VLDL 23 07/30/2017 0902   LDLCALC 106 (H) 02/22/2021 1005   Home Medications   Current Meds  Medication Sig  . aspirin 81 MG EC tablet Take 1 tablet (81 mg total) by mouth daily. Swallow whole.  . metoprolol succinate (TOPROL-XL) 50 MG 24 hr  tablet Take 1 tablet (50 mg total) by mouth daily. Take with or immediately following a meal.  . [DISCONTINUED] atorvastatin (LIPITOR) 20 MG tablet Take 1 tablet (20 mg total) by mouth daily.  . [DISCONTINUED] losartan (COZAAR) 100 MG tablet Take 1 tablet (100 mg total) by mouth daily.  . [DISCONTINUED] pantoprazole (PROTONIX) 40 MG tablet Take 1 tablet (40 mg total) by mouth at bedtime.  . [DISCONTINUED] spironolactone (ALDACTONE) 50 MG tablet Take 1 tablet (50 mg total) by mouth daily.     Review of Systems   All other systems reviewed and are otherwise negative except as noted above.  Physical Exam    VS:  BP (!) 162/82 (BP Location: Left Arm, Patient Position: Sitting, Cuff Size: Normal)   Pulse 78   Ht 5' 6"  (1.676 m)   Wt 244 lb (110.7 kg)   SpO2 96%   BMI 39.38 kg/m  , BMI Body mass index is 39.38 kg/m.  Wt Readings from Last 3 Encounters:  03/18/21 244 lb (110.7 kg)  03/09/21 240 lb 3.2 oz (109 kg)  03/04/21 244 lb 1.6 oz (110.7 kg)    GEN: Well nourished, overweight, well developed, in no acute distress. HEENT: normal. Neck: Supple, no JVD, carotid bruits, or masses. Cardiac: RRR, no murmurs, rubs, or  gallops. No clubbing, cyanosis, edema.  Radials/DP/PT 2+ and equal bilaterally.  Respiratory:  Respirations regular and unlabored, clear to auscultation bilaterally. GI: Soft, nontender, nondistended. MS: No deformity or atrophy. Skin: Warm and dry, no rash.  Left chest ICD site with skin glue intact and edges well approximated.  No signs of infection. Neuro:  Strength and sensation are intact. Psych: Normal affect.  Assessment & Plan    1. Cardiac arrest s/p ICD -ICD placed during recent admission with cardiac arrest.  Site is healing appropriately.  She reports only mild soreness if she lays on her left side.  Upcoming wound check with device clinic nurses next week.  2. HTN -BP elevated though not taking Toprol as it was not provided with her discharge medications.   Resume Toprol 50 mg daily.  Continue losartan 100 mg daily, spironolactone 50 mg daily.  Refills sent to her local pharmacy.  Encouraged home monitoring of blood pressure and to report blood pressure persistently greater than 130/80.   3. HLD -02/22/21 total cholesterol 179, HDL 51, LDL 106, triglycerides 120.  Continue atorvastatin 20 mg daily.  Refill sent to her local pharmacy.  Plan for repeat lipid, c-Met at follow-up as she will have completed 6 weeks of therapy.  4. Prediabetes -lifestyle changes encouraged including regular exercise and low carbohydrate diet.  5. HFrEF -during recent hospitalization echo and cath with normal LVEF however cardiac MRI showed LVEF 46%.  GDMT includes Toprol, losartan, spironolactone.  No indication for loop diuretic at this time as she has no signs of volume overload.  Encourage low-salt diet and restriction to less than 2 L of fluid per day.  Could consider repeat echocardiogram in 3 to 6 months for reassessment of LVEF as clinically indicated  6. GERD -continue Protonix.  Disposition: Follow up in 2 month(s) with Dr. Rockey Situ and in July with Dr. Caryl Comes as scheduled for MD device check.  Signed, Loel Dubonnet, NP 03/18/2021, 4:15 PM [place]

## 2021-03-18 ENCOUNTER — Ambulatory Visit (INDEPENDENT_AMBULATORY_CARE_PROVIDER_SITE_OTHER): Payer: Medicare Other | Admitting: Family

## 2021-03-18 ENCOUNTER — Encounter: Payer: Self-pay | Admitting: Family

## 2021-03-18 ENCOUNTER — Other Ambulatory Visit: Payer: Self-pay

## 2021-03-18 VITALS — BP 162/82 | HR 78 | Ht 66.0 in | Wt 244.0 lb

## 2021-03-18 DIAGNOSIS — K219 Gastro-esophageal reflux disease without esophagitis: Secondary | ICD-10-CM

## 2021-03-18 DIAGNOSIS — Z95810 Presence of automatic (implantable) cardiac defibrillator: Secondary | ICD-10-CM | POA: Diagnosis not present

## 2021-03-18 DIAGNOSIS — E782 Mixed hyperlipidemia: Secondary | ICD-10-CM

## 2021-03-18 DIAGNOSIS — R7303 Prediabetes: Secondary | ICD-10-CM | POA: Diagnosis not present

## 2021-03-18 DIAGNOSIS — I5020 Unspecified systolic (congestive) heart failure: Secondary | ICD-10-CM

## 2021-03-18 DIAGNOSIS — I10 Essential (primary) hypertension: Secondary | ICD-10-CM | POA: Diagnosis not present

## 2021-03-18 MED ORDER — ATORVASTATIN CALCIUM 20 MG PO TABS: 20.0000 mg | ORAL_TABLET | Freq: Every day | ORAL | 5 refills | Status: DC

## 2021-03-18 MED ORDER — METOPROLOL SUCCINATE ER 50 MG PO TB24: 50.0000 mg | ORAL_TABLET | Freq: Every day | ORAL | 5 refills | Status: DC

## 2021-03-18 MED ORDER — LOSARTAN POTASSIUM 100 MG PO TABS: 100.0000 mg | ORAL_TABLET | Freq: Every day | ORAL | 5 refills | Status: DC

## 2021-03-18 MED ORDER — PANTOPRAZOLE SODIUM 40 MG PO TBEC: 40.0000 mg | DELAYED_RELEASE_TABLET | Freq: Every day | ORAL | 5 refills | Status: DC

## 2021-03-18 MED ORDER — SPIRONOLACTONE 50 MG PO TABS: 50.0000 mg | ORAL_TABLET | Freq: Every day | ORAL | 5 refills | Status: DC

## 2021-03-18 NOTE — Patient Instructions (Signed)
Medication Instructions:  Your physician has recommended you make the following change in your medication:   CONTINUE Losartan 100mg  daily  RESUME Metoprolol Succinate 50mg  once daily  *If you need a refill on your cardiac medications before your next appointment, please call your pharmacy*   Lab Work: Your physician recommends lab work today: BMP  If you have labs (blood work) drawn today and your tests are completely normal, you will receive your results only by: Marland Kitchen MyChart Message (if you have MyChart) OR . A paper copy in the mail If you have any lab test that is abnormal or we need to change your treatment, we will call you to review the results.   Testing/Procedures: Your EKG today showed normal sinus rhythm and was stable compared to previous.    Follow-Up: At [facility], you and your health needs are our priority.  As part of our continuing mission to provide you with exceptional heart care, we have created designated Provider Care Teams.  These Care Teams include your primary Cardiologist (physician) and Advanced Practice Providers (APPs -  Physician Assistants and Nurse Practitioners) who all work together to provide you with the care you need, when you need it.  We recommend signing up for the patient portal called "MyChart".  Sign up information is provided on this After Visit Summary.  MyChart is used to connect with patients for Virtual Visits (Telemedicine).  Patients are able to view lab/test results, encounter notes, upcoming appointments, etc.  Non-urgent messages can be sent to your provider as well.   To learn more about what you can do with MyChart, go to [email or web address].    Your next appointment:    In 2 months with Dr. Rockey Situ or APP for blood pressure check-in  In July with Dr. Caryl Comes as scheduled  Other Instructions  Heart Healthy Diet Recommendations: A low-salt diet is recommended. Meats should be grilled, baked, or boiled. Avoid fried foods.  Focus on lean protein sources like fish or chicken with vegetables and fruits. The American Heart Association is a Microbiologist!  American Heart Association Diet and Lifeystyle Recommendations   Exercise recommendations: The American Heart Association recommends 150 minutes of moderate intensity exercise weekly. Try 30 minutes of moderate intensity exercise 4-5 times per week. This could include walking, jogging, or swimming.

## 2021-03-19 LAB — BASIC METABOLIC PANEL
BUN/Creatinine Ratio: 8 — ABNORMAL LOW (ref 12–28)
BUN: 7 mg/dL — ABNORMAL LOW (ref 8–27)
CO2: 22 mmol/L (ref 20–29)
Calcium: 9.6 mg/dL (ref 8.7–10.3)
Chloride: 103 mmol/L (ref 96–106)
Creatinine, Ser: 0.9 mg/dL (ref 0.57–1.00)
Glucose: 106 mg/dL — ABNORMAL HIGH (ref 65–99)
Potassium: 4.5 mmol/L (ref 3.5–5.2)
Sodium: 140 mmol/L (ref 134–144)
eGFR: 70 mL/min/{1.73_m2} (ref >59)

## 2021-03-22 ENCOUNTER — Other Ambulatory Visit: Payer: Self-pay | Admitting: Unknown Physician Specialty

## 2021-03-22 ENCOUNTER — Ambulatory Visit (INDEPENDENT_AMBULATORY_CARE_PROVIDER_SITE_OTHER): Payer: Medicare Other | Admitting: Emergency Medicine

## 2021-03-22 ENCOUNTER — Telehealth: Payer: Self-pay | Admitting: *Deleted

## 2021-03-22 ENCOUNTER — Other Ambulatory Visit: Payer: Self-pay

## 2021-03-22 DIAGNOSIS — I469 Cardiac arrest, cause unspecified: Secondary | ICD-10-CM | POA: Diagnosis not present

## 2021-03-22 LAB — CUP PACEART INCLINIC DEVICE CHECK
Battery Remaining Longevity: 92 mo
Brady Statistic RV Percent Paced: 0 %
Date Time Interrogation Session: 20220419151535
HighPow Impedance: 55.125
Implantable Lead Implant Date: 20220406
Implantable Lead Location: 753860
Implantable Lead Model: 7122
Implantable Pulse Generator Implant Date: 20220406
Lead Channel Impedance Value: 550 Ohm
Lead Channel Pacing Threshold Amplitude: 0.5 V
Lead Channel Pacing Threshold Pulse Width: 0.5 ms
Lead Channel Sensing Intrinsic Amplitude: 11.8 mV
Lead Channel Setting Pacing Amplitude: 3.5 V
Lead Channel Setting Pacing Pulse Width: 0.5 ms
Lead Channel Setting Sensing Sensitivity: 0.5 mV
Pulse Gen Serial Number: 9907684

## 2021-03-22 MED ORDER — FLUCONAZOLE 150 MG PO TABS: 150.0000 mg | ORAL_TABLET | Freq: Once | ORAL | 0 refills | Status: AC

## 2021-03-22 NOTE — Progress Notes (Signed)
Wound check appointment. Steri-strips removed. Wound without redness or edema. Incision edges approximated, wound well healed. Normal device function. Thresholds, sensing, and impedances consistent with implant measurements. Device programmed at 3.5V for extra safety margin until 3 month visit. Histogram distribution appropriate for patient and level of activity. No ventricular arrhythmias noted. Patient educated about wound care, arm mobility, lifting restrictions, shock plan. Reinforced [place] DMV driving restrictions. Vibratory alert demonstrated. Patient enrolled in remote monitoring with next transmission scheduled 06/08/21. 91 day follow up with Dr. Caryl Comes 06/21/21 in [place].

## 2021-03-22 NOTE — Chronic Care Management (AMB) (Signed)
  Chronic Care Management   Outreach Note  03/22/2021 Name: Julie James MRN: [id number] DOB: 1951-06-13  Julie James is a 70 y.o. year old female who is a primary care patient of Lorine Bears, Lupita Raider, FNP. I reached out to Julie James by phone today in response to a referral sent by Julie James's PCP, Malfi, Lupita Raider, FNP.      A telephone outreach was attempted today. The patient was referred to the case management team for assistance with care management and care coordination.   Follow Up Plan: The care management team will reach out to the patient again over the next 7 days.  If patient returns call to provider office, please advise to call [place] at [phone].  [facility]

## 2021-03-25 NOTE — Chronic Care Management (AMB) (Signed)
  Chronic Care Management   Note  03/25/2021 Name: Julie James MRN: [id number] DOB: September 29, 1951  Julie James is a 70 y.o. year old female who is a primary care patient of Lorine Bears, Lupita Raider, FNP. I reached out to Gabrielle Dare by phone today in response to a referral sent by [name] PCP, Malfi, Lupita Raider, FNP.     Julie James was given information about Chronic Care Management services today including:  1. CCM service includes personalized support from designated clinical staff supervised by her physician, including individualized plan of care and coordination with other care providers 2. 24/7 contact phone numbers for assistance for urgent and routine care needs. 3. Service will only be billed when office clinical staff spend 20 minutes or more in a month to coordinate care. 4. Only one practitioner may furnish and bill the service in a calendar month. 5. The patient may stop CCM services at any time (effective at the end of the month) by phone call to the office staff. 6. The patient will be responsible for cost sharing (co-pay) of up to 20% of the service fee (after annual deductible is met).  Patient did not agree to enrollment in care management services and does not wish to consider at this time.  Follow up plan: Patient declines engagement by the care management team. Appropriate care team members and provider have been notified via electronic communication. The care management team is available to follow up with the patient after provider conversation with the patient regarding recommendation for care management engagement and subsequent re-referral to the care management team.   [facility]  Direct Dial: [phone]

## 2021-05-25 ENCOUNTER — Encounter: Payer: Self-pay | Admitting: Cardiovascular Disease

## 2021-05-25 ENCOUNTER — Other Ambulatory Visit: Payer: Self-pay

## 2021-05-25 ENCOUNTER — Ambulatory Visit (INDEPENDENT_AMBULATORY_CARE_PROVIDER_SITE_OTHER): Payer: Medicare Other | Admitting: Cardiovascular Disease

## 2021-05-25 VITALS — BP 160/80 | HR 73 | Ht 66.0 in | Wt 255.5 lb

## 2021-05-25 DIAGNOSIS — E782 Mixed hyperlipidemia: Secondary | ICD-10-CM

## 2021-05-25 DIAGNOSIS — T50905A Adverse effect of unspecified drugs, medicaments and biological substances, initial encounter: Secondary | ICD-10-CM

## 2021-05-25 DIAGNOSIS — E876 Hypokalemia: Secondary | ICD-10-CM

## 2021-05-25 DIAGNOSIS — I469 Cardiac arrest, cause unspecified: Secondary | ICD-10-CM

## 2021-05-25 DIAGNOSIS — I10 Essential (primary) hypertension: Secondary | ICD-10-CM | POA: Diagnosis not present

## 2021-05-25 MED ORDER — AMLODIPINE BESYLATE 5 MG PO TABS: 5.0000 mg | ORAL_TABLET | Freq: Every day | ORAL | 3 refills | Status: DC

## 2021-05-25 NOTE — Progress Notes (Signed)
Cardiology Office Note  Date:  05/25/2021   ID:  Julie James, DOB 12-18-1950, MRN [id number]  PCP:  Verl Bangs, FNP   Chief Complaint  Patient presents with   2 month follow up    "Doing well." Medications reviewed by the patient verbally.     HPI:  Julie James is a 70 y.o. female was admitted February 27, 2021 for cardiac arrest  Had CPR, shock x2, Normal ejection fraction on echocardiogram Full recovery,  presents to establish today for follow-up after her cardiac arrest  Prior events reviewed 02/27/21, with witnessed and aborted cardiac arrest s/p immediate CPR.  911 was called and on fire/rescue arrival, patient was reportedly in a shockable rhythm.  She received 2 shocks.  On EMS arrival, she had a pulse and was in what appeared to be a normal sinus rhythm.    Code STEMI was activated due to concern for anterior precordial lead elevations.  En route, she was minimally responsive.  She arrived to the ED with pulses, unresponsive.  Code STEMI was cancelled by Dr. Lance Sell.  She was intubated in the emergency room due to being unresponsive.  In the ED, she developed WCT and was started on amiodarone which was subsequently stopped due to bradycardia.   HS-Tn initially 13 with a delta of 105, ultimately peaking at 502 and down trending.   Echo on 03/02/2021 showed an EF of 55-60%, no RWMA, mild LVH, Gr1DD, normal RVSF and ventricular cavity size, and mild aortic valve sclerosis without evidence of stenosis.   MRI brain showed no acute intracranial process. She was treated with IV heparin for 48 hours. Initial potassium was low at 2.7, which was repleted. Magnesium 2.0.  As outlined above, there were initial concerns for anoxic brain injury, though she has subsequently had return of near normal cognitive function.   Echo with normal EF. LHC with normal LVEDP and normal coronaries. cMRI with EF 46% and no LGE.    underwent implantation of a St. Jude single chamber ICD    Bp elevated at home High here  A1c 5.9  EKG personally reviewed by myself on todays visit Shows normal sinus rhythm rate 73 bpm left bundle branch block  PMH:   has a past medical history of Cardiac arrest ([place]) (02/27/2021), Hypercholesteremia, and Hypertension.  PSH:    Past Surgical History:  Procedure Laterality Date   ICD IMPLANT N/A 03/09/2021   Procedure: ICD IMPLANT;  Surgeon: Deboraha Sprang, MD;  Location: [facility];  Service: Cardiovascular;  Laterality: N/A;   LEFT HEART CATH AND CORONARY ANGIOGRAPHY N/A 03/04/2021   Procedure: LEFT HEART CATH AND CORONARY ANGIOGRAPHY;  Surgeon: Wellington Hampshire, MD;  Location: [facility];  Service: Cardiovascular;  Laterality: N/A;    Current Outpatient Medications  Medication Sig Dispense Refill   amLODipine (NORVASC) 5 MG tablet Take 1 tablet (5 mg total) by mouth daily. 180 tablet 3   aspirin 81 MG EC tablet Take 1 tablet (81 mg total) by mouth daily. Swallow whole. 30 tablet 11   atorvastatin (LIPITOR) 20 MG tablet Take 1 tablet (20 mg total) by mouth daily. 30 tablet 5   losartan (COZAAR) 100 MG tablet Take 1 tablet (100 mg total) by mouth daily. 30 tablet 5   metoprolol succinate (TOPROL-XL) 50 MG 24 hr tablet Take 1 tablet (50 mg total) by mouth daily. Take with or immediately following a meal. 30 tablet 5   pantoprazole (PROTONIX) 40 MG tablet Take  1 tablet (40 mg total) by mouth at bedtime. 30 tablet 5   spironolactone (ALDACTONE) 50 MG tablet Take 1 tablet (50 mg total) by mouth daily. 30 tablet 5   No current facility-administered medications for this visit.     Allergies:   Chlorthalidone   Social History:  The patient  reports that she has never smoked. She has never used smokeless tobacco. She reports that she does not drink alcohol and does not use drugs.   Family History:   family history includes Cancer in her sister and sister.    Review of Systems: Review of Systems  Constitutional:  Negative.   HENT: Negative.    Respiratory: Negative.    Cardiovascular: Negative.   Gastrointestinal: Negative.   Musculoskeletal: Negative.   Neurological: Negative.   Psychiatric/Behavioral: Negative.    All other systems reviewed and are negative.   PHYSICAL EXAM: VS:  BP (!) 160/80 (BP Location: Left Arm, Patient Position: Sitting, Cuff Size: Large)   Pulse 73   Ht 5\' 6"  (1.676 m)   Wt 255 lb 8 oz (115.9 kg)   SpO2 98%   BMI 41.24 kg/m  , BMI Body mass index is 41.24 kg/m. GEN: Well nourished, well developed, in no acute distress HEENT: normal Neck: no JVD, carotid bruits, or masses Cardiac: RRR; no murmurs, rubs, or gallops,no edema  Respiratory:  clear to auscultation bilaterally, normal work of breathing GI: soft, nontender, nondistended, + BS MS: no deformity or atrophy Skin: warm and dry, no rash Neuro:  Strength and sensation are intact Psych: euthymic mood, full affect   Recent Labs: 02/27/2021: B Natriuretic Peptide 122.2; TSH 3.918 03/01/2021: Magnesium 1.9 03/04/2021: ALT 46 03/06/2021: Hemoglobin 12.4; Platelets 357 03/18/2021: BUN 7; Creatinine, Ser 0.90; Potassium 4.5; Sodium 140    Lipid Panel Lab Results  Component Value Date   CHOL 179 02/22/2021   HDL 51 02/22/2021   LDLCALC 106 (H) 02/22/2021   TRIG 88 02/28/2021      Wt Readings from Last 3 Encounters:  05/25/21 255 lb 8 oz (115.9 kg)  03/18/21 244 lb (110.7 kg)  03/09/21 240 lb 3.2 oz (109 kg)      ASSESSMENT AND PLAN:  Problem List Items Addressed This Visit       Cardiology Problems   Hypertension   Relevant Medications   amLODipine (NORVASC) 5 MG tablet   Other Relevant Orders   EKG 12-Lead   Hyperlipidemia   Relevant Medications   amLODipine (NORVASC) 5 MG tablet   Cardiac arrest (HCC) - Primary   Relevant Medications   amLODipine (NORVASC) 5 MG tablet     Other   Drug-induced hypokalemia   Cardiac arrest Recent hospitalization reviewed, currently back to her  baseline Followed by Dr. Caryl Comes for ICD  Essential hypertension All measurements in the computer showing high blood pressure Recommend she start amlodipine 5 mg daily with close monitoring of blood pressure at home Avoid thiazide diuretics  Morbid obesity We have encouraged continued exercise, careful diet management in an effort to lose weight.  ICD in place Followed by Dr. Caryl Comes Denies any near-syncope, syncope, shock  Extensive records reviewed recent cardiac arrest hospitalization  Total encounter time more than 35 minutes  Greater than 50% was spent in counseling and coordination of care with the patient    Signed, Esmond Plants, M.D., Ph.D. [place], [place]

## 2021-05-25 NOTE — Patient Instructions (Addendum)
Medication Instructions:  START amlodipine 5 mg daily  If you need a refill on your cardiac medications before your next appointment, please call your pharmacy.   Lab work: No new labs needed  Testing/Procedures: No new testing needed  Follow-Up: At [facility], you and your health needs are our priority.  As part of our continuing mission to provide you with exceptional heart care, we have created designated Provider Care Teams.  These Care Teams include your primary Cardiologist (physician) and Advanced Practice Providers (APPs -  Physician Assistants and Nurse Practitioners) who all work together to provide you with the care you need, when you need it.  You will need a follow up appointment in 12 months  Providers on your designated Care Team:   Murray Hodgkins, NP Christell Faith, PA-C Marrianne Mood, PA-C [place], [place]  COVID-19 Vaccine Information can be found at: [email or web address].co.uk For questions related to vaccine distribution or appointments, please email vaccine@ [email or web address] or call [phone].

## 2021-06-08 ENCOUNTER — Ambulatory Visit (INDEPENDENT_AMBULATORY_CARE_PROVIDER_SITE_OTHER): Payer: Medicare Other

## 2021-06-08 DIAGNOSIS — I469 Cardiac arrest, cause unspecified: Secondary | ICD-10-CM

## 2021-06-09 LAB — CUP PACEART REMOTE DEVICE CHECK
Battery Remaining Longevity: 86 mo
Battery Remaining Percentage: 88 %
Battery Voltage: 3.13 V
Brady Statistic RV Percent Paced: 1 %
Date Time Interrogation Session: 20220706020016
HighPow Impedance: 69 Ohm
HighPow Impedance: 69 Ohm
Implantable Lead Implant Date: 20220406
Implantable Lead Location: 753860
Implantable Lead Model: 7122
Implantable Pulse Generator Implant Date: 20220406
Lead Channel Impedance Value: 530 Ohm
Lead Channel Pacing Threshold Amplitude: 0.5 V
Lead Channel Pacing Threshold Pulse Width: 0.5 ms
Lead Channel Sensing Intrinsic Amplitude: 11.8 mV
Lead Channel Setting Pacing Amplitude: 3.5 V
Lead Channel Setting Pacing Pulse Width: 0.5 ms
Lead Channel Setting Sensing Sensitivity: 0.5 mV
Pulse Gen Serial Number: 9907684

## 2021-06-21 ENCOUNTER — Ambulatory Visit (INDEPENDENT_AMBULATORY_CARE_PROVIDER_SITE_OTHER): Payer: Medicare Other

## 2021-06-21 ENCOUNTER — Ambulatory Visit (INDEPENDENT_AMBULATORY_CARE_PROVIDER_SITE_OTHER): Payer: Medicare Other | Admitting: Internal Medicine

## 2021-06-21 ENCOUNTER — Encounter: Payer: Self-pay | Admitting: Internal Medicine

## 2021-06-21 ENCOUNTER — Other Ambulatory Visit: Payer: Self-pay

## 2021-06-21 VITALS — BP 140/90 | HR 79 | Ht 66.0 in | Wt 254.0 lb

## 2021-06-21 DIAGNOSIS — Z79899 Other long term (current) drug therapy: Secondary | ICD-10-CM

## 2021-06-21 DIAGNOSIS — I493 Ventricular premature depolarization: Secondary | ICD-10-CM

## 2021-06-21 DIAGNOSIS — I469 Cardiac arrest, cause unspecified: Secondary | ICD-10-CM | POA: Diagnosis not present

## 2021-06-21 LAB — PACEMAKER DEVICE OBSERVATION

## 2021-06-21 NOTE — Patient Instructions (Addendum)
Medication Instructions:  Your physician recommends that you continue on your current medications as directed. Please refer to the Current Medication list given to you today.  *If you need a refill on your cardiac medications before your next appointment, please call your pharmacy*   Lab Work: BMET and Mg today  If you have labs (blood work) drawn today and your tests are completely normal, you will receive your results only by: [place] (if you have MyChart) OR A paper copy in the mail If you have any lab test that is abnormal or we need to change your treatment, we will call you to review the results.   Testing/Procedures: [place]- Long Term Monitor Instructions  Your physician has requested you wear a ZIO patch monitor for 3 days.  This is a single patch monitor. [employer] supplies one patch monitor per enrollment. Additional stickers are not available. Please do not apply patch if you will be having a Nuclear Stress Test,  Echocardiogram, Cardiac CT, MRI, or Chest Xray during the period you would be wearing the  monitor. The patch cannot be worn during these tests. You cannot remove and re-apply the  ZIO XT patch monitor.  Your ZIO patch monitor will be mailed 3 day USPS to your address on file. It may take 3-5 days  to receive your monitor after you have been enrolled.  Once you have received your monitor, please review the enclosed instructions. Your monitor  has already been registered assigning a specific monitor serial # to you.  Billing and Patient Assistance Program Information  We have supplied [employer] with any of your insurance information on file for billing purposes. [employer] offers a sliding scale Patient Assistance Program for patients that do not have  insurance, or whose insurance does not completely cover the cost of the ZIO monitor.  You must apply for the Patient Assistance Program to qualify for this discounted rate.  To apply, please call [employer] at  [phone], select option 4, select option 2, ask to apply for  Patient Assistance Program. Theodore Demark will ask your household income, and how many people  are in your household. They will quote your out-of-pocket cost based on that information.  [employer] will also be able to set up a 75-month, interest-free payment plan if needed.  Applying the monitor   Shave hair from upper left chest.  Hold abrader disc by orange tab. Rub abrader in 40 strokes over the upper left chest as  indicated in your monitor instructions.  Clean area with 4 enclosed alcohol pads. Let dry.  Apply patch as indicated in monitor instructions. Patch will be placed under collarbone on left  side of chest with arrow pointing upward.  Rub patch adhesive wings for 2 minutes. Remove white label marked "1". Remove the white  label marked "2". Rub patch adhesive wings for 2 additional minutes.  While looking in a mirror, press and release button in center of patch. A small green light will  flash 3-4 times. This will be your only indicator that the monitor has been turned on.  Do not shower for the first 24 hours. You may shower after the first 24 hours.  Press the button if you feel a symptom. You will hear a small click. Record Date, Time and  Symptom in the Patient Logbook.  When you are ready to remove the patch, follow instructions on the last 2 pages of Patient  Logbook. Stick patch monitor onto the last page of Patient Logbook.  Place Patient Logbook in the blue and white box. Use locking tab on box and tape box closed  securely. The blue and white box has prepaid postage on it. Please place it in the mailbox as  soon as possible. Your physician should have your test results approximately 7 days after the  monitor has been mailed back to [facility].  Call [place] at [phone] if you have questions regarding  your ZIO XT patch monitor. Call them immediately if you see an orange light  blinking on your  monitor.  If your monitor falls off in less than 4 days, contact our Monitor department at [phone].  If your monitor becomes loose or falls off after 4 days call [employer] at [phone] for  suggestions on securing your monitor    Follow-Up: At [facility], you and your health needs are our priority.  As part of our continuing mission to provide you with exceptional heart care, we have created designated Provider Care Teams.  These Care Teams include your primary Cardiologist (physician) and Advanced Practice Providers (APPs -  Physician Assistants and Nurse Practitioners) who all work together to provide you with the care you need, when you need it.  We recommend signing up for the patient portal called "MyChart".  Sign up information is provided on this After Visit Summary.  MyChart is used to connect with patients for Virtual Visits (Telemedicine).  Patients are able to view lab/test results, encounter notes, upcoming appointments, etc.  Non-urgent messages can be sent to your provider as well.   To learn more about what you can do with MyChart, go to [email or web address].    Your next appointment:   9 month(s)  The format for your next appointment:   In Person  Provider:   Virl Axe, MD

## 2021-06-21 NOTE — Progress Notes (Signed)
Patient ID: Julie James, female   DOB: 09/17/1951, 70 y.o.   MRN: [id number]      Patient Care Team: Verl Bangs, FNP as PCP - General (Family Medicine) Minna Merritts, MD as PCP - Cardiology (Cardiology) Deboraha Sprang, MD as PCP - Electrophysiology (Cardiology) Christene Lye, MD (General Surgery)     Julie James is a 70 y.o. female seen in follow-up for an aborted cardiac arrest for which she received a [employer] ICD 4/22    Today, the patient denies chest pain, nocturnal dyspnea, orthopnea or peripheral edema.  There have been no palpitations, lightheadedness or syncope .  Complains of Dyspnea assoc with exertion, provoked by  1 flight of stairs, <75 ft .She notes it is intermittent .  She does not snore at night   Hydration is replete and Sodium intake depleted   She know she is over weight and will began to work on losing weight.  She has notice when laying down depending on which side she feels discomfort at device sight   Daughter passed away from Lupus 5 years ago  DATE TEST EF%   3/22 Echo  55-60 %   4/22 LHC  55-65 %   4/22 MRI 46$ LGE neg    Date   Cr              K          Hgb   4/21 0.79 4.5          11.1   3/22 0.94 4.1         11.3   4/22 0.90 4.5          12.4    Records and Results Reviewed   Past Medical History:  Diagnosis Date   Cardiac arrest ([place]) 02/27/2021   a.  Witnessed out of hospital cardiac arrest s/p shock x2; b.  LHC 03/04/2021 w/ normal coronary arteries; c. TTE 03/02/21 - EF 55-60%, no RWMA, mild LVH, Gr1DD, nl RVSF and ventricular cavity size, and mild aortic valve sclerosis w/o evidence of stenosis   Hypercholesteremia    Hypertension     Past Surgical History:  Procedure Laterality Date   ICD IMPLANT N/A 03/09/2021   Procedure: ICD IMPLANT;  Surgeon: Deboraha Sprang, MD;  Location: [facility];  Service: Cardiovascular;  Laterality: N/A;   LEFT HEART CATH AND CORONARY ANGIOGRAPHY N/A 03/04/2021    Procedure: LEFT HEART CATH AND CORONARY ANGIOGRAPHY;  Surgeon: Wellington Hampshire, MD;  Location: [facility];  Service: Cardiovascular;  Laterality: N/A;    Current Meds  Medication Sig   amLODipine (NORVASC) 5 MG tablet Take 1 tablet (5 mg total) by mouth daily.   aspirin 81 MG EC tablet Take 1 tablet (81 mg total) by mouth daily. Swallow whole.   atorvastatin (LIPITOR) 20 MG tablet Take 1 tablet (20 mg total) by mouth daily.   losartan (COZAAR) 100 MG tablet Take 1 tablet (100 mg total) by mouth daily.   metoprolol succinate (TOPROL-XL) 50 MG 24 hr tablet Take 1 tablet (50 mg total) by mouth daily. Take with or immediately following a meal.   pantoprazole (PROTONIX) 40 MG tablet Take 1 tablet (40 mg total) by mouth at bedtime.   spironolactone (ALDACTONE) 50 MG tablet Take 1 tablet (50 mg total) by mouth daily.    Allergies  Allergen Reactions   Chlorthalidone Other (See Comments)    hypokalemia    Review of Systems  negative except from HPI and PMH  Physical Exam: BP 140/90 (BP Location: Left Arm, Patient Position: Sitting, Cuff Size: Normal)   Pulse 79   Ht 5\' 6"  (1.676 m)   Wt 254 lb (115.2 kg)   SpO2 92%   BMI 41.00 kg/m  Well developed and Morbidly obese  in no acute distress HENT normal Neck supple with JVP-flat Lungs Clear Device pocket well healed; without hematoma or erythema.  There is no tethering  Irregular rate and rhythm, No gallop No  murmur Abd-soft with active BS No Clubbing cyanosis No edema Skin-warm and dry A & Oriented  Grossly normal sensory and motor function  ECG: Sinus rhythm at 79 Interval 17/15/43 PVCs-frequent with a right bundle branch mostly superior axis   Assessment and  Plan:  Aborted cardiac arrest  Nonischemic cardiomyopathy-mild  PVCs right bundle-dimorphic with superior axis and inferior axis  Left bundle branch block  Hypertension  HFpEF/dyspnea on exertion  ICD-[employer]   No interval arrhythmias.  With  her modest cardiomyopathy, we will continue the L spironolactone-50, losartan-50 and metoprolol-50.  We discussed the role of Jardiance.  She at this juncture would like to hold off.  I think this is reasonable given the modest benefits and the significant costs.  Discussed cardiac rehab given her dyspnea on exertion; she is disinclined.  We discussed intentional home exercise.  PVC burden is significant.  Not seen previously.  We will quantitate.  Could have an impact on her cardiomyopathy.  Left bundle branch block persisting.  Potentially targeted therapy  Blood pressure is elevated.  But better on her complex regime as outlined above.  We will continue.  Have asked her to check her blood pressure at home.  We will check a metabolic profile and magnesium given the PVC burden and the Aldactone    Current medicines are reviewed at length with the patient today .  The patient does not  have concerns regarding medicines.   I,Stephanie Williams,acting as a Education administrator for Virl Axe, MD.,have documented all relevant documentation on the behalf of Virl Axe, MD,as directed by  Virl Axe, MD while in the presence of Virl Axe, MD.  I, Virl Axe, MD, have reviewed all documentation for this visit. The documentation on 06/21/21 for the exam, diagnosis, procedures, and orders are all accurate and complete.

## 2021-06-22 LAB — BASIC METABOLIC PANEL
BUN/Creatinine Ratio: 12 (ref 12–28)
BUN: 11 mg/dL (ref 8–27)
CO2: 21 mmol/L (ref 20–29)
Calcium: 9.6 mg/dL (ref 8.7–10.3)
Chloride: 103 mmol/L (ref 96–106)
Creatinine, Ser: 0.91 mg/dL (ref 0.57–1.00)
Glucose: 95 mg/dL (ref 65–99)
Potassium: 4.3 mmol/L (ref 3.5–5.2)
Sodium: 141 mmol/L (ref 134–144)
eGFR: 69 mL/min/{1.73_m2} (ref >59)

## 2021-06-22 LAB — MAGNESIUM: Magnesium: 2 mg/dL (ref 1.6–2.3)

## 2021-06-24 DIAGNOSIS — I493 Ventricular premature depolarization: Secondary | ICD-10-CM | POA: Diagnosis not present

## 2021-06-28 DIAGNOSIS — I493 Ventricular premature depolarization: Secondary | ICD-10-CM | POA: Diagnosis not present

## 2021-06-30 NOTE — Progress Notes (Signed)
Remote ICD transmission.   

## 2021-07-04 ENCOUNTER — Telehealth: Payer: Self-pay | Admitting: Cardiovascular Disease

## 2021-07-04 NOTE — Telephone Encounter (Signed)
Spoke with the patient. Patient called to report a low heart rate today while monitoring her BP. BP 177/53 L  184/58 R 37 bpm (using bp machine) 43 bpm manually.  She is asymptomatic. She denies pre-syncope or syncope. She has taken her medications this morning including her Metoprolol 50 mg.  Patient has an ICD. She denies any firing of her device. She has the capability to send a remote transmission. Adv her to go ahead and send a remote transmission now.  Adv the patient that I will fwd a message to our device clinic to review.  Patient adv to call 911 if cardiac symptoms/pre-syncope/syncope develop.

## 2021-07-04 NOTE — Telephone Encounter (Signed)
STAT if HR is under 50 or over 120 (normal HR is 60-100 beats per minute)  What is your heart rate? 37 per bp cuff 43 manual      Do you have a log of your heart rate readings (document readings)? No hx of brady just before recent MI   Do you have any other symptoms? Denies symptoms    Patient calling to report BR and is not sure if she should be concerned.  She states this happened right before her recent MI.    Asked daughter present with patient to obtain manual pulse.   Patient reports 177/53 L  184/58 R

## 2021-07-04 NOTE — Telephone Encounter (Signed)
Manual transmission received.  Pt device is programmed VVI 40bpm, therefore a HR of 43 is a possible occurrence.  Report reflects normal device function, no arrhythmias noted.   Routing back to triage to address elevated BP if necessary.

## 2021-07-04 NOTE — Telephone Encounter (Addendum)
Spoke with the patient and her daughter. Patient made aware of the device clinic RN interpretation of the patients device remote transmission.  BP readings provided by the patient in the previous message was taken 20 mins after the patient had taken her medications. Confirmed that the patient is taking all of her medications as prescribed. I asked the patient to recheck her BP while I held the line. Patient attempted but kept getting an error on her machine.  Adv the patient to continue to monitor her BP daily. 2-3 hours after taking her medication. Adv her to contact the office if her BP is consistently >140/90 or if cardiac symptoms develop. Patient agreeable with the plan and voiced appreciation for the call.  Will route to Dr. Rockey Situ and his nurse for further recommendation if needed.

## 2021-07-11 NOTE — Telephone Encounter (Signed)
I have spoken with the patient and advised her of her ZIO results. She has also been made aware to continue her metoprolol succinate 50 mg once daily as recommended per Dr. Caryl Comes.  The patient voices understanding and is agreeable.

## 2021-07-11 NOTE — Telephone Encounter (Signed)
AS noted on the report, she has daytime HR's that are normal, and so suspect the 40's are related to the PVCs ( about 3%) and as such I wojld continue the metoprolol at 50 mg as you ( tim) intended Thanks SK

## 2021-07-11 NOTE — Telephone Encounter (Signed)
Reading Physician Reading Date Result Priority  Deboraha Sprang, MD [phone] 07/11/2021 Routine   Narrative & Impression   Patch Wear Time:  3 days and 0 hours (2022-07-19T11:21:27-0400 to 2022-07-22T11:36:18-0400)   Patient had a min HR of 52 bpm, max HR of 135 bpm, and avg HR of 67 bpm. Predominant underlying rhythm was Sinus Rhythm. Bundle Branch Block/IVCD was present,QRS morphology changes were present. Isolated SVEs were rare (<1.0%), and no SVE Couplets or SVE  Triplets were present. Isolated VEs were occasional (3.2%, 9144), VE Couplets were rare (<1.0%, 309), and VE Triplets were rare (<1.0%, 1). Ventricular Bigeminy and Trigeminy were present.   I suspect daytime rates recorded in the 40's are related to the 3% PVCs and are not sinus bradycardia Ok to continue metoprolol at the current dose

## 2021-07-15 ENCOUNTER — Telehealth: Payer: Self-pay | Admitting: Family Medicine

## 2021-07-15 NOTE — Telephone Encounter (Signed)
Called patient to schedule AWV, call was discounted.  Unable to get pt. To answer when I called back. If pt. Calls the office back please schedule at anytime with [facility].      40 Minutes appointment   Any questions, please call me at [phone]

## 2021-07-19 ENCOUNTER — Telehealth: Payer: Self-pay | Admitting: Internal Medicine

## 2021-07-19 NOTE — Telephone Encounter (Signed)
Pt reports she is having a spasm pain on the right side of her body below the breast.  Advised that her device is not associated with this area of her body and cardiac pain does not typically refer, therefore she should consult with PCP about the pain

## 2021-07-19 NOTE — Telephone Encounter (Signed)
Patient calling to see if muscle spasm on R side chest is related to device .  Please call to discuss.

## 2021-07-23 ENCOUNTER — Other Ambulatory Visit: Payer: Self-pay

## 2021-07-23 ENCOUNTER — Emergency Department
Admission: EM | Admit: 2021-07-23 | Discharge: 2021-07-24 | Disposition: A | Discharge status: Other Acute Inpatient Hospital | Payer: Medicare Other | Attending: Emergency Medicine | Admitting: Emergency Medicine

## 2021-07-23 ENCOUNTER — Encounter: Payer: Self-pay | Admitting: *Deleted

## 2021-07-23 ENCOUNTER — Emergency Department: Payer: Medicare Other

## 2021-07-23 DIAGNOSIS — I10 Essential (primary) hypertension: Secondary | ICD-10-CM | POA: Insufficient documentation

## 2021-07-23 DIAGNOSIS — T82198A Other mechanical complication of other cardiac electronic device, initial encounter: Secondary | ICD-10-CM | POA: Diagnosis not present

## 2021-07-23 DIAGNOSIS — Z79899 Other long term (current) drug therapy: Secondary | ICD-10-CM | POA: Insufficient documentation

## 2021-07-23 DIAGNOSIS — T85591A Other mechanical complication of esophageal anti-reflux device, initial encounter: Secondary | ICD-10-CM | POA: Insufficient documentation

## 2021-07-23 DIAGNOSIS — I110 Hypertensive heart disease with heart failure: Secondary | ICD-10-CM | POA: Diagnosis not present

## 2021-07-23 DIAGNOSIS — Z7982 Long term (current) use of aspirin: Secondary | ICD-10-CM | POA: Insufficient documentation

## 2021-07-23 DIAGNOSIS — Y838 Other surgical procedures as the cause of abnormal reaction of the patient, or of later complication, without mention of misadventure at the time of the procedure: Secondary | ICD-10-CM | POA: Insufficient documentation

## 2021-07-23 DIAGNOSIS — R0789 Other chest pain: Secondary | ICD-10-CM | POA: Insufficient documentation

## 2021-07-23 DIAGNOSIS — T82118A Breakdown (mechanical) of other cardiac electronic device, initial encounter: Secondary | ICD-10-CM | POA: Diagnosis not present

## 2021-07-23 DIAGNOSIS — Z950 Presence of cardiac pacemaker: Secondary | ICD-10-CM | POA: Insufficient documentation

## 2021-07-23 DIAGNOSIS — I5021 Acute systolic (congestive) heart failure: Secondary | ICD-10-CM | POA: Diagnosis not present

## 2021-07-23 DIAGNOSIS — Y848 Other medical procedures as the cause of abnormal reaction of the patient, or of later complication, without mention of misadventure at the time of the procedure: Secondary | ICD-10-CM | POA: Insufficient documentation

## 2021-07-23 DIAGNOSIS — R079 Chest pain, unspecified: Secondary | ICD-10-CM | POA: Diagnosis not present

## 2021-07-23 LAB — COMPREHENSIVE METABOLIC PANEL
ALT: 17 U/L (ref 0–44)
AST: 23 U/L (ref 15–41)
Albumin: 4.4 g/dL (ref 3.5–5.0)
Alkaline Phosphatase: 89 U/L (ref 38–126)
Anion gap: 8 (ref 5–15)
BUN: 15 mg/dL (ref 8–23)
CO2: 26 mmol/L (ref 22–32)
Calcium: 9.4 mg/dL (ref 8.9–10.3)
Chloride: 105 mmol/L (ref 98–111)
Creatinine, Ser: 0.9 mg/dL (ref 0.44–1.00)
GFR, Estimated: >60 mL/min (ref >60)
Glucose, Bld: 106 mg/dL — ABNORMAL HIGH (ref 70–99)
Potassium: 3.7 mmol/L (ref 3.5–5.1)
Sodium: 139 mmol/L (ref 135–145)
Total Bilirubin: 0.5 mg/dL (ref 0.3–1.2)
Total Protein: 8.7 g/dL — ABNORMAL HIGH (ref 6.5–8.1)

## 2021-07-23 LAB — CBC WITH DIFFERENTIAL/PLATELET
Abs Immature Granulocytes: 0.02 10*3/uL (ref 0.00–0.07)
Basophils Absolute: 0.1 10*3/uL (ref 0.0–0.1)
Basophils Relative: 1 %
Eosinophils Absolute: 0.1 10*3/uL (ref 0.0–0.5)
Eosinophils Relative: 1 %
HCT: 37.1 % (ref 36.0–46.0)
Hemoglobin: 12 g/dL (ref 12.0–15.0)
Immature Granulocytes: 0 %
Lymphocytes Relative: 47 %
Lymphs Abs: 3.6 10*3/uL (ref 0.7–4.0)
MCH: 28.6 pg (ref 26.0–34.0)
MCHC: 32.3 g/dL (ref 30.0–36.0)
MCV: 88.5 fL (ref 80.0–100.0)
Monocytes Absolute: 0.6 10*3/uL (ref 0.1–1.0)
Monocytes Relative: 8 %
Neutro Abs: 3.4 10*3/uL (ref 1.7–7.7)
Neutrophils Relative %: 43 %
Platelets: 373 10*3/uL (ref 150–400)
RBC: 4.19 MIL/uL (ref 3.87–5.11)
RDW: 14.1 % (ref 11.5–15.5)
WBC: 7.8 10*3/uL (ref 4.0–10.5)
nRBC: 0 % (ref 0.0–0.2)

## 2021-07-23 LAB — BRAIN NATRIURETIC PEPTIDE: B Natriuretic Peptide: 228.6 pg/mL — ABNORMAL HIGH (ref 0.0–100.0)

## 2021-07-23 LAB — TROPONIN I (HIGH SENSITIVITY): Troponin I (High Sensitivity): 7 ng/L (ref <18)

## 2021-07-23 IMAGING — DX DG CHEST 1V PORT
1 series · 1 of 1 positions shown · non-contrast
Comparison: [DATE]

CLINICAL DATA: Chest pain, defibrillator firing intermittently for
1 week

EXAM:
PORTABLE CHEST 1 VIEW

[chest ap]
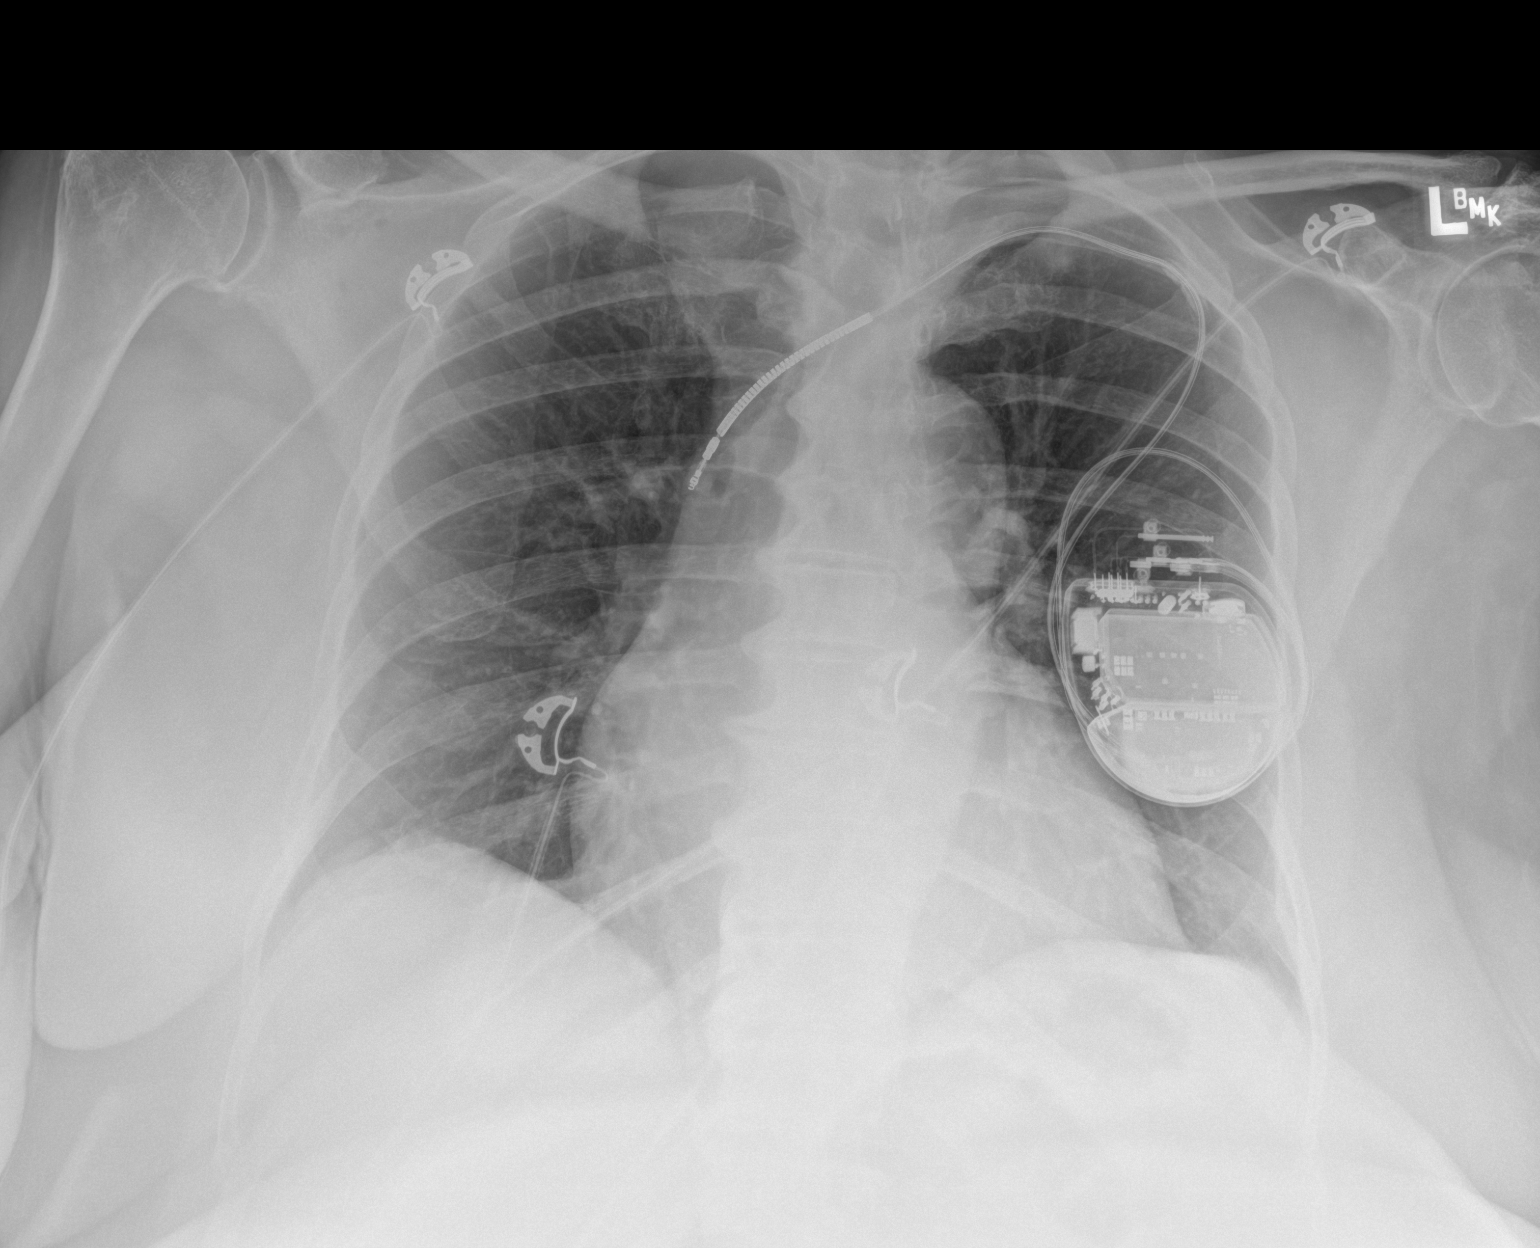

[1 of 1 positions shown; findings below may reference images not displayed]

FINDINGS: Single frontal view of the chest demonstrates interval change in
positioning of the implanted defibrillator. Since the previous exam,
the lead has retracted, now in the region of the superior vena cava,
with coiling of the lead surrounding the generator, which has
changed orientation within the left anterior chest wall. Findings
are consistent with LASHAY syndrome.

The cardiac silhouette is stable. No airspace disease, effusion, or
pneumothorax. No acute bony abnormalities.
IMPRESSION: 1. Change in positioning of the defibrillator generator within the
left anterior chest wall, with coiling of the lead around the
generator and retraction of the lead to the region of the superior
vena cava. Findings are compatible with LASHAY syndrome.
2. No acute airspace disease.

Critical Value/emergent results were called by telephone at the time
of interpretation on [DATE] at [DATE] to provider LASHAY ,
who verbally acknowledged these results.

## 2021-07-23 NOTE — ED Notes (Signed)
Pt accepted to [facility] ED to ED per Polly Cobia to transport.

## 2021-07-23 NOTE — ED Notes (Signed)
Chart checked for completion

## 2021-07-23 NOTE — ED Triage Notes (Signed)
Patient reports that intermittently her defibrillator has been going off for one week.

## 2021-07-24 ENCOUNTER — Encounter (HOSPITAL_COMMUNITY): Payer: Self-pay | Admitting: Emergency Medicine

## 2021-07-24 ENCOUNTER — Inpatient Hospital Stay (HOSPITAL_COMMUNITY)
Admission: EM | Admit: 2021-07-24 | Discharge: 2021-07-25 | DRG: 265 | Disposition: A | Discharge status: Home / Self Care | Payer: Medicare Other | Attending: Cardiovascular Disease | Admitting: Cardiovascular Disease

## 2021-07-24 DIAGNOSIS — Z4502 Encounter for adjustment and management of automatic implantable cardiac defibrillator: Principal | ICD-10-CM

## 2021-07-24 DIAGNOSIS — I469 Cardiac arrest, cause unspecified: Secondary | ICD-10-CM | POA: Diagnosis not present

## 2021-07-24 DIAGNOSIS — K219 Gastro-esophageal reflux disease without esophagitis: Secondary | ICD-10-CM | POA: Diagnosis present

## 2021-07-24 DIAGNOSIS — Z79899 Other long term (current) drug therapy: Secondary | ICD-10-CM

## 2021-07-24 DIAGNOSIS — Z6841 Body Mass Index (BMI) 40.0 and over, adult: Secondary | ICD-10-CM | POA: Diagnosis not present

## 2021-07-24 DIAGNOSIS — E7800 Pure hypercholesterolemia, unspecified: Secondary | ICD-10-CM | POA: Diagnosis present

## 2021-07-24 DIAGNOSIS — I10 Essential (primary) hypertension: Secondary | ICD-10-CM | POA: Diagnosis present

## 2021-07-24 DIAGNOSIS — Z8674 Personal history of sudden cardiac arrest: Secondary | ICD-10-CM

## 2021-07-24 DIAGNOSIS — Y712 Prosthetic and other implants, materials and accessory cardiovascular devices associated with adverse incidents: Secondary | ICD-10-CM | POA: Diagnosis present

## 2021-07-24 DIAGNOSIS — Z803 Family history of malignant neoplasm of breast: Secondary | ICD-10-CM

## 2021-07-24 DIAGNOSIS — E785 Hyperlipidemia, unspecified: Secondary | ICD-10-CM | POA: Diagnosis not present

## 2021-07-24 DIAGNOSIS — E669 Obesity, unspecified: Secondary | ICD-10-CM | POA: Diagnosis present

## 2021-07-24 DIAGNOSIS — I447 Left bundle-branch block, unspecified: Secondary | ICD-10-CM | POA: Diagnosis present

## 2021-07-24 DIAGNOSIS — Z20822 Contact with and (suspected) exposure to covid-19: Secondary | ICD-10-CM | POA: Diagnosis present

## 2021-07-24 DIAGNOSIS — T82198A Other mechanical complication of other cardiac electronic device, initial encounter: Secondary | ICD-10-CM

## 2021-07-24 DIAGNOSIS — Z635 Disruption of family by separation and divorce: Secondary | ICD-10-CM

## 2021-07-24 DIAGNOSIS — T82128A Displacement of other cardiac electronic device, initial encounter: Secondary | ICD-10-CM | POA: Diagnosis present

## 2021-07-24 DIAGNOSIS — Z95810 Presence of automatic (implantable) cardiac defibrillator: Secondary | ICD-10-CM

## 2021-07-24 DIAGNOSIS — T82120A Displacement of cardiac electrode, initial encounter: Secondary | ICD-10-CM | POA: Diagnosis present

## 2021-07-24 DIAGNOSIS — T82120D Displacement of cardiac electrode, subsequent encounter: Secondary | ICD-10-CM | POA: Diagnosis not present

## 2021-07-24 LAB — SURGICAL PCR SCREEN
MRSA, PCR: NEGATIVE
Staphylococcus aureus: NEGATIVE

## 2021-07-24 LAB — RESP PANEL BY RT-PCR (FLU A&B, COVID) ARPGX2
Influenza A by PCR: NEGATIVE
Influenza B by PCR: NEGATIVE
SARS Coronavirus 2 by RT PCR: NEGATIVE

## 2021-07-24 MED ORDER — SPIRONOLACTONE 25 MG PO TABS
50.0000 mg | ORAL_TABLET | Freq: Every day | ORAL | Status: DC
  Administered 2021-07-24 – 2021-07-25 (×2): 50 mg via ORAL
  Filled 2021-07-24 (×2): qty 2

## 2021-07-24 MED ORDER — CEFAZOLIN SODIUM-DEXTROSE 2-4 GM/100ML-% IV SOLN
2.0000 g | INTRAVENOUS | Status: AC
  Administered 2021-07-25: 2 g via INTRAVENOUS
  Filled 2021-07-24: qty 100

## 2021-07-24 MED ORDER — PANTOPRAZOLE SODIUM 40 MG PO TBEC: 40.0000 mg | DELAYED_RELEASE_TABLET | Freq: Every day | ORAL | Status: DC

## 2021-07-24 MED ORDER — ASPIRIN EC 81 MG PO TBEC
81.0000 mg | DELAYED_RELEASE_TABLET | Freq: Every day | ORAL | Status: DC
  Administered 2021-07-24 – 2021-07-25 (×2): 81 mg via ORAL
  Filled 2021-07-24 (×2): qty 1

## 2021-07-24 MED ORDER — LOSARTAN POTASSIUM 50 MG PO TABS
100.0000 mg | ORAL_TABLET | Freq: Every day | ORAL | Status: DC
  Administered 2021-07-24 – 2021-07-25 (×2): 100 mg via ORAL
  Filled 2021-07-24 (×2): qty 2

## 2021-07-24 MED ORDER — METOPROLOL SUCCINATE ER 50 MG PO TB24
50.0000 mg | ORAL_TABLET | Freq: Every day | ORAL | Status: DC
  Administered 2021-07-24 – 2021-07-25 (×2): 50 mg via ORAL
  Filled 2021-07-24: qty 1
  Filled 2021-07-24: qty 2

## 2021-07-24 MED ORDER — ATORVASTATIN CALCIUM 10 MG PO TABS
20.0000 mg | ORAL_TABLET | Freq: Every day | ORAL | Status: DC
  Administered 2021-07-24 – 2021-07-25 (×2): 20 mg via ORAL
  Filled 2021-07-24 (×2): qty 2

## 2021-07-24 MED ORDER — SODIUM CHLORIDE 0.9 % IV SOLN: INTRAVENOUS | Status: DC

## 2021-07-24 MED ORDER — PANTOPRAZOLE SODIUM 40 MG PO TBEC
40.0000 mg | DELAYED_RELEASE_TABLET | Freq: Every day | ORAL | Status: DC
  Administered 2021-07-24: 40 mg via ORAL
  Filled 2021-07-24: qty 1

## 2021-07-24 MED ORDER — POTASSIUM CHLORIDE 20 MEQ PO PACK
20.0000 meq | PACK | Freq: Once | ORAL | Status: AC
  Administered 2021-07-24: 20 meq via ORAL
  Filled 2021-07-24: qty 1

## 2021-07-24 MED ORDER — ENOXAPARIN SODIUM 60 MG/0.6ML IJ SOSY
55.0000 mg | PREFILLED_SYRINGE | INTRAMUSCULAR | Status: DC
  Administered 2021-07-24: 55 mg via SUBCUTANEOUS
  Filled 2021-07-24: qty 0.55

## 2021-07-24 MED ORDER — SODIUM CHLORIDE 0.9 % IV SOLN
80.0000 mg | INTRAVENOUS | Status: AC
  Administered 2021-07-25: 80 mg
  Filled 2021-07-24: qty 2

## 2021-07-24 MED ORDER — AMLODIPINE BESYLATE 5 MG PO TABS
5.0000 mg | ORAL_TABLET | Freq: Every day | ORAL | Status: DC
  Administered 2021-07-24 – 2021-07-25 (×2): 5 mg via ORAL
  Filled 2021-07-24 (×2): qty 1

## 2021-07-24 NOTE — ED Notes (Signed)
Breakfast Ordered 

## 2021-07-24 NOTE — ED Provider Notes (Signed)
[facility] Emergency Department Provider Note   ____________________________________________   Event Date/Time   First MD Initiated Contact with Patient 07/23/21 1839     (approximate)  I have reviewed the triage vital signs and the nursing notes.   HISTORY  Chief Complaint AICD Problem    HPI Julie James is a 70 y.o. female who presents complaining of a sensation that her defibrillator has been firing for over 1 week  LOCATION: Chest DURATION: 1 week prior to arrival TIMING: Stable since onset SEVERITY: Severe QUALITY: Shocks CONTEXT: Patient has had a pacemaker/defibrillator in place since 03/09/2021 but over the past week is felt that it could be it is shocking her and her chest MODIFYING FACTORS: Denies any exacerbating or relieving factors ASSOCIATED SYMPTOMS: Mild chest pain   Per medical record review, patient had ICD placed 03/09/2021          Past Medical History:  Diagnosis Date   Cardiac arrest ([place]) 02/27/2021   a.  Witnessed out of hospital cardiac arrest s/p shock x2; b.  LHC 03/04/2021 w/ normal coronary arteries; c. TTE 03/02/21 - EF 55-60%, no RWMA, mild LVH, Gr1DD, nl RVSF and ventricular cavity size, and mild aortic valve sclerosis w/o evidence of stenosis   Hypercholesteremia    Hypertension     Patient Active Problem List   Diagnosis Date Noted   Pacemaker displacement, initial encounter 07/24/2021   Hypokalemia 03/04/2021   Cardiac arrest ([place]) 02/27/2021   GERD (gastroesophageal reflux disease) 08/23/2020   Needs flu shot 08/23/2020   Breast cancer screening by mammogram 03/12/2020   Screening for malignant neoplasm of colon 03/12/2020   Drug-induced hypokalemia 08/07/2019   Lipoma of abdomen 07/30/2017   Lipoma of right thigh 07/30/2017   Hypertension 06/23/2016   Hyperlipidemia 06/23/2016   Thyroid nodule 03/16/2014    Past Surgical History:  Procedure Laterality Date   ICD IMPLANT N/A 03/09/2021    Procedure: ICD IMPLANT;  Surgeon: Deboraha Sprang, MD;  Location: [place] [facility];  Service: Cardiovascular;  Laterality: N/A;   LEFT HEART CATH AND CORONARY ANGIOGRAPHY N/A 03/04/2021   Procedure: LEFT HEART CATH AND CORONARY ANGIOGRAPHY;  Surgeon: Wellington Hampshire, MD;  Location: [place] [facility];  Service: Cardiovascular;  Laterality: N/A;    Prior to Admission medications   Medication Sig Start Date End Date Taking? Authorizing Provider  amLODipine (NORVASC) 5 MG tablet Take 1 tablet (5 mg total) by mouth daily. 05/25/21 08/23/21  Minna Merritts, MD  aspirin 81 MG EC tablet Take 1 tablet (81 mg total) by mouth daily. Swallow whole. 03/10/21   Kroeger, Lorelee Cover., PA-C  atorvastatin (LIPITOR) 20 MG tablet Take 1 tablet (20 mg total) by mouth daily. 03/18/21   Loel Dubonnet, NP  losartan (COZAAR) 100 MG tablet Take 1 tablet (100 mg total) by mouth daily. 03/18/21   Loel Dubonnet, NP  metoprolol succinate (TOPROL-XL) 50 MG 24 hr tablet Take 1 tablet (50 mg total) by mouth daily. Take with or immediately following a meal. 03/18/21 09/14/21  Loel Dubonnet, NP  pantoprazole (PROTONIX) 40 MG tablet Take 1 tablet (40 mg total) by mouth at bedtime. 03/18/21   Loel Dubonnet, NP  spironolactone (ALDACTONE) 50 MG tablet Take 1 tablet (50 mg total) by mouth daily. 03/18/21   Loel Dubonnet, NP    Allergies Chlorthalidone  Family History  Problem Relation Age of Onset   Cancer Sister  breast   Cancer Sister        breast    Social History Social History   Tobacco Use   Smoking status: Never   Smokeless tobacco: Never  Vaping Use   Vaping Use: Never used  Substance Use Topics   Alcohol use: No   Drug use: No    Review of Systems Constitutional: No fever/chills Eyes: No visual changes. ENT: No sore throat. Cardiovascular: Endorses chest pain. Respiratory: Denies shortness of breath. Gastrointestinal: No abdominal pain.  No nausea, no vomiting.  No  diarrhea. Genitourinary: Negative for dysuria. Musculoskeletal: Negative for acute arthralgias Skin: Negative for rash. Neurological: Negative for headaches, weakness/numbness/paresthesias in any extremity Psychiatric: Negative for suicidal ideation/homicidal ideation   ____________________________________________   PHYSICAL EXAM:  VITAL SIGNS: ED Triage Vitals  Enc Vitals Group     BP 07/23/21 1829 (!) 157/63     Pulse Rate 07/23/21 1829 (!) 45     Resp 07/23/21 1829 18     Temp 07/23/21 1829 99 F (37.2 C)     Temp Source 07/23/21 1829 Oral     SpO2 07/23/21 1829 99 %     Weight 07/23/21 1840 250 lb (113.4 kg)     Height 07/23/21 1840 '5\' 6"'$  (1.676 m)     Head Circumference --      Peak Flow --      Pain Score 07/23/21 1834 0     Pain Loc --      Pain Edu? --      Excl. in [place]? --    Constitutional: Alert and oriented. Well appearing and in no acute distress. Eyes: Conjunctivae are normal. PERRL. Head: Atraumatic. Nose: No congestion/rhinnorhea. Mouth/Throat: Mucous membranes are moist. Neck: No stridor Cardiovascular: Grossly normal heart sounds.  Good peripheral circulation. Respiratory: Normal respiratory effort.  No retractions. Gastrointestinal: Soft and nontender. No distention. Musculoskeletal: No obvious deformities Neurologic:  Normal speech and language. No gross focal neurologic deficits are appreciated. Skin: Pacemaker palpated in the left upper chest wall.  Skin is warm and dry. No rash noted. Psychiatric: Mood and affect are normal. Speech and behavior are normal.  ____________________________________________   LABS (all labs ordered are listed, but only abnormal results are displayed)  Labs Reviewed  COMPREHENSIVE METABOLIC PANEL - Abnormal; Notable for the following components:      Result Value   Glucose, Bld 106 (*)    Total Protein 8.7 (*)    All other components within normal limits  BRAIN NATRIURETIC PEPTIDE - Abnormal; Notable for the  following components:   B Natriuretic Peptide 228.6 (*)    All other components within normal limits  CBC WITH DIFFERENTIAL/PLATELET  TROPONIN I (HIGH SENSITIVITY)   ____________________________________________  EKG  ED ECG REPORT I, Naaman Plummer, the attending physician, personally viewed and interpreted this ECG.  Date: 07/24/2021 EKG Time: 2311 Rate: 73 Rhythm: normal sinus rhythm QRS Axis: normal Intervals: LBBB ST/T Wave abnormalities: normal Narrative Interpretation: LBBB.  No evidence of acute ischemia  ____________________________________________  RADIOLOGY  ED MD interpretation: Single view chest x-ray shows change of positioning of defibrillator generator within the left anterior chest wall with coiling of the liter of the generator and retraction of the lead to the region of the [facility]  Official radiology report(s): DG Chest Port 1 View  Result Date: 07/23/2021 CLINICAL DATA:  Chest pain, defibrillator firing intermittently for 1 week EXAM: PORTABLE CHEST 1 VIEW COMPARISON:  03/09/2021 FINDINGS: Single frontal view of the chest demonstrates interval  change in positioning of the implanted defibrillator. Since the previous exam, the lead has retracted, now in the region of the superior vena cava, with coiling of the lead surrounding the generator, which has changed orientation within the left anterior chest wall. Findings are consistent with reel syndrome. The cardiac silhouette is stable. No airspace disease, effusion, or pneumothorax. No acute bony abnormalities. IMPRESSION: 1. Change in positioning of the defibrillator generator within the left anterior chest wall, with coiling of the lead around the generator and retraction of the lead to the region of the superior vena cava. Findings are compatible with reel syndrome. 2. No acute airspace disease. Critical Value/emergent results were called by telephone at the time of interpretation on 07/23/2021 at 9:42 pm to provider [facility] , who verbally acknowledged these results. Electronically Signed   By: Randa Ngo M.D.   On: 07/23/2021 21:49    ____________________________________________   PROCEDURES  Procedure(s) performed (including Critical Care):  Procedures   ____________________________________________   INITIAL IMPRESSION / ASSESSMENT AND PLAN / ED COURSE  As part of my medical decision making, I reviewed the following data within the electronic medical record, if available:  Nursing notes reviewed and incorporated, Labs reviewed, EKG interpreted, Old chart reviewed, Radiograph reviewed and Notes from prior ED visits reviewed and incorporated        Patient is a 70 year old female that presents for the sensation that her AICD is shocking her.  Differential diagnosis includes but is not limited to: Pacer failure, ACS, acute CHF, pneumonia  Chest x-ray shows findings consistent with reel syndrome  I spoke to the on-call electrophysiology cardiologist at [facility] who agreed to accept patient in transfer from ER to ER.  I spoke to Dr. Sherilyn Cooter in emergency medicine who agrees to accept this patient as transfer  Dispo: Transfer to [facility]      ____________________________________________   FINAL CLINICAL IMPRESSION(S) / ED DIAGNOSES  Final diagnoses:  Aicd mechanical complication     ED Discharge Orders     None        Note:  This document was prepared using Dragon voice recognition software and may include unintentional dictation errors.    Naaman Plummer, MD 07/24/21 [phone]

## 2021-07-24 NOTE — ED Triage Notes (Signed)
Pt transferred from [facility]; defibrillator probes have migrated into superior vena cava

## 2021-07-24 NOTE — ED Provider Notes (Signed)
Patient from [place] here for cardiology  Cardiology consulted, Dr. Conley Canal  NCAT EOMI RRR CTAB NABS FROM x4  To be seen by cardiology    Conde, Melis Trochez, MD 07/24/21 0045

## 2021-07-24 NOTE — ED Notes (Signed)
RN ordered pt lunch and breakfast tray

## 2021-07-24 NOTE — Consult Note (Signed)
Cardiology Consultation:   Patient ID: Julie James MRN: [id number]; DOB: 1950/12/28  Admit date: 07/24/2021 Date of Consult: 07/24/2021  PCP:  Verl Bangs, [place] [facility] Cardiologist:  Ida Rogue, MD  Electrophysiologist:  Virl Axe, MD       Patient Profile:   Julie James is a 70 y.o. female with a hx of aborted cardiac arrest who is being seen 07/24/2021 for the evaluation of displaced ICD lead at the request of Dr. Conley Canal.  History of Present Illness:   Julie James suffered a cardiac arrest 3/27.  Evaluation included normal LV function by echo, normal coronary arteries and cMRI demonstrated mild LV dysfunction (EF 46%) septal dyssynchrony and mild hypokinesis.  She underwent ICD implantation with a [employer] device 4/22.  For the last week, she has described "spasm "in her chest, as if Hiccups on the right side  Had a sensation that her ICD had moved prior to that.  Because of persistent symptoms, she went to the emergency room her chest x-ray demonstrated flipping of her ICD pulse generator and retraction of her lead into the innominate vein.   Past Medical History:  Diagnosis Date   Cardiac arrest ([place]) 02/27/2021   a.  Witnessed out of hospital cardiac arrest s/p shock x2; b.  LHC 03/04/2021 w/ normal coronary arteries; c. TTE 03/02/21 - EF 55-60%, no RWMA, mild LVH, Gr1DD, nl RVSF and ventricular cavity size, and mild aortic valve sclerosis w/o evidence of stenosis   Hypercholesteremia    Hypertension     Past Surgical History:  Procedure Laterality Date   ICD IMPLANT N/A 03/09/2021   Procedure: ICD IMPLANT;  Surgeon: Deboraha Sprang, MD;  Location: [place] [facility];  Service: Cardiovascular;  Laterality: N/A;   LEFT HEART CATH AND CORONARY ANGIOGRAPHY N/A 03/04/2021   Procedure: LEFT HEART CATH AND CORONARY ANGIOGRAPHY;  Surgeon: Wellington Hampshire, MD;  Location: [place] [facility];  Service: Cardiovascular;  Laterality: N/A;        Inpatient Medications: Scheduled Meds:  amLODipine  5 mg Oral Daily   aspirin EC  81 mg Oral Daily   atorvastatin  20 mg Oral Daily   enoxaparin (LOVENOX) injection  55 mg Subcutaneous Q24H   losartan  100 mg Oral Daily   metoprolol succinate  50 mg Oral Daily   pantoprazole  40 mg Oral QHS   spironolactone  50 mg Oral Daily      P    Allergies:    Allergies  Allergen Reactions   Chlorthalidone Other (See Comments)    hypokalemia    Social History:   Social History   Socioeconomic History   Marital status: Legally Separated    Spouse name: Teacher, music   Number of children: 2   Years of education: Not on file   Highest education level: Not on file  Occupational History   Not on file  Tobacco Use   Smoking status: Never   Smokeless tobacco: Never  Vaping Use   Vaping Use: Never used  Substance and Sexual Activity   Alcohol use: No   Drug use: No   Sexual activity: Not on file  Other Topics Concern   Not on file  Social History Narrative   Not on file   Social Determinants of Health   Financial Resource Strain: Not on file  Food Insecurity: Not on file  Transportation Needs: Not on file  Physical Activity: Not on file  Stress: Not on file  Social Connections: Not on file  Intimate Partner Violence: Not on file    Family History:    Family History  Problem Relation Age of Onset   Cancer Sister        breast   Cancer Sister        breast     ROS:  Please see the history of present illness.   All other ROS reviewed and negative.     Physical Exam/Data:   Vitals:   07/24/21 0733 07/24/21 0845 07/24/21 1130 07/24/21 1221  BP:  123/60 (!) 124/44 126/60  Pulse:  65 63 69  Resp:  '18 14 16  '$ Temp: 98 F (36.7 C)   98 F (36.7 C)  TempSrc: Oral   Oral  SpO2:  99% 100%   Weight:    112.6 kg  Height:    '5\' 6"'$  (1.676 m)   No intake or output data in the 24 hours ending 07/24/21 1343 Last 3 Weights 07/24/2021 07/23/2021 06/21/2021   Weight (lbs) 248 lb 4.8 oz 250 lb 254 lb  Weight (kg) 112.628 kg 113.399 kg 115.214 kg     Body mass index is 40.08 kg/m.  General:  Well nourished, well developed, in no acute distress  HEENT: normal Lymph: no adenopathy Neck: no JVD Endocrine:  No thryomegaly Vascular: No carotid bruits; FA pulses 2+ bilaterally without bruits  Cardiac:  normal S1, S2; RRR; no murmur   Lungs:  clear to auscultation bilaterally, no wheezing, rhonchi or rales  Abd: soft, nontender, no hepatomegaly  Ext: no edema Musculoskeletal:  No deformities, BUE and BLE strength normal and equal Skin: warm and dry  Neuro:  CNs 2-12 intact, no focal abnormalities noted Psych:  Normal affect   EKG:  The EKG was personally reviewed and demonstrates:  inappropriate pacing spikes Telemetry:  Telemetry was personally reviewed and demonstrates:  sinus   Relevant CV Studies:    Laboratory Data:  High Sensitivity Troponin:   Recent Labs  Lab 07/23/21 1847  TROPONINIHS 7     Chemistry Recent Labs  Lab 07/23/21 1847  NA 139  K 3.7  CL 105  CO2 26  GLUCOSE 106*  BUN 15  CREATININE 0.90  CALCIUM 9.4  GFRNONAA >60  ANIONGAP 8    Recent Labs  Lab 07/23/21 1847  PROT 8.7*  ALBUMIN 4.4  AST 23  ALT 17  ALKPHOS 89  BILITOT 0.5   Hematology Recent Labs  Lab 07/23/21 1847  WBC 7.8  RBC 4.19  HGB 12.0  HCT 37.1  MCV 88.5  MCH 28.6  MCHC 32.3  RDW 14.1  PLT 373   BNP Recent Labs  Lab 07/23/21 1847  BNP 228.6*    DDimer No results for input(s): DDIMER in the last 168 hours.   Radiology/Studies:  DG Chest Port 1 View  Result Date: 07/23/2021 CLINICAL DATA:  Chest pain, defibrillator firing intermittently for 1 week EXAM: PORTABLE CHEST 1 VIEW COMPARISON:  03/09/2021 FINDINGS: Single frontal view of the chest demonstrates interval change in positioning of the implanted defibrillator. Since the previous exam, the lead has retracted, now in the region of the superior vena cava, with  coiling of the lead surrounding the generator, which has changed orientation within the left anterior chest wall. Findings are consistent with reel syndrome. The cardiac silhouette is stable. No airspace disease, effusion, or pneumothorax. No acute bony abnormalities. IMPRESSION: 1. Change in positioning of the defibrillator generator within the left anterior chest wall, with coiling of  the lead around the generator and retraction of the lead to the region of the superior vena cava. Findings are compatible with reel syndrome. 2. No acute airspace disease. Critical Value/emergent results were called by telephone at the time of interpretation on 07/23/2021 at 9:42 pm to provider [facility] , who verbally acknowledged these results. Electronically Signed   By: Randa Ngo M.D.   On: 07/23/2021 21:49     Assessment and Plan:   Aborted cardiac arrest-idiopathic  ICD-[employer]  Twiddling/macro dislodgment with retraction of the lead into the innominate vein and flipping of the pulse generator  Hypertension  The patient has retraction of her ICD lead into the innominate vein.  There is also been rotation of the pulse generator suggesting mechanical dislodgment.  She will need to undergo device lead replacement. All functions are off and symptoms have abated  Have reviewed the potential benefits and risks of ICD implantation including but not limited to death, perforation of heart or lung, lead dislodgement, infection,  device malfunction and inappropriate shocks.  The patient express understanding w share decision making and are willing to proceed.        Risk Assessment/Risk Scores:      For questions or updates, please contact [place] Please consult [email or web address] for contact info under    Signed, Virl Axe, MD  07/24/2021 1:43 PM

## 2021-07-24 NOTE — H&P (Signed)
Cardiology Admission History and Physical:   Patient ID: Julie James MRN: [id number]; DOB: 1951/11/10   Admission date: 07/24/2021  PCP:  Verl Bangs, [place] [facility] Cardiologist:  Ida Rogue, MD  Electrophysiologist:  Virl Axe, MD       Chief Complaint: Chest spasming  Patient Profile:   Julie James is a 70 y.o. female with VT arrest s/p secondary prevention [employer] ICD placement (03/09/21), HTN, HLD, obesity, and GERD who is being seen 07/24/2021 for the evaluation of pacemaker malfunction and dislodgment.  History of Present Illness:   Julie James unfortunately suffered cardiac arrest secondary to a WCT back in April of this year.  Thankfully ROSC was achieved and the patient survived.  During that hospitalization she underwent implantation of a secondary prevention ICD since there were no reversible causes of her arrest identified.  Since that hospitalization she was in her usual state of health until about a week ago when she started having "spasming" of her chest.  She states that the spasming is constant, but not painful.  Prior to the onset of the spasming she felt like there was movement in her ICD pocket, but was not sure.  She called cardiology on 8/16 regarding the symptoms, but it was felt that her symptoms were likely noncardiac given the description.  Her symptoms persisted however, so she presented to the ED for evaluation.  In the ED CXR revealed that her ICD lead had become dislodged and was present in the SVC.  Her vital signs and labs are all stable.  She was transferred to [facility] for EP evaluation.  On examination the patient says that she feels totally fine except for spasming.  Labs were all fairly unremarkable.  She has admitted to cardiology for ongoing care   Past Medical History:  Diagnosis Date   Cardiac arrest ([place]) 02/27/2021   a.  Witnessed out of hospital cardiac arrest s/p shock x2; b.  LHC 03/04/2021 w/  normal coronary arteries; c. TTE 03/02/21 - EF 55-60%, no RWMA, mild LVH, Gr1DD, nl RVSF and ventricular cavity size, and mild aortic valve sclerosis w/o evidence of stenosis   Hypercholesteremia    Hypertension     Past Surgical History:  Procedure Laterality Date   ICD IMPLANT N/A 03/09/2021   Procedure: ICD IMPLANT;  Surgeon: Deboraha Sprang, MD;  Location: [facility];  Service: Cardiovascular;  Laterality: N/A;   LEFT HEART CATH AND CORONARY ANGIOGRAPHY N/A 03/04/2021   Procedure: LEFT HEART CATH AND CORONARY ANGIOGRAPHY;  Surgeon: Wellington Hampshire, MD;  Location: [facility];  Service: Cardiovascular;  Laterality: N/A;     Medications Prior to Admission: Prior to Admission medications   Medication Sig Start Date End Date Taking? Authorizing Provider  amLODipine (NORVASC) 5 MG tablet Take 1 tablet (5 mg total) by mouth daily. 05/25/21 08/23/21 Yes Minna Merritts, MD  aspirin 81 MG EC tablet Take 1 tablet (81 mg total) by mouth daily. Swallow whole. 03/10/21  Yes Kroeger, Daleen Snook M., PA-C  atorvastatin (LIPITOR) 20 MG tablet Take 1 tablet (20 mg total) by mouth daily. 03/18/21  Yes Loel Dubonnet, NP  losartan (COZAAR) 100 MG tablet Take 1 tablet (100 mg total) by mouth daily. 03/18/21  Yes Loel Dubonnet, NP  metoprolol succinate (TOPROL-XL) 50 MG 24 hr tablet Take 1 tablet (50 mg total) by mouth daily. Take with or immediately following a meal. 03/18/21 09/14/21 Yes Loel Dubonnet, NP  pantoprazole (PROTONIX) 40 MG tablet Take 1 tablet (40 mg total) by mouth at bedtime. 03/18/21  Yes Loel Dubonnet, NP  spironolactone (ALDACTONE) 50 MG tablet Take 1 tablet (50 mg total) by mouth daily. 03/18/21  Yes Loel Dubonnet, NP     Allergies:    Allergies  Allergen Reactions   Chlorthalidone Other (See Comments)    hypokalemia    Social History:   Social History   Socioeconomic History   Marital status: Legally Separated    Spouse name: Teacher, music   Number  of children: 2   Years of education: Not on file   Highest education level: Not on file  Occupational History   Not on file  Tobacco Use   Smoking status: Never   Smokeless tobacco: Never  Vaping Use   Vaping Use: Never used  Substance and Sexual Activity   Alcohol use: No   Drug use: No   Sexual activity: Not on file  Other Topics Concern   Not on file  Social History Narrative   Not on file   Social Determinants of Health   Financial Resource Strain: Not on file  Food Insecurity: Not on file  Transportation Needs: Not on file  Physical Activity: Not on file  Stress: Not on file  Social Connections: Not on file  Intimate Partner Violence: Not on file    Family History:   The patient's family history includes Cancer in her sister and sister.    ROS:  Please see the history of present illness.  All other ROS reviewed and negative.     Physical Exam/Data:   Vitals:   07/24/21 0015 07/24/21 0030  BP: 139/62 139/65  Pulse: 66 69  Resp: 19 20  Temp: 98.3 F (36.8 C)   TempSrc: Oral   SpO2: 100% 100%   No intake or output data in the 24 hours ending 07/24/21 0132 Last 3 Weights 07/23/2021 06/21/2021 05/25/2021  Weight (lbs) 250 lb 254 lb 255 lb 8 oz  Weight (kg) 113.399 kg 115.214 kg 115.894 kg     There is no height or weight on file to calculate BMI.  General:  Well nourished, well developed, in no acute distress, very pleasant HEENT: normal Neck: no JVD Cardiac:  normal S1, S2; RRR; no murmur; visible spasming of the right hemithorax Lungs:  clear to auscultation bilaterally, no wheezing, rhonchi or rales  Abd: soft, nontender, no hepatomegaly  Ext: no edema Musculoskeletal:  No deformities, BUE and BLE strength normal and equal Skin: warm and dry  Neuro:  CNs 2-12 intact, no focal abnormalities noted Psych:  Normal affect    EKG:  The ECG that was done  was personally reviewed and demonstrates NSR, LBBB and under sensing and loss of capture of ICD in the  setting of a magnet     Relevant CV Studies:  LHC 03/04/21:  The left ventricular systolic function is normal. LV end diastolic pressure is normal. The left ventricular ejection fraction is 55-65% by visual estimate.   1.  Normal coronary arteries. 2.  Normal LV systolic function and left ventricular end-diastolic pressure.  Wall motion could not be fully evaluated.   TTE 03/02/21:  IMPRESSIONS     1. Left ventricular ejection fraction, by estimation, is 55 to 60%. The  left ventricle has normal function. The left ventricle has no regional  wall motion abnormalities. There is mild left ventricular hypertrophy.  Left ventricular diastolic parameters  are consistent with Grade I  diastolic dysfunction (impaired relaxation).   2. Right ventricular systolic function is normal. The right ventricular  size is normal. Tricuspid regurgitation signal is inadequate for assessing  PA pressure.   3. The mitral valve is normal in structure. No evidence of mitral valve  regurgitation. No evidence of mitral stenosis.   4. The aortic valve is normal in structure. Aortic valve regurgitation is  not visualized. Mild aortic valve sclerosis is present, with no evidence  of aortic valve stenosis.   5. The inferior vena cava is normal in size with greater than 50%  respiratory variability, suggesting right atrial pressure of 3 mmHg.   6. Challenging image quality.   Laboratory Data:  High Sensitivity Troponin:   Recent Labs  Lab 07/23/21 1847  TROPONINIHS 7      Chemistry Recent Labs  Lab 07/23/21 1847  NA 139  K 3.7  CL 105  CO2 26  GLUCOSE 106*  BUN 15  CREATININE 0.90  CALCIUM 9.4  GFRNONAA >60  ANIONGAP 8    Recent Labs  Lab 07/23/21 1847  PROT 8.7*  ALBUMIN 4.4  AST 23  ALT 17  ALKPHOS 89  BILITOT 0.5   Hematology Recent Labs  Lab 07/23/21 1847  WBC 7.8  RBC 4.19  HGB 12.0  HCT 37.1  MCV 88.5  MCH 28.6  MCHC 32.3  RDW 14.1  PLT 373   BNP Recent Labs   Lab 07/23/21 1847  BNP 228.6*    DDimer No results for input(s): DDIMER in the last 168 hours.   Radiology/Studies:  DG Chest Port 1 View  Result Date: 07/23/2021 CLINICAL DATA:  Chest pain, defibrillator firing intermittently for 1 week EXAM: PORTABLE CHEST 1 VIEW COMPARISON:  03/09/2021 FINDINGS: Single frontal view of the chest demonstrates interval change in positioning of the implanted defibrillator. Since the previous exam, the lead has retracted, now in the region of the superior vena cava, with coiling of the lead surrounding the generator, which has changed orientation within the left anterior chest wall. Findings are consistent with reel syndrome. The cardiac silhouette is stable. No airspace disease, effusion, or pneumothorax. No acute bony abnormalities. IMPRESSION: 1. Change in positioning of the defibrillator generator within the left anterior chest wall, with coiling of the lead around the generator and retraction of the lead to the region of the superior vena cava. Findings are compatible with reel syndrome. 2. No acute airspace disease. Critical Value/emergent results were called by telephone at the time of interpretation on 07/23/2021 at 9:42 pm to provider [facility] , who verbally acknowledged these results. Electronically Signed   By: Randa Ngo M.D.   On: 07/23/2021 21:49     Assessment and Plan:   Julie James is a 70 y.o. female with VT arrest s/p secondary prevention Sunset ICD placement (03/09/21), HTN, HLD, obesity, and GERD who is being seen 07/24/2021 for the evaluation of pacemaker malfunction and dislodgment.  #ICD Lead Displacement :: Patient's ICD lead has become dislodged and is present in the SVC.  Perhaps due to reel syndrome?  Her lead has been inappropriately pacing her and thus she has been having muscle spasms in her chest.  I interrogated her device and turned off all tachycardia therapies and pacing function.  Ultimately she is  going to have to undergo revision of this lead with the help of our EP colleagues.  We will admit to cardiology. -Turned off tacky therapies and pacing function; spasming has stopped -Speak with EP  regarding lead revision  #HTN #HLD -Continue amlodipine 5 mg daily -Continue losartan 100 mg daily -Continue metoprolol succinate 50 mg daily -Continue spironolactone 50 mg daily -Continue atorvastatin 20 mg daily    Risk Assessment/Risk Scores:           Severity of Illness: The appropriate patient status for this patient is INPATIENT. Inpatient status is judged to be reasonable and necessary in order to provide the required intensity of service to ensure the patient's safety. The patient's presenting symptoms, physical exam findings, and initial radiographic and laboratory data in the context of their chronic comorbidities is felt to place them at high risk for further clinical deterioration. Furthermore, it is not anticipated that the patient will be medically stable for discharge from the hospital within 2 midnights of admission. The following factors support the patient status of inpatient.   " The patient's presenting symptoms include pacemaker lead malfunction. " The worrisome physical exam findings include chest spasming. " The initial radiographic and laboratory data are worrisome because of dislodged pacemaker lead. " The chronic co-morbidities include HTN, HLD, GERD, obesity.   * I certify that at the point of admission it is my clinical judgment that the patient will require inpatient hospital care spanning beyond 2 midnights from the point of admission due to high intensity of service, high risk for further deterioration and high frequency of surveillance required.*   For questions or updates, please contact [place] Please consult [email or web address] for contact info under     Signed, Hershal Coria, MD  07/24/2021 1:32 AM

## 2021-07-25 ENCOUNTER — Encounter (HOSPITAL_COMMUNITY): Payer: Self-pay | Admitting: Internal Medicine

## 2021-07-25 ENCOUNTER — Inpatient Hospital Stay (HOSPITAL_COMMUNITY)
Admission: EM | Disposition: A | Discharge status: Home / Self Care | Payer: Self-pay | Source: Home / Self Care | Attending: Cardiovascular Disease

## 2021-07-25 ENCOUNTER — Inpatient Hospital Stay (HOSPITAL_COMMUNITY): Payer: Medicare Other

## 2021-07-25 DIAGNOSIS — T82120D Displacement of cardiac electrode, subsequent encounter: Secondary | ICD-10-CM

## 2021-07-25 HISTORY — PX: LEAD REVISION/REPAIR: EP1213

## 2021-07-25 IMAGING — DX DG CHEST 2V
2 series · 2 of 2 positions shown · non-contrast
Comparison: [DATE]

CLINICAL DATA: ICD placement

EXAM:
CHEST - 2 VIEW

[chest lat]
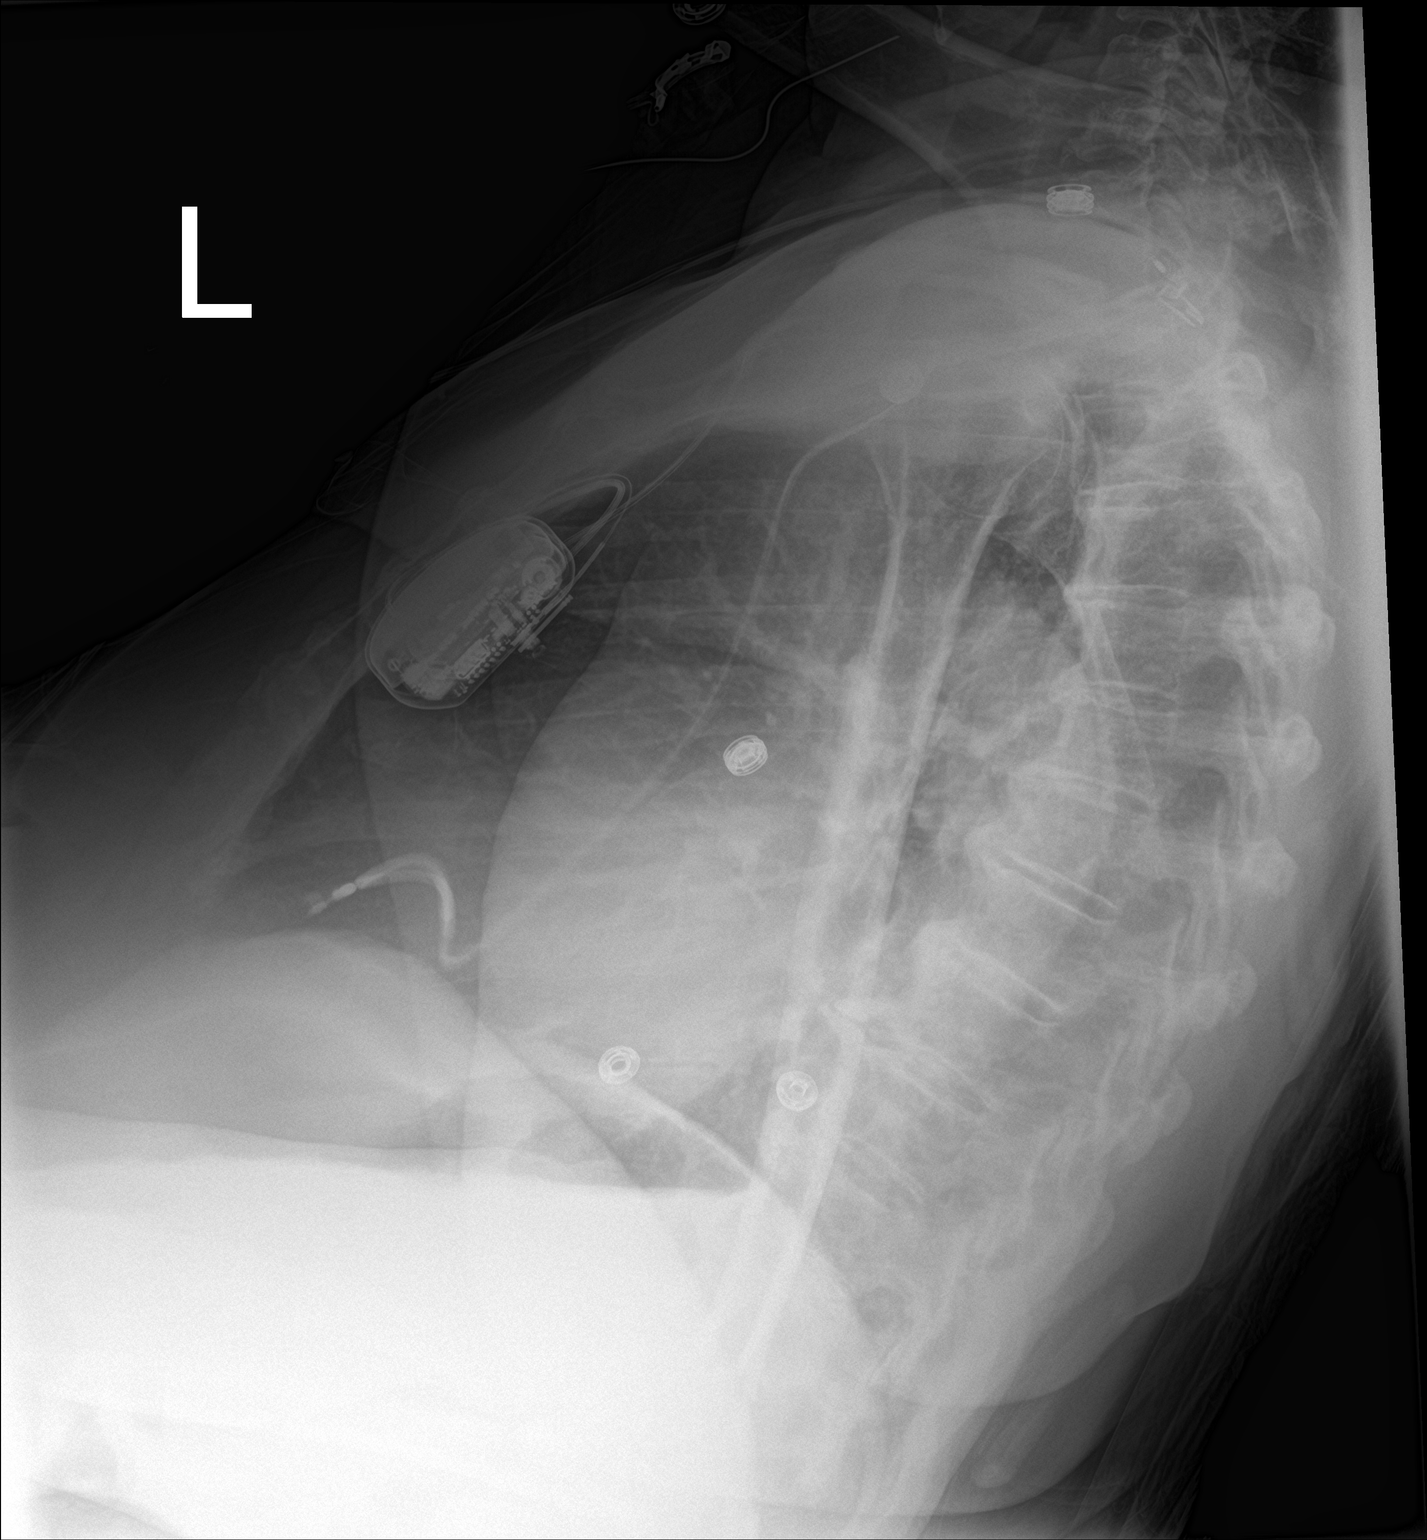

[chest ap]
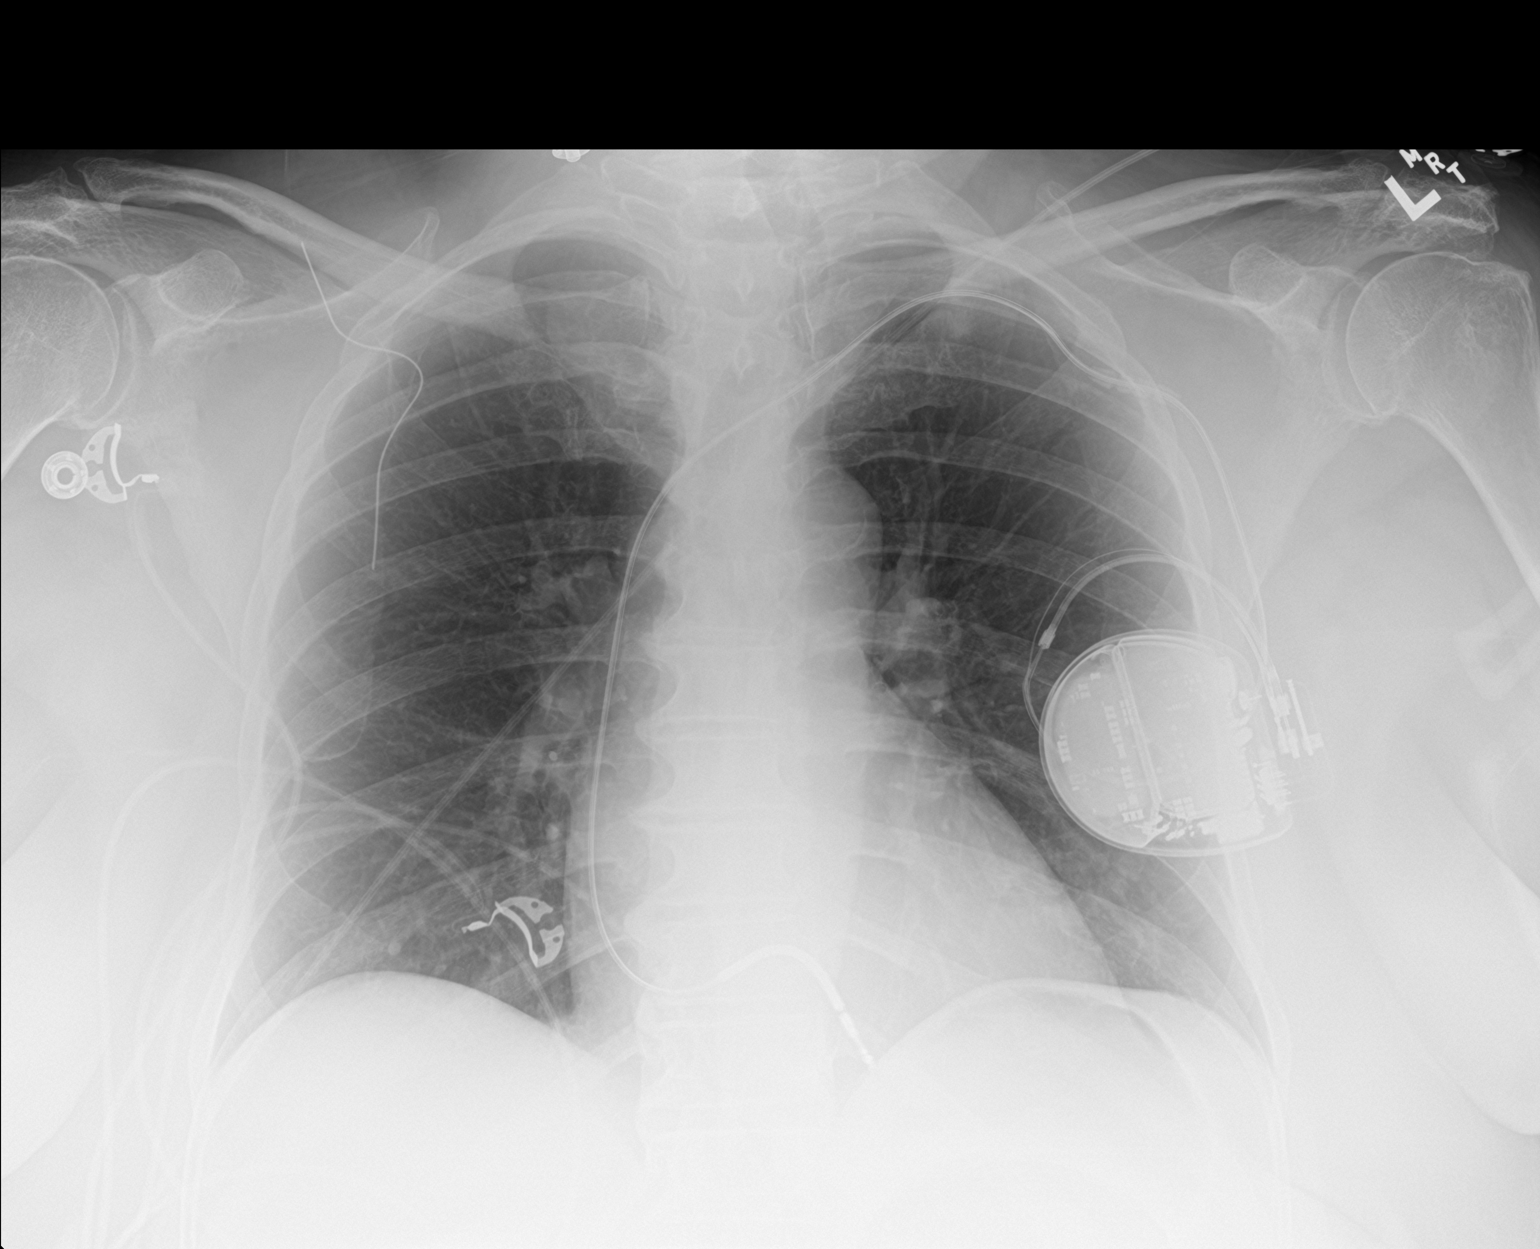

[2 of 2 positions shown; findings below may reference images not displayed]

FINDINGS: Left chest wall ICD is identified with intact lead in the right
ventricle. No pneumothorax identified. The heart size normal. No
pleural effusion or edema. No airspace densities.
IMPRESSION: No active cardiopulmonary abnormalities.

No pneumothorax after ICD revision.

## 2021-07-25 SURGERY — LEAD REVISION/REPAIR
Anesthesia: LOCAL

## 2021-07-25 MED ORDER — FENTANYL CITRATE (PF) 100 MCG/2ML IJ SOLN
INTRAMUSCULAR | Status: DC | PRN
  Administered 2021-07-25: 25 ug via INTRAVENOUS
  Administered 2021-07-25 (×2): 12.5 ug via INTRAVENOUS

## 2021-07-25 MED ORDER — LIDOCAINE HCL (PF) 1 % IJ SOLN
INTRAMUSCULAR | Status: DC | PRN
  Administered 2021-07-25: 60 mL

## 2021-07-25 MED ORDER — CEFAZOLIN SODIUM-DEXTROSE 1-4 GM/50ML-% IV SOLN
1.0000 g | Freq: Once | INTRAVENOUS | Status: AC
Start: 2021-07-25 — End: 2021-07-25
  Administered 2021-07-25: 1 g via INTRAVENOUS
  Filled 2021-07-25: qty 50

## 2021-07-25 MED ORDER — ONDANSETRON HCL 4 MG/2ML IJ SOLN: 4.0000 mg | Freq: Four times a day (QID) | INTRAMUSCULAR | Status: DC | PRN

## 2021-07-25 MED ORDER — LIDOCAINE HCL (PF) 1 % IJ SOLN
INTRAMUSCULAR | Status: AC
  Filled 2021-07-25: qty 60

## 2021-07-25 MED ORDER — CEFAZOLIN SODIUM-DEXTROSE 2-4 GM/100ML-% IV SOLN
INTRAVENOUS | Status: AC
  Filled 2021-07-25: qty 100

## 2021-07-25 MED ORDER — ACETAMINOPHEN 325 MG PO TABS
325.0000 mg | ORAL_TABLET | ORAL | Status: DC | PRN
  Administered 2021-07-25: 650 mg via ORAL
  Filled 2021-07-25: qty 2

## 2021-07-25 MED ORDER — MIDAZOLAM HCL 5 MG/5ML IJ SOLN
INTRAMUSCULAR | Status: DC | PRN
  Administered 2021-07-25: 2 mg via INTRAVENOUS
  Administered 2021-07-25 (×2): 1 mg via INTRAVENOUS

## 2021-07-25 MED ORDER — SODIUM CHLORIDE 0.9 % IV SOLN
INTRAVENOUS | Status: AC
  Filled 2021-07-25: qty 2

## 2021-07-25 MED ORDER — MIDAZOLAM HCL 5 MG/5ML IJ SOLN
INTRAMUSCULAR | Status: AC
  Filled 2021-07-25: qty 5

## 2021-07-25 MED ORDER — FENTANYL CITRATE (PF) 100 MCG/2ML IJ SOLN
INTRAMUSCULAR | Status: AC
  Filled 2021-07-25: qty 2

## 2021-07-25 MED ORDER — HEPARIN (PORCINE) IN NACL 1000-0.9 UT/500ML-% IV SOLN
INTRAVENOUS | Status: AC
  Filled 2021-07-25: qty 500

## 2021-07-25 SURGICAL SUPPLY — 5 items
CABLE SURGICAL S-101-97-12 (CABLE) ×2 IMPLANT
LEAD DURATA 7122-65CM (Lead) ×1 IMPLANT
MAT PREVALON FULL STRYKER (MISCELLANEOUS) ×1 IMPLANT
SHEATH 7FR PRELUDE SNAP 13 (SHEATH) ×1 IMPLANT
TRAY PACEMAKER INSERTION (PACKS) ×2 IMPLANT

## 2021-07-25 NOTE — Progress Notes (Addendum)
Progress Note  Patient Name: Julie James Date of Encounter: 07/25/2021  [facility] Cardiologist: Ida Rogue, MD  EP-Dr. Caryl Comes  Subjective   Doing OK, no complaints  Inpatient Medications    Scheduled Meds:  amLODipine  5 mg Oral Daily   aspirin EC  81 mg Oral Daily   atorvastatin  20 mg Oral Daily   gentamicin irrigation  80 mg Irrigation On Call   losartan  100 mg Oral Daily   metoprolol succinate  50 mg Oral Daily   pantoprazole  40 mg Oral QHS   spironolactone  50 mg Oral Daily   Continuous Infusions:  sodium chloride 50 mL/hr at 07/25/21 0636   sodium chloride 50 mL/hr at 07/25/21 [id number]    ceFAZolin (ANCEF) IV     PRN Meds:    Vital Signs    Vitals:   07/24/21 1712 07/24/21 1942 07/25/21 0004 07/25/21 0431  BP: 132/62 119/69 133/61 (!) 131/58  Pulse: 67 63 64 60  Resp: '16 18 17 16  '$ Temp: 98 F (36.7 C) 97.9 F (36.6 C) 97.9 F (36.6 C) 97.9 F (36.6 C)  TempSrc: Oral Oral Oral Oral  SpO2: 100% 100% 100% 100%  Weight:      Height:        Intake/Output Summary (Last 24 hours) at 07/25/2021 [id number] Last data filed at 07/24/2021 1958 Gross per 24 hour  Intake 120 ml  Output --  Net 120 ml   Last 3 Weights 07/24/2021 07/23/2021 06/21/2021  Weight (lbs) 248 lb 4.8 oz 250 lb 254 lb  Weight (kg) 112.628 kg 113.399 kg 115.214 kg      Telemetry    SR, occ OVCs, infrequent brief periods of V bigeminy, rare couplet - Personally Reviewed  ECG    No new EKGs - Personally Reviewed  Physical Exam   GEN: No acute distress.   Neck: No JVD Cardiac: RRR, no murmurs, rubs, or gallops.  Respiratory: CTA b/l. GI: Soft, nontender, non-distended  MS: No edema; No deformity. Neuro:  Nonfocal  Psych: Normal affect   Labs    High Sensitivity Troponin:   Recent Labs  Lab 07/23/21 1847  TROPONINIHS 7      Chemistry Recent Labs  Lab 07/23/21 1847  NA 139  K 3.7  CL 105  CO2 26  GLUCOSE 106*  BUN 15  CREATININE 0.90  CALCIUM 9.4  PROT  8.7*  ALBUMIN 4.4  AST 23  ALT 17  ALKPHOS 89  BILITOT 0.5  GFRNONAA >60  ANIONGAP 8     Hematology Recent Labs  Lab 07/23/21 1847  WBC 7.8  RBC 4.19  HGB 12.0  HCT 37.1  MCV 88.5  MCH 28.6  MCHC 32.3  RDW 14.1  PLT 373    BNP Recent Labs  Lab 07/23/21 1847  BNP 228.6*     DDimer No results for input(s): DDIMER in the last 168 hours.   Radiology    DG Chest Port 1 View  Result Date: 07/23/2021 CLINICAL DATA:  Chest pain, defibrillator firing intermittently for 1 week EXAM: PORTABLE CHEST 1 VIEW COMPARISON:  03/09/2021 FINDINGS: Single frontal view of the chest demonstrates interval change in positioning of the implanted defibrillator. Since the previous exam, the lead has retracted, now in the region of the superior vena cava, with coiling of the lead surrounding the generator, which has changed orientation within the left anterior chest wall. Findings are consistent with reel syndrome. The cardiac silhouette is stable. No airspace disease,  effusion, or pneumothorax. No acute bony abnormalities. IMPRESSION: 1. Change in positioning of the defibrillator generator within the left anterior chest wall, with coiling of the lead around the generator and retraction of the lead to the region of the superior vena cava. Findings are compatible with reel syndrome. 2. No acute airspace disease. Critical Value/emergent results were called by telephone at the time of interpretation on 07/23/2021 at 9:42 pm to provider [facility] , who verbally acknowledged these results. Electronically Signed   By: Randa Ngo M.D.   On: 07/23/2021 21:49    Cardiac Studies   03/07/21: c.MRI IMPRESSION: 1. Normal LV size with mild concentric LV hypertrophy. Septal-lateral dyssynchrony consistent with LBBB, also mild inferior and inferolateral hypokinesis. EF 46%.   2.  Normal RV size and systolic function, EF [id number].   3. No myocardial LGE, so no definitive evidence for prior MI, infiltrative  disease, or myocarditis. Normal extracellular fluid volume percentage.  03/04/21: LHC The left ventricular systolic function is normal. LV end diastolic pressure is normal. The left ventricular ejection fraction is 55-65% by visual estimate.   1.  Normal coronary arteries. 2.  Normal LV systolic function and left ventricular end-diastolic pressure.  Wall motion could not be fully evaluated.   03/02/2021: TTE IMPRESSIONS   1. Left ventricular ejection fraction, by estimation, is 55 to 60%. The  left ventricle has normal function. The left ventricle has no regional  wall motion abnormalities. There is mild left ventricular hypertrophy.  Left ventricular diastolic parameters  are consistent with Grade I diastolic dysfunction (impaired relaxation).   2. Right ventricular systolic function is normal. The right ventricular  size is normal. Tricuspid regurgitation signal is inadequate for assessing  PA pressure.   3. The mitral valve is normal in structure. No evidence of mitral valve  regurgitation. No evidence of mitral stenosis.   4. The aortic valve is normal in structure. Aortic valve regurgitation is  not visualized. Mild aortic valve sclerosis is present, with no evidence  of aortic valve stenosis.   5. The inferior vena cava is normal in size with greater than 50%  respiratory variability, suggesting right atrial pressure of 3 mmHg.   6. Challenging image quality.   Patient Profile     70 y.o. female w/PMHx of cardiac arrest,ICD, HTN, HLD, obesity, GERD admitted with symptoms of a chest "spasm", w/u found ICD macro lead dislodgement to the innominat vein, planned for ICD lead revision  Device information Abbott single change ICD implanted 03/09/21 Secondary prevention  Assessment & Plan    ICD lead dislodgement ?twiddler ICD system was programmed off and lanned for lead replacement/revision today with Dr. Lovena Le  Answered the patient's follow up questions, she remains  agreeable to the procedure  2. Hx of c.arrest 02/27/21, no recurrent syncope, no arrhythmias on device No driving 6mofrom event sate 3. HTN 4. HLD VSS Continue home meds  Anticipate discharge tomorrow given time of her procedure   For questions or updates, please contact [place] consult [email or web address] for contact info under     Signed, RBaldwin Jamaica PA-C  07/25/2021, 7:22 AM    EP Attending  Patient seen and examined. I discussed the indications/risks/benefits/goals/expectations of ICD lead revision and she is willing to proceed.  GCarleene OverlieTaylor,MD

## 2021-07-25 NOTE — Discharge Instructions (Addendum)
    Supplemental Discharge Instructions for  Pacemaker/Defibrillator Patients  Activity No heavy lifting or vigorous activity with your left arm for 6 to 8 weeks.  Do not raise your left arm above your head for one week.  Gradually raise your affected arm as drawn below.             07/30/21                    07/31/21                  08/01/21                   08/02/21 __  NO DRIVING as discussed, after the completion of your initial 6 month driving restriction  WOUND CARE Keep the wound area clean and dry.  Do not get this area wet , no showers for one week; you may shower on   07/25/21  . Tomorrow, 07/26/21, remove the arm sling Tomorrow, 07/26/21, remove the LARGE outer plastic dressing, underneath there are small pieces of tape (steri strips), DO NOT remove these The tape/steri-strips on your wound will fall off; do not pull them off.  No bandage is needed on the site.  DO  NOT apply any creams, oils, or ointments to the wound area. If you notice any drainage or discharge from the wound, any swelling or bruising at the site, or you develop a fever > 101? F after you are discharged home, call the office at once.  Special Instructions You are still able to use cellular telephones; use the ear opposite the side where you have your pacemaker/defibrillator.  Avoid carrying your cellular phone near your device. When traveling through airports, show security personnel your identification card to avoid being screened in the metal detectors.  Ask the security personnel to use the hand wand. Avoid arc welding equipment, MRI testing (magnetic resonance imaging), TENS units (transcutaneous nerve stimulators).  Call the office for questions about other devices. Avoid electrical appliances that are in poor condition or are not properly grounded. Microwave ovens are safe to be near or to operate.  Additional information for defibrillator patients should your device go off: If your device goes off ONCE  and you feel fine afterward, notify the device clinic nurses. If your device goes off ONCE and you do not feel well afterward, call 911. If your device goes off TWICE, call 911. If your device goes off THREE times in one day, call 911.  DO NOT DRIVE YOURSELF OR A FAMILY MEMBER WITH A DEFIBRILLATOR TO THE HOSPITAL--CALL 911.

## 2021-07-25 NOTE — Discharge Summary (Addendum)
ELECTROPHYSIOLOGY PROCEDURE DISCHARGE SUMMARY    Patient ID: Julie James,  MRN: [id number], DOB/AGE: 08/02/1951 70 y.o.  Admit date: 07/24/2021 Discharge date: 07/25/21  Primary Care Physician: Verl Bangs, FNP  Primary Cardiologist: Dr. Rockey Situ Electrophysiologist: Dr. Caryl Comes  Primary Discharge Diagnosis:  ICD lead dislodgement  Secondary Discharge Diagnosis:  Hx of cardiac arrest 02/27/21 HTN Obesity  Allergies  Allergen Reactions   Chlorthalidone Other (See Comments)    hypokalemia     Procedures This Admission:  07/25/21: ICD lead revision, Dr. Lovena Le Conclusion: Successful insertion of a new Saint Jude single-chamber ICD lead and removal of the previously implanted defibrillator lead which had dislodged and was residing in the left subclavian vein.  There were no immediate procedural complications.   Brief HPI: Julie James is a 70 y.o. female was admitted with a c/o feeling a spasm in her chest, w/u revealed that her ICD lead was dislodged and pulled back in to the innominat vein.  Hospital Course:  The patient was admitted and underwent removal of the ICD lead and implantation of a new lead.  I She was monitored on telemetry throughout her stay and demonstrated SR.  Left chest was without hematoma or ecchymosis.  Wound care, arm mobility, and restrictions were reviewed with the patient.  The patient feels well, She denies any CP or SOB, has minimal site discomfort, she was examined by Dr. Lovena Le and considered stable for discharge to home, once CXR and device check are completed and cleared.   The patient is scheduled for a CXR at 1700, her device will be checked by industry, these will be reviewed by Dr. Lovena Le, anticipate discharge to home tonight if both are found stable.   Physical Exam: Vitals:   07/25/21 0930 07/25/21 1112 07/25/21 1212 07/25/21 1512  BP: (!) 160/56  124/68 124/67  Pulse: 80  65 71  Resp:    16  Temp:    (!) 97.5 F  (36.4 C)  TempSrc:    Oral  SpO2:  100% 100% 100%  Weight:      Height:        GEN- The patient is well appearing, alert and oriented x 3 today.   HEENT: normocephalic, atraumatic; sclera clear, conjunctiva pink; hearing intact; oropharynx clear Lungs- CTA b/l, normal work of breathing.  No wheezes, rales, rhonchi Heart- RRR, no murmurs, rubs or gallops, PMI not laterally displaced GI- soft, non-tender, non-distended Extremities- no clubbing, cyanosis, or edema MS- no significant deformity or atrophy Skin- warm and dry, no rash or lesion, left chest without hematoma/ecchymosis Psych- euthymic mood, full affect Neuro- no gross defecits  Labs:   Lab Results  Component Value Date   WBC 7.8 07/23/2021   HGB 12.0 07/23/2021   HCT 37.1 07/23/2021   MCV 88.5 07/23/2021   PLT 373 07/23/2021    Recent Labs  Lab 07/23/21 1847  NA 139  K 3.7  CL 105  CO2 26  BUN 15  CREATININE 0.90  CALCIUM 9.4  PROT 8.7*  BILITOT 0.5  ALKPHOS 89  ALT 17  AST 23  GLUCOSE 106*    Discharge Medications:  Allergies as of 07/25/2021       Reactions   Chlorthalidone Other (See Comments)   hypokalemia        Medication List     TAKE these medications    amLODipine 5 MG tablet Commonly known as: NORVASC Take 1 tablet (5 mg total) by mouth daily.   Aspirin Low  Dose 81 MG EC tablet Generic drug: aspirin Take 1 tablet (81 mg total) by mouth daily. Swallow whole.   atorvastatin 20 MG tablet Commonly known as: LIPITOR Take 1 tablet (20 mg total) by mouth daily.   losartan 100 MG tablet Commonly known as: COZAAR Take 1 tablet (100 mg total) by mouth daily.   metoprolol succinate 50 MG 24 hr tablet Commonly known as: TOPROL-XL Take 1 tablet (50 mg total) by mouth daily. Take with or immediately following a meal.   pantoprazole 40 MG tablet Commonly known as: PROTONIX Take 1 tablet (40 mg total) by mouth at bedtime.   spironolactone 50 MG tablet Commonly known as:  ALDACTONE Take 1 tablet (50 mg total) by mouth daily.        Disposition: Home Discharge Instructions     Diet - low sodium heart healthy   Complete by: As directed    Increase activity slowly   Complete by: As directed        Follow-up Information     [facility] Follow up.   Specialty: Cardiology Why: 08/04/21 @ 9:20AM, wound check visit Contact information: [address], [address] [place]        Deboraha Sprang, MD Follow up.   Specialty: Cardiology Why: 11/01/21 @ 11:20AM Contact information: [place] [address] [phone]                 Duration of Discharge Encounter: Greater than 30 minutes including physician time.  [name], PA-C 07/25/2021 3:15 PM  EP Attending  Patient seen and examined. Agree with the findings as noted above. The patient is stable after ICD lead revision and will be discharged home with usual followup.   Carleene Overlie Sunita Demond,MD

## 2021-07-25 NOTE — Plan of Care (Signed)

## 2021-07-25 NOTE — H&P (View-Only) (Signed)
Progress Note  Patient Name: Julie James Date of Encounter: 07/25/2021  [facility] Cardiologist: Ida Rogue, MD  EP-Dr. Caryl Comes  Subjective   Doing OK, no complaints  Inpatient Medications    Scheduled Meds:  amLODipine  5 mg Oral Daily   aspirin EC  81 mg Oral Daily   atorvastatin  20 mg Oral Daily   gentamicin irrigation  80 mg Irrigation On Call   losartan  100 mg Oral Daily   metoprolol succinate  50 mg Oral Daily   pantoprazole  40 mg Oral QHS   spironolactone  50 mg Oral Daily   Continuous Infusions:  sodium chloride 50 mL/hr at 07/25/21 0636   sodium chloride 50 mL/hr at 07/25/21 [id number]    ceFAZolin (ANCEF) IV     PRN Meds:    Vital Signs    Vitals:   07/24/21 1712 07/24/21 1942 07/25/21 0004 07/25/21 0431  BP: 132/62 119/69 133/61 (!) 131/58  Pulse: 67 63 64 60  Resp: '16 18 17 16  '$ Temp: 98 F (36.7 C) 97.9 F (36.6 C) 97.9 F (36.6 C) 97.9 F (36.6 C)  TempSrc: Oral Oral Oral Oral  SpO2: 100% 100% 100% 100%  Weight:      Height:        Intake/Output Summary (Last 24 hours) at 07/25/2021 [id number] Last data filed at 07/24/2021 1958 Gross per 24 hour  Intake 120 ml  Output --  Net 120 ml   Last 3 Weights 07/24/2021 07/23/2021 06/21/2021  Weight (lbs) 248 lb 4.8 oz 250 lb 254 lb  Weight (kg) 112.628 kg 113.399 kg 115.214 kg      Telemetry    SR, occ OVCs, infrequent brief periods of V bigeminy, rare couplet - Personally Reviewed  ECG    No new EKGs - Personally Reviewed  Physical Exam   GEN: No acute distress.   Neck: No JVD Cardiac: RRR, no murmurs, rubs, or gallops.  Respiratory: CTA b/l. GI: Soft, nontender, non-distended  MS: No edema; No deformity. Neuro:  Nonfocal  Psych: Normal affect   Labs    High Sensitivity Troponin:   Recent Labs  Lab 07/23/21 1847  TROPONINIHS 7      Chemistry Recent Labs  Lab 07/23/21 1847  NA 139  K 3.7  CL 105  CO2 26  GLUCOSE 106*  BUN 15  CREATININE 0.90  CALCIUM 9.4  PROT  8.7*  ALBUMIN 4.4  AST 23  ALT 17  ALKPHOS 89  BILITOT 0.5  GFRNONAA >60  ANIONGAP 8     Hematology Recent Labs  Lab 07/23/21 1847  WBC 7.8  RBC 4.19  HGB 12.0  HCT 37.1  MCV 88.5  MCH 28.6  MCHC 32.3  RDW 14.1  PLT 373    BNP Recent Labs  Lab 07/23/21 1847  BNP 228.6*     DDimer No results for input(s): DDIMER in the last 168 hours.   Radiology    DG Chest Port 1 View  Result Date: 07/23/2021 CLINICAL DATA:  Chest pain, defibrillator firing intermittently for 1 week EXAM: PORTABLE CHEST 1 VIEW COMPARISON:  03/09/2021 FINDINGS: Single frontal view of the chest demonstrates interval change in positioning of the implanted defibrillator. Since the previous exam, the lead has retracted, now in the region of the superior vena cava, with coiling of the lead surrounding the generator, which has changed orientation within the left anterior chest wall. Findings are consistent with reel syndrome. The cardiac silhouette is stable. No airspace disease,  effusion, or pneumothorax. No acute bony abnormalities. IMPRESSION: 1. Change in positioning of the defibrillator generator within the left anterior chest wall, with coiling of the lead around the generator and retraction of the lead to the region of the superior vena cava. Findings are compatible with reel syndrome. 2. No acute airspace disease. Critical Value/emergent results were called by telephone at the time of interpretation on 07/23/2021 at 9:42 pm to provider [facility] , who verbally acknowledged these results. Electronically Signed   By: Randa Ngo M.D.   On: 07/23/2021 21:49    Cardiac Studies   03/07/21: c.MRI IMPRESSION: 1. Normal LV size with mild concentric LV hypertrophy. Septal-lateral dyssynchrony consistent with LBBB, also mild inferior and inferolateral hypokinesis. EF 46%.   2.  Normal RV size and systolic function, EF 99991111.   3. No myocardial LGE, so no definitive evidence for prior MI, infiltrative  disease, or myocarditis. Normal extracellular fluid volume percentage.  03/04/21: LHC The left ventricular systolic function is normal. LV end diastolic pressure is normal. The left ventricular ejection fraction is 55-65% by visual estimate.   1.  Normal coronary arteries. 2.  Normal LV systolic function and left ventricular end-diastolic pressure.  Wall motion could not be fully evaluated.   03/02/2021: TTE IMPRESSIONS   1. Left ventricular ejection fraction, by estimation, is 55 to 60%. The  left ventricle has normal function. The left ventricle has no regional  wall motion abnormalities. There is mild left ventricular hypertrophy.  Left ventricular diastolic parameters  are consistent with Grade I diastolic dysfunction (impaired relaxation).   2. Right ventricular systolic function is normal. The right ventricular  size is normal. Tricuspid regurgitation signal is inadequate for assessing  PA pressure.   3. The mitral valve is normal in structure. No evidence of mitral valve  regurgitation. No evidence of mitral stenosis.   4. The aortic valve is normal in structure. Aortic valve regurgitation is  not visualized. Mild aortic valve sclerosis is present, with no evidence  of aortic valve stenosis.   5. The inferior vena cava is normal in size with greater than 50%  respiratory variability, suggesting right atrial pressure of 3 mmHg.   6. Challenging image quality.   Patient Profile     70 y.o. female w/PMHx of cardiac arrest,ICD, HTN, HLD, obesity, GERD admitted with symptoms of a chest "spasm", w/u found ICD macro lead dislodgement to the innominat vein, planned for ICD lead revision  Device information Abbott single change ICD implanted 03/09/21 Secondary prevention  Assessment & Plan    ICD lead dislodgement ?twiddler ICD system was programmed off and lanned for lead replacement/revision today with Dr. Lovena Le  Answered the patient's follow up questions, she remains  agreeable to the procedure  2. Hx of c.arrest 02/27/21, no recurrent syncope, no arrhythmias on device No driving 39mofrom event sate 3. HTN 4. HLD VSS Continue home meds  Anticipate discharge tomorrow given time of her procedure   For questions or updates, please contact [place] consult [email or web address] for contact info under     Signed, RBaldwin Jamaica PA-C  07/25/2021, 7:22 AM    EP Attending  Patient seen and examined. I discussed the indications/risks/benefits/goals/expectations of ICD lead revision and she is willing to proceed.  GCarleene OverlieTaylor,MD

## 2021-07-25 NOTE — Interval H&P Note (Signed)
History and Physical Interval Note:  07/25/2021 10:52 AM  Julie James  has presented today for surgery, with the diagnosis of lead dislodged.  The various methods of treatment have been discussed with the patient and family. After consideration of risks, benefits and other options for treatment, the patient has consented to  Procedure(s): ICD LEAD REVISION/REPAIR (N/A) as a surgical intervention.  The patient's history has been reviewed, patient examined, no change in status, stable for surgery.  I have reviewed the patient's chart and labs.  Questions were answered to the patient's satisfaction.     Cristopher Peru

## 2021-07-26 MED FILL — Heparin Sod (Porcine)-NaCl IV Soln 1000 Unit/500ML-0.9%: INTRAVENOUS | Qty: 500 | Status: AC

## 2021-08-04 ENCOUNTER — Other Ambulatory Visit: Payer: Self-pay

## 2021-08-04 ENCOUNTER — Encounter: Payer: Self-pay | Admitting: Student

## 2021-08-04 ENCOUNTER — Ambulatory Visit (INDEPENDENT_AMBULATORY_CARE_PROVIDER_SITE_OTHER): Payer: Medicare Other | Admitting: Student

## 2021-08-04 VITALS — BP 146/76 | HR 80 | Ht 66.0 in | Wt 252.8 lb

## 2021-08-04 DIAGNOSIS — I469 Cardiac arrest, cause unspecified: Secondary | ICD-10-CM

## 2021-08-04 DIAGNOSIS — I493 Ventricular premature depolarization: Secondary | ICD-10-CM | POA: Diagnosis not present

## 2021-08-04 LAB — CUP PACEART INCLINIC DEVICE CHECK
Battery Remaining Longevity: 91 mo
Brady Statistic RV Percent Paced: 0.01 %
Date Time Interrogation Session: 20220901100730
HighPow Impedance: 58.5 Ohm
Implantable Lead Implant Date: 20220406
Implantable Lead Location: 753860
Implantable Lead Model: 7122
Implantable Pulse Generator Implant Date: 20220406
Lead Channel Impedance Value: 400 Ohm
Lead Channel Pacing Threshold Amplitude: 0.5 V
Lead Channel Pacing Threshold Amplitude: 0.5 V
Lead Channel Pacing Threshold Pulse Width: 0.5 ms
Lead Channel Pacing Threshold Pulse Width: 0.5 ms
Lead Channel Sensing Intrinsic Amplitude: 11.8 mV
Lead Channel Setting Pacing Amplitude: 3.5 V
Lead Channel Setting Pacing Pulse Width: 0.5 ms
Lead Channel Setting Sensing Sensitivity: 0.5 mV
Pulse Gen Serial Number: 9907684

## 2021-08-04 NOTE — Progress Notes (Signed)
Electrophysiology Office Note Date: 08/04/2021  ID:  Julie James, DOB [birth year], MRN [id number]  PCP: Verl Bangs, FNP Primary Cardiologist: Ida Rogue, MD Electrophysiologist: Virl Axe, MD   CC: Routine ICD follow-up  Julie James is a 70 y.o. female seen today for Virl Axe, MD for post hospital follow up s/p lead revision.  Since discharge from hospital the patient reports doing well.  she denies chest pain, palpitations, dyspnea, PND, orthopnea, nausea, vomiting, dizziness, syncope, edema, weight gain, or early satiety. She has not had ICD shocks.   Device History: St. Jude Single Chamber ICD implanted 03/2021, RV lead revision (dislodgement) 07/2021 for CHF  Past Medical History:  Diagnosis Date   Cardiac arrest ([place]) 02/27/2021   a.  Witnessed out of hospital cardiac arrest s/p shock x2; b.  LHC 03/04/2021 w/ normal coronary arteries; c. TTE 03/02/21 - EF 55-60%, no RWMA, mild LVH, Gr1DD, nl RVSF and ventricular cavity size, and mild aortic valve sclerosis w/o evidence of stenosis   Hypercholesteremia    Hypertension    Past Surgical History:  Procedure Laterality Date   ICD IMPLANT N/A 03/09/2021   Procedure: ICD IMPLANT;  Surgeon: Deboraha Sprang, MD;  Location: [place] [facility] [facility];  Service: Cardiovascular;  Laterality: N/A;   LEAD REVISION/REPAIR N/A 07/25/2021   Procedure: ICD LEAD REVISION/REPAIR;  Surgeon: Evans Lance, MD;  Location: [place] [facility];  Service: Cardiovascular;  Laterality: N/A;   LEFT HEART CATH AND CORONARY ANGIOGRAPHY N/A 03/04/2021   Procedure: LEFT HEART CATH AND CORONARY ANGIOGRAPHY;  Surgeon: Wellington Hampshire, MD;  Location: [facility];  Service: Cardiovascular;  Laterality: N/A;    Current Outpatient Medications  Medication Sig Dispense Refill   amLODipine (NORVASC) 5 MG tablet Take 1 tablet (5 mg total) by mouth daily. 180 tablet 3   aspirin 81 MG EC tablet Take 1 tablet (81 mg total) by mouth daily.  Swallow whole. 30 tablet 11   atorvastatin (LIPITOR) 20 MG tablet Take 1 tablet (20 mg total) by mouth daily. 30 tablet 5   losartan (COZAAR) 100 MG tablet Take 1 tablet (100 mg total) by mouth daily. 30 tablet 5   metoprolol succinate (TOPROL-XL) 50 MG 24 hr tablet Take 1 tablet (50 mg total) by mouth daily. Take with or immediately following a meal. 30 tablet 5   pantoprazole (PROTONIX) 40 MG tablet Take 1 tablet (40 mg total) by mouth at bedtime. 30 tablet 5   spironolactone (ALDACTONE) 50 MG tablet Take 1 tablet (50 mg total) by mouth daily. 30 tablet 5   No current facility-administered medications for this visit.    Allergies:   Chlorthalidone   Social History: Social History   Socioeconomic History   Marital status: Legally Separated    Spouse name: Teacher, music   Number of children: 2   Years of education: Not on file   Highest education level: Not on file  Occupational History   Not on file  Tobacco Use   Smoking status: Never   Smokeless tobacco: Never  Vaping Use   Vaping Use: Never used  Substance and Sexual Activity   Alcohol use: No   Drug use: No   Sexual activity: Not on file  Other Topics Concern   Not on file  Social History Narrative   Not on file   Social Determinants of Health   Financial Resource Strain: Not on file  Food Insecurity: Not on file  Transportation Needs: Not on file  Physical Activity: Not on file  Stress: Not on file  Social Connections: Not on file  Intimate Partner Violence: Not on file    Family History: Family History  Problem Relation Age of Onset   Cancer Sister        breast   Cancer Sister        breast    Review of Systems: All other systems reviewed and are otherwise negative except as noted above.   Physical Exam: There were no vitals filed for this visit.   GEN- The patient is well appearing, alert and oriented x 3 today.   HEENT: normocephalic, atraumatic; sclera clear, conjunctiva pink; hearing  intact; oropharynx clear; neck supple, no JVP Lymph- no cervical lymphadenopathy Lungs- Clear to ausculation bilaterally, normal work of breathing.  No wheezes, rales, rhonchi Heart- Regular rate and rhythm, no murmurs, rubs or gallops, PMI not laterally displaced GI- soft, non-tender, non-distended, bowel sounds present, no hepatosplenomegaly Extremities- no clubbing or cyanosis. No edema; DP/PT/radial pulses 2+ bilaterally MS- no significant deformity or atrophy Skin- warm and dry, no rash or lesion; ICD pocket well healed Psych- euthymic mood, full affect Neuro- strength and sensation are intact  ICD interrogation- reviewed in detail today,  See PACEART report  EKG:  EKG is not ordered today.  Recent Labs: 02/27/2021: TSH 3.918 06/21/2021: Magnesium 2.0 07/23/2021: ALT 17; B Natriuretic Peptide 228.6; BUN 15; Creatinine, Ser 0.90; Hemoglobin 12.0; Platelets 373; Potassium 3.7; Sodium 139   Wt Readings from Last 3 Encounters:  07/24/21 248 lb 4.8 oz (112.6 kg)  07/23/21 250 lb (113.4 kg)  06/21/21 254 lb (115.2 kg)     Other studies Reviewed: Additional studies/ records that were reviewed today include: Previous EP office notes. Implant Note.   Assessment and Plan:  1.  Chronic systolic dysfunction s/p St. Jude single chamber ICD  euvolemic today Stable on an appropriate medical regimen Normal ICD function See P[facility] Art report No changes today  2. Aborted Cardiac Arrest She understands she is still not recommended to drive until 6 months post her arrest in March.   Current medicines are reviewed at length with the patient today.   The patient does not have concerns regarding her medicines.  The following changes were made today:  none   Disposition:   Follow up with Dr. Caryl Comes  as scheduled s/p lead revision   Signed, Annamaria Helling  08/04/2021 8:23 AM  [place] [place] [address] [place] [address] [phone]  (office) [phone] (fax)

## 2021-09-07 ENCOUNTER — Ambulatory Visit (INDEPENDENT_AMBULATORY_CARE_PROVIDER_SITE_OTHER): Payer: Medicare Other

## 2021-09-07 DIAGNOSIS — I469 Cardiac arrest, cause unspecified: Secondary | ICD-10-CM

## 2021-09-08 LAB — CUP PACEART REMOTE DEVICE CHECK
Battery Remaining Longevity: 83 mo
Battery Remaining Percentage: 86 %
Battery Voltage: 3.07 V
Brady Statistic RV Percent Paced: 1 %
Date Time Interrogation Session: 20221006105226
HighPow Impedance: 68 Ohm
HighPow Impedance: 68 Ohm
Implantable Lead Implant Date: 20220406
Implantable Lead Location: 753860
Implantable Lead Model: 7122
Implantable Pulse Generator Implant Date: 20220406
Lead Channel Impedance Value: 450 Ohm
Lead Channel Pacing Threshold Amplitude: 0.5 V
Lead Channel Pacing Threshold Pulse Width: 0.5 ms
Lead Channel Sensing Intrinsic Amplitude: 11.8 mV
Lead Channel Setting Pacing Amplitude: 3.5 V
Lead Channel Setting Pacing Pulse Width: 0.5 ms
Lead Channel Setting Sensing Sensitivity: 0.5 mV
Pulse Gen Serial Number: 9907684

## 2021-09-15 NOTE — Progress Notes (Signed)
Remote ICD transmission.   

## 2021-09-22 ENCOUNTER — Other Ambulatory Visit: Payer: Self-pay | Admitting: Family

## 2021-09-22 DIAGNOSIS — E782 Mixed hyperlipidemia: Secondary | ICD-10-CM

## 2021-09-22 DIAGNOSIS — I10 Essential (primary) hypertension: Secondary | ICD-10-CM

## 2021-09-22 DIAGNOSIS — Z95810 Presence of automatic (implantable) cardiac defibrillator: Secondary | ICD-10-CM

## 2021-09-22 DIAGNOSIS — K219 Gastro-esophageal reflux disease without esophagitis: Secondary | ICD-10-CM

## 2021-09-22 NOTE — Telephone Encounter (Signed)
Rx request sent to pharmacy.  

## 2021-10-05 ENCOUNTER — Telehealth: Payer: Self-pay | Admitting: Family Medicine

## 2021-10-05 NOTE — Telephone Encounter (Signed)
Left message for patient to call back and schedule Medicare Annual Wellness Visit (AWV) to be done virtually or by telephone.  No hx of AWV eligible as of 04/04/19  Please schedule at anytime with [facility].      40 Minutes appointment   Any questions, please call me at [phone]

## 2021-10-17 DIAGNOSIS — Z23 Encounter for immunization: Secondary | ICD-10-CM | POA: Diagnosis not present

## 2021-11-01 ENCOUNTER — Other Ambulatory Visit: Payer: Self-pay

## 2021-11-01 ENCOUNTER — Encounter: Payer: Self-pay | Admitting: Internal Medicine

## 2021-11-01 ENCOUNTER — Ambulatory Visit (INDEPENDENT_AMBULATORY_CARE_PROVIDER_SITE_OTHER): Payer: Medicare Other | Admitting: Internal Medicine

## 2021-11-01 VITALS — BP 184/90 | HR 80 | Ht 66.0 in | Wt 256.0 lb

## 2021-11-01 DIAGNOSIS — I493 Ventricular premature depolarization: Secondary | ICD-10-CM | POA: Diagnosis not present

## 2021-11-01 DIAGNOSIS — I10 Essential (primary) hypertension: Secondary | ICD-10-CM

## 2021-11-01 DIAGNOSIS — I469 Cardiac arrest, cause unspecified: Secondary | ICD-10-CM

## 2021-11-01 DIAGNOSIS — I5020 Unspecified systolic (congestive) heart failure: Secondary | ICD-10-CM

## 2021-11-01 DIAGNOSIS — Z95810 Presence of automatic (implantable) cardiac defibrillator: Secondary | ICD-10-CM

## 2021-11-01 LAB — PACEMAKER DEVICE OBSERVATION

## 2021-11-01 MED ORDER — AMLODIPINE BESYLATE 10 MG PO TABS: 10.0000 mg | ORAL_TABLET | Freq: Every day | ORAL | 3 refills | Status: DC

## 2021-11-01 NOTE — Patient Instructions (Addendum)
Medication Instructions:  -Your physician has recommended you make the following change in your medication:   1) INCREASE norvasc (amlodipine) to 10 mg: - take 1 tablet by mouth once daily   *If you need a refill on your cardiac medications before your next appointment, please call your pharmacy*   Lab Work: - Your physician recommends that you have lab work today: BMP   If you have labs (blood work) drawn today and your tests are completely normal, you will receive your results only by: [place] (if you have [place]) OR A paper copy in the mail If you have any lab test that is abnormal or we need to change your treatment, we will call you to review the results.   Testing/Procedures: - You have been referred to : Dr. Skeet Latch- [facility]  [place] [address] [place], [place] [address] [phone]  Dr. Blenda Mounts office should contact you directly to schedule an appointment, but if you do not hear from them in the next 1-2 weeks to schedule, please call her office at [phone] to schedule.  Follow-Up: At [facility], you and your health needs are our priority.  As part of our continuing mission to provide you with exceptional heart care, we have created designated Provider Care Teams.  These Care Teams include your primary Cardiologist (physician) and Advanced Practice Providers (APPs -  Physician Assistants and Nurse Practitioners) who all work together to provide you with the care you need, when you need it.  We recommend signing up for the patient portal called "MyChart".  Sign up information is provided on this After Visit Summary.  MyChart is used to connect with patients for Virtual Visits (Telemedicine).  Patients are able to view lab/test results, encounter notes, upcoming appointments, etc.  Non-urgent messages can be sent to your provider as well.   To learn more about what you can do with MyChart, go to  [email or web address].    Your next appointment:   8 month(s)  The format for your next appointment:   In Person  Provider:   Virl Axe, MD    Other Instructions N/a

## 2021-11-01 NOTE — Progress Notes (Signed)
Patient ID: Julie James, female   DOB: [birth year], 70 y.o.   MRN: [id number]      Patient Care Team: Verl Bangs, FNP as PCP - General (Family Medicine) Minna Merritts, MD as PCP - Cardiology (Cardiology) Deboraha Sprang, MD as PCP - Electrophysiology (Cardiology) Christene Lye, MD (General Surgery)     Keisi Eckford is a 70 y.o. female seen in follow-up for an aborted cardiac arrest for which she received a [employer] ICD 4/22 with late ICD lead dislodgment requiring surgical repositioning 8/22 (GT) The patient denies chest pain, nocturnal dyspnea, orthopnea or peripheral edema.  There have been no palpitations, lightheadedness or syncope.  Complains of mild chronic dyspnea.  Some discomfort at the device site some sensations of movement of the device.      Daughter passed away from Lupus 5 years ago  DATE TEST EF%   3/22 Echo  55-60 %   4/22 LHC  55-65 %   4/22 MRI 46$ LGE neg    Date   Cr              K Hgb   4/21 0.79 4.5 11.1   3/22 0.94 4.1 11.3   4/22 0.90 4.5  12.4   8/22 0.9 3.7 12.0    Records and Results Reviewed   Past Medical History:  Diagnosis Date   Cardiac arrest ([place]) 02/27/2021   a.  Witnessed out of hospital cardiac arrest s/p shock x2; b.  LHC 03/04/2021 w/ normal coronary arteries; c. TTE 03/02/21 - EF 55-60%, no RWMA, mild LVH, Gr1DD, nl RVSF and ventricular cavity size, and mild aortic valve sclerosis w/o evidence of stenosis   Hypercholesteremia    Hypertension     Past Surgical History:  Procedure Laterality Date   ICD IMPLANT N/A 03/09/2021   Procedure: ICD IMPLANT;  Surgeon: Deboraha Sprang, MD;  Location: [facility];  Service: Cardiovascular;  Laterality: N/A;   LEAD REVISION/REPAIR N/A 07/25/2021   Procedure: ICD LEAD REVISION/REPAIR;  Surgeon: Evans Lance, MD;  Location: [facility];  Service: Cardiovascular;  Laterality: N/A;   LEFT HEART CATH AND CORONARY ANGIOGRAPHY N/A 03/04/2021   Procedure: LEFT  HEART CATH AND CORONARY ANGIOGRAPHY;  Surgeon: Wellington Hampshire, MD;  Location: [facility];  Service: Cardiovascular;  Laterality: N/A;    Current Meds  Medication Sig   amLODipine (NORVASC) 5 MG tablet Take 1 tablet (5 mg total) by mouth daily.   aspirin 81 MG EC tablet Take 1 tablet (81 mg total) by mouth daily. Swallow whole.   atorvastatin (LIPITOR) 20 MG tablet Take 1 tablet (20 mg total) by mouth daily.   losartan (COZAAR) 100 MG tablet Take 1 tablet (100 mg total) by mouth daily.   metoprolol succinate (TOPROL-XL) 50 MG 24 hr tablet Take 1 tablet (50 mg total) by mouth daily. Take with or immediately following a meal.   pantoprazole (PROTONIX) 40 MG tablet Take 1 tablet (40 mg total) by mouth at bedtime.   spironolactone (ALDACTONE) 50 MG tablet Take 1 tablet (50 mg total) by mouth daily.    Allergies  Allergen Reactions   Chlorthalidone Other (See Comments)    hypokalemia    Review of Systems negative except from HPI and PMH  Physical Exam: BP (!) 184/90 (BP Location: Left Arm, Patient Position: Sitting, Cuff Size: Normal)   Pulse 80   Ht 5\' 6"  (1.676 m)   Wt 256 lb (116.1 kg)  SpO2 98%   BMI 41.32 kg/m  Well developed and well nourished in no acute distress HENT normal Neck supple with JVP-flat Clear Device pocket well healed; without hematoma or erythema.  There is no tethering  Regular rate and rhythm, no  gallop No  / murmur Abd-soft with active BS No Clubbing cyanosis   edema Skin-warm and dry A & Oriented  Grossly normal sensory and motor function  ECG sinus A @ 80    Assessment and  Plan:  Aborted cardiac arrest  Nonischemic cardiomyopathy-mild  PVCs right bundle-dimorphic with superior axis and inferior axis  Left bundle branch block  Hypertension  HFpEF/dyspnea on exertion  ICD-[employer] Jude  No interval arrhythmias.  Status post revision following macro lead dislodgment late post ICD implantation  Denies dyspnea on exertion;  euvolemic  Blood pressure remains poorly controlled.  She is not sure if she snores; her daughter with whom she lives snores loudly.  We will benefit from a sleep study.  Given the fact that she is on 4 medications with which she says she is compliant will refer to Dr. Phillips Hay for further evaluation--continue amlodipine 5 we will increase it to 10, Aldactone 50, metoprolol 50 and losartan 100.  Check BMET on aldactone

## 2021-11-02 LAB — BASIC METABOLIC PANEL
BUN/Creatinine Ratio: 15 (ref 12–28)
BUN: 12 mg/dL (ref 8–27)
CO2: 21 mmol/L (ref 20–29)
Calcium: 9.4 mg/dL (ref 8.7–10.3)
Chloride: 104 mmol/L (ref 96–106)
Creatinine, Ser: 0.81 mg/dL (ref 0.57–1.00)
Glucose: 90 mg/dL (ref 70–99)
Potassium: 4 mmol/L (ref 3.5–5.2)
Sodium: 140 mmol/L (ref 134–144)
eGFR: 79 mL/min/{1.73_m2} (ref >59)

## 2021-11-03 ENCOUNTER — Telehealth: Payer: Self-pay | Admitting: Internal Medicine

## 2021-11-03 NOTE — Telephone Encounter (Signed)
Attempted to call the patient. No answer- I left a message to please call back (no DPR on file).   

## 2021-11-03 NOTE — Telephone Encounter (Signed)
Julie Sprang, MD  11/03/2021  9:48 AM EST     Please Inform Patient that labs are normal  Thanks

## 2021-11-07 NOTE — Telephone Encounter (Signed)
Attempted to call the patient. No answer- I left a message to please call back.  No DPR on file.

## 2021-11-08 ENCOUNTER — Encounter: Payer: Self-pay | Admitting: *Deleted

## 2021-11-08 NOTE — Telephone Encounter (Signed)
3rd attempt to contact the patient. No answer- I left a message I will be mailing these results and she may call back with any questions.   Letter of results mailed.

## 2021-12-07 ENCOUNTER — Ambulatory Visit (INDEPENDENT_AMBULATORY_CARE_PROVIDER_SITE_OTHER): Payer: Medicare HMO

## 2021-12-07 DIAGNOSIS — I469 Cardiac arrest, cause unspecified: Secondary | ICD-10-CM

## 2021-12-08 LAB — CUP PACEART REMOTE DEVICE CHECK
Battery Remaining Longevity: 86 mo
Battery Remaining Percentage: 84 %
Battery Voltage: 3.02 V
Brady Statistic RV Percent Paced: 1 %
Date Time Interrogation Session: 20230105143450
HighPow Impedance: 77 Ohm
HighPow Impedance: 77 Ohm
Implantable Lead Implant Date: 20220406
Implantable Lead Location: 753860
Implantable Lead Model: 7122
Implantable Pulse Generator Implant Date: 20220406
Lead Channel Impedance Value: 480 Ohm
Lead Channel Pacing Threshold Amplitude: 0.75 V
Lead Channel Pacing Threshold Pulse Width: 0.5 ms
Lead Channel Sensing Intrinsic Amplitude: 11.8 mV
Lead Channel Setting Pacing Amplitude: 2.5 V
Lead Channel Setting Pacing Pulse Width: 0.5 ms
Lead Channel Setting Sensing Sensitivity: 0.5 mV
Pulse Gen Serial Number: 9907684

## 2021-12-12 ENCOUNTER — Other Ambulatory Visit: Payer: Self-pay | Admitting: Family

## 2021-12-12 DIAGNOSIS — I10 Essential (primary) hypertension: Secondary | ICD-10-CM

## 2021-12-19 NOTE — Progress Notes (Signed)
Remote ICD transmission.   

## 2022-01-03 ENCOUNTER — Encounter: Payer: Self-pay | Admitting: Cardiovascular Disease

## 2022-01-05 ENCOUNTER — Telehealth: Payer: Self-pay

## 2022-01-05 NOTE — Telephone Encounter (Signed)
Attempted to contact the patient, no answer. Left message for patient to call back and schedule her Medicare Annual Wellness Visit (AWV) virtually, telephone or face to face.

## 2022-01-31 ENCOUNTER — Telehealth: Payer: Self-pay

## 2022-01-31 NOTE — Telephone Encounter (Signed)
Left message for patient to call back and schedule the Medicare Annual Wellness Visit (AWV) virtually, telephone or face to face.   Keenan Bachelor, [place] [phone]

## 2022-03-08 ENCOUNTER — Ambulatory Visit (INDEPENDENT_AMBULATORY_CARE_PROVIDER_SITE_OTHER): Payer: Medicare HMO

## 2022-03-08 DIAGNOSIS — I469 Cardiac arrest, cause unspecified: Secondary | ICD-10-CM

## 2022-03-09 LAB — CUP PACEART REMOTE DEVICE CHECK
Battery Remaining Longevity: 85 mo
Battery Remaining Percentage: 83 %
Battery Voltage: 3.01 V
Brady Statistic RV Percent Paced: 1 %
Date Time Interrogation Session: 20230405083558
HighPow Impedance: 68 Ohm
HighPow Impedance: 68 Ohm
Implantable Lead Implant Date: 20220406
Implantable Lead Location: 753860
Implantable Lead Model: 7122
Implantable Pulse Generator Implant Date: 20220406
Lead Channel Impedance Value: 450 Ohm
Lead Channel Pacing Threshold Amplitude: 0.75 V
Lead Channel Pacing Threshold Pulse Width: 0.5 ms
Lead Channel Sensing Intrinsic Amplitude: 11.8 mV
Lead Channel Setting Pacing Amplitude: 2.5 V
Lead Channel Setting Pacing Pulse Width: 0.5 ms
Lead Channel Setting Sensing Sensitivity: 0.5 mV
Pulse Gen Serial Number: 9907684

## 2022-03-14 ENCOUNTER — Telehealth: Payer: Self-pay | Admitting: Family Medicine

## 2022-03-14 NOTE — Telephone Encounter (Signed)
N/A unable to leave a message for patient to call back and schedule Medicare Annual Wellness Visit (AWV) to be done virtually or by telephone. ? ?No hx of AWV eligible as of 04/04/19 ? ?Please schedule at anytime with [facility].     ? ? ?Any questions, please call me at [phone]  ?

## 2022-03-23 NOTE — Progress Notes (Signed)
Remote ICD transmission.   

## 2022-03-27 ENCOUNTER — Other Ambulatory Visit: Payer: Self-pay | Admitting: Internal Medicine

## 2022-05-04 ENCOUNTER — Other Ambulatory Visit: Payer: Self-pay | Admitting: Cardiovascular Disease

## 2022-05-04 DIAGNOSIS — Z95810 Presence of automatic (implantable) cardiac defibrillator: Secondary | ICD-10-CM

## 2022-05-04 DIAGNOSIS — K219 Gastro-esophageal reflux disease without esophagitis: Secondary | ICD-10-CM

## 2022-05-04 DIAGNOSIS — E782 Mixed hyperlipidemia: Secondary | ICD-10-CM

## 2022-05-04 DIAGNOSIS — I10 Essential (primary) hypertension: Secondary | ICD-10-CM

## 2022-05-05 NOTE — Telephone Encounter (Signed)
Attempted to schedule.  LMOV to call office.  ° °

## 2022-05-05 NOTE — Telephone Encounter (Signed)
Please contact pt for future appointment. Pt due for 12 month f/u. 

## 2022-05-12 ENCOUNTER — Telehealth: Payer: Self-pay | Admitting: Cardiovascular Disease

## 2022-05-12 NOTE — Telephone Encounter (Signed)
*  STAT* If patient is at the pharmacy, call can be transferred to refill team.   1. Which medications need to be refilled? (please list name of each medication and dose if known)   spironolactone (ALDACTONE) 50 MG tablet atorvastatin (LIPITOR) 20 MG tablet pantoprazole (PROTONIX) 40 MG tablet losartan (COZAAR) 100 MG tablet  2. Which pharmacy/location (including street and city if local pharmacy) is medication to be sent to? [place], [place] - [address]  3. Do they need a 30 day or 90 day supply?  90 day supply  Melanie with [facility] is following up regarding refill request sent in earlier today. She states the pharmacy is in the pharmacy now and hopeful to have these medications filled before the weekend if possible.

## 2022-05-12 NOTE — Telephone Encounter (Signed)
Refills sent to pharmacy as requested.

## 2022-05-12 NOTE — Telephone Encounter (Signed)
Scheduled

## 2022-06-07 ENCOUNTER — Ambulatory Visit (INDEPENDENT_AMBULATORY_CARE_PROVIDER_SITE_OTHER): Payer: Medicare HMO

## 2022-06-07 DIAGNOSIS — I469 Cardiac arrest, cause unspecified: Secondary | ICD-10-CM | POA: Diagnosis not present

## 2022-06-10 LAB — CUP PACEART REMOTE DEVICE CHECK
Battery Remaining Longevity: 83 mo
Battery Remaining Percentage: 81 %
Battery Voltage: 2.99 V
Brady Statistic RV Percent Paced: 1 %
Date Time Interrogation Session: 20230705020020
HighPow Impedance: 66 Ohm
HighPow Impedance: 66 Ohm
Implantable Lead Implant Date: 20220406
Implantable Lead Location: 753860
Implantable Lead Model: 7122
Implantable Pulse Generator Implant Date: 20220406
Lead Channel Impedance Value: 450 Ohm
Lead Channel Pacing Threshold Amplitude: 0.75 V
Lead Channel Pacing Threshold Pulse Width: 0.5 ms
Lead Channel Sensing Intrinsic Amplitude: 11.8 mV
Lead Channel Setting Pacing Amplitude: 2.5 V
Lead Channel Setting Pacing Pulse Width: 0.5 ms
Lead Channel Setting Sensing Sensitivity: 0.5 mV
Pulse Gen Serial Number: 9907684

## 2022-06-13 ENCOUNTER — Encounter: Payer: Self-pay | Admitting: Internal Medicine

## 2022-06-23 ENCOUNTER — Other Ambulatory Visit: Payer: Self-pay | Admitting: Internal Medicine

## 2022-06-29 NOTE — Progress Notes (Signed)
Remote ICD transmission.   

## 2022-08-07 NOTE — Progress Notes (Signed)
Cardiology Office Note  Date:  08/08/2022   ID:  Julie James, DOB 11/12/1951, MRN [id number]  PCP:  Verl Bangs, FNP   Chief Complaint  Patient presents with   12 month follow up     "Doing well." Medications reviewed by the patient verbally.     HPI:  Julie James is a 72 y.o. female was admitted February 27, 2021 for cardiac arrest  Had CPR, shock x2, Normal ejection fraction on echocardiogram Full recovery,  [employer] Jude ICD 4/22 with late ICD lead dislodgment requiring surgical repositioning 8/22 (GT) presents for follow-up of her cardiac disease, history of cardiac arrest  Last seen by myself in clinic June 2022 Followed by device clinic Was referred for sleep study  In general she reports that she is doing well Not been monitoring blood pressure closely at home Initial numbers elevated, improved on repeat check Needs refills Denies significant shortness of breath, no chest pain, no angina symptoms No ED orthopnea, no tachypalpitations no near-syncope or syncope  Prior imaging studies reviewed Echo 3/22: normal ef 55%  Cardiac MRI 4/22, reviewed 1. Normal LV size with mild concentric LV hypertrophy. Septal-lateral dyssynchrony consistent with LBBB, also mild inferior and inferolateral hypokinesis. EF 46%. 2.  Normal RV size and systolic function, EF 99%. 3. No myocardial LGE, so no definitive evidence for prior MI, infiltrative disease, or myocarditis. Normal extracellular fluid volume percentage.  EKG personally reviewed by myself on todays visit Normal sinus rhythm with left bundle branch block rate 76 bpm, unchanged from November 2022  Prior events reviewed 02/27/21, with witnessed and aborted cardiac arrest s/p immediate CPR.  911 was called and on fire/rescue arrival, patient was reportedly in a shockable rhythm.  She received 2 shocks.  On EMS arrival, she had a pulse and was in what appeared to be a normal sinus rhythm.    Code STEMI was  activated due to concern for anterior precordial lead elevations.  En route, she was minimally responsive.  She arrived to the ED with pulses, unresponsive.  Code STEMI was cancelled by Dr. Lance Sell.  She was intubated in the emergency room due to being unresponsive.  In the ED, she developed WCT and was started on amiodarone which was subsequently stopped due to bradycardia.   HS-Tn initially 13 with a delta of 105, ultimately peaking at 502 and down trending.   Echo on 03/02/2021 showed an EF of 55-60%, no RWMA, mild LVH, Gr1DD, normal RVSF and ventricular cavity size, and mild aortic valve sclerosis without evidence of stenosis.   MRI brain showed no acute intracranial process. She was treated with IV heparin for 48 hours. Initial potassium was low at 2.7, which was repleted. Magnesium 2.0.  As outlined above, there were initial concerns for anoxic brain injury, though she has subsequently had return of near normal cognitive function.  Echo with normal EF. LHC with normal LVEDP and normal coronaries. cMRI with EF 46% and no LGE.    underwent implantation of a St. Jude single chamber ICD    PMH:   has a past medical history of Cardiac arrest ([place]) (02/27/2021), Hypercholesteremia, and Hypertension.  PSH:    Past Surgical History:  Procedure Laterality Date   ICD IMPLANT N/A 03/09/2021   Procedure: ICD IMPLANT;  Surgeon: Deboraha Sprang, MD;  Location: [facility] [place] [facility];  Service: Cardiovascular;  Laterality: N/A;   LEAD REVISION/REPAIR N/A 07/25/2021   Procedure: ICD LEAD REVISION/REPAIR;  Surgeon: Evans Lance, MD;  Location: [facility]  INVASIVE [facility];  Service: Cardiovascular;  Laterality: N/A;   LEFT HEART CATH AND CORONARY ANGIOGRAPHY N/A 03/04/2021   Procedure: LEFT HEART CATH AND CORONARY ANGIOGRAPHY;  Surgeon: Wellington Hampshire, MD;  Location: [facility] [place] [facility];  Service: Cardiovascular;  Laterality: N/A;    Current Outpatient Medications  Medication Sig Dispense Refill   aspirin 81 MG  EC tablet Take 1 tablet (81 mg total) by mouth daily. Swallow whole. 30 tablet 11   pantoprazole (PROTONIX) 40 MG tablet Take 1 tablet (40 mg total) by mouth at bedtime. 90 tablet 0   amLODipine (NORVASC) 10 MG tablet Take 1 tablet (10 mg total) by mouth daily. 90 tablet 3   atorvastatin (LIPITOR) 20 MG tablet Take 1 tablet (20 mg total) by mouth daily. 90 tablet 0   losartan (COZAAR) 100 MG tablet Take 1 tablet (100 mg total) by mouth daily. 90 tablet 3   metoprolol succinate (TOPROL-XL) 50 MG 24 hr tablet Take 1 tablet (50 mg total) by mouth daily. Take with or immediately following a meal. 90 tablet 3   spironolactone (ALDACTONE) 50 MG tablet Take 1 tablet (50 mg total) by mouth daily. 90 tablet 3   No current facility-administered medications for this visit.    Allergies:   Chlorthalidone   Social History:  The patient  reports that she has never smoked. She has never used smokeless tobacco. She reports that she does not drink alcohol and does not use drugs.   Family History:   family history includes Cancer in her sister and sister.    Review of Systems: Review of Systems  Constitutional: Negative.   HENT: Negative.    Respiratory: Negative.    Cardiovascular: Negative.   Gastrointestinal: Negative.   Musculoskeletal: Negative.   Neurological: Negative.   Psychiatric/Behavioral: Negative.    All other systems reviewed and are negative.   PHYSICAL EXAM: VS:  BP (!) 140/80 (BP Location: Left Arm)   Pulse 76   Ht '5\' 6"'$  (1.676 m)   Wt 251 lb (113.9 kg)   SpO2 99%   BMI 40.51 kg/m  , BMI Body mass index is 40.51 kg/m. Constitutional:  oriented to person, place, and time. No distress.  Obese HENT:  Head: Grossly normal Eyes:  no discharge. No scleral icterus.  Neck: No JVD, no carotid bruits  Cardiovascular: Regular rate and rhythm, no murmurs appreciated Pulmonary/Chest: Clear to auscultation bilaterally, no wheezes or rails Abdominal: Soft.  no distension.  no  tenderness.  Musculoskeletal: Normal range of motion Neurological:  normal muscle tone. Coordination normal. No atrophy Skin: Skin warm and dry Psychiatric: normal affect, pleasant  Recent Labs: 11/01/2021: BUN 12; Creatinine, Ser 0.81; Potassium 4.0; Sodium 140    Lipid Panel Lab Results  Component Value Date   CHOL 179 02/22/2021   HDL 51 02/22/2021   LDLCALC 106 (H) 02/22/2021   TRIG 88 02/28/2021      Wt Readings from Last 3 Encounters:  08/08/22 251 lb (113.9 kg)  11/01/21 256 lb (116.1 kg)  08/04/21 252 lb 12.8 oz (114.7 kg)      ASSESSMENT AND PLAN:  Problem List Items Addressed This Visit       Cardiology Problems   Hypertension   Relevant Medications   amLODipine (NORVASC) 10 MG tablet   atorvastatin (LIPITOR) 20 MG tablet   losartan (COZAAR) 100 MG tablet   metoprolol succinate (TOPROL-XL) 50 MG 24 hr tablet   spironolactone (ALDACTONE) 50 MG tablet   Other Relevant Orders  EKG 12-Lead   Hyperlipidemia   Relevant Medications   amLODipine (NORVASC) 10 MG tablet   atorvastatin (LIPITOR) 20 MG tablet   losartan (COZAAR) 100 MG tablet   metoprolol succinate (TOPROL-XL) 50 MG 24 hr tablet   spironolactone (ALDACTONE) 50 MG tablet   Cardiac arrest (HCC) - Primary   Relevant Medications   amLODipine (NORVASC) 10 MG tablet   atorvastatin (LIPITOR) 20 MG tablet   losartan (COZAAR) 100 MG tablet   metoprolol succinate (TOPROL-XL) 50 MG 24 hr tablet   spironolactone (ALDACTONE) 50 MG tablet   Other Relevant Orders   EKG 12-Lead   Other Visit Diagnoses     HFrEF (heart failure with reduced ejection fraction) (HCC)       Relevant Medications   amLODipine (NORVASC) 10 MG tablet   atorvastatin (LIPITOR) 20 MG tablet   losartan (COZAAR) 100 MG tablet   metoprolol succinate (TOPROL-XL) 50 MG 24 hr tablet   spironolactone (ALDACTONE) 50 MG tablet   Other Relevant Orders   EKG 12-Lead   ICD (implantable cardioverter-defibrillator) in place       Relevant  Medications   spironolactone (ALDACTONE) 50 MG tablet   Other Relevant Orders   EKG 12-Lead   PVCs (premature ventricular contractions)       Relevant Medications   amLODipine (NORVASC) 10 MG tablet   atorvastatin (LIPITOR) 20 MG tablet   losartan (COZAAR) 100 MG tablet   metoprolol succinate (TOPROL-XL) 50 MG 24 hr tablet   spironolactone (ALDACTONE) 50 MG tablet   Prediabetes       Essential hypertension       Relevant Medications   amLODipine (NORVASC) 10 MG tablet   atorvastatin (LIPITOR) 20 MG tablet   losartan (COZAAR) 100 MG tablet   metoprolol succinate (TOPROL-XL) 50 MG 24 hr tablet   spironolactone (ALDACTONE) 50 MG tablet     Cardiac arrest Nonischemic, no coronary disease by catheterization Normal ejection fraction, cardiac MRI reviewed Followed by Dr. Caryl Comes for ICD  Essential hypertension Initial blood pressure elevated, moderately improved on recheck No medication changes made but we have recommended she closely monitor blood pressure at home write these numbers down and bring them in for her next clinic visit with Dr. Caryl Comes  Morbid obesity We have encouraged continued exercise, careful diet management in an effort to lose weight.  ICD in place Followed by Dr. Caryl Comes At times bothered by the location of the device    Total encounter time more than 30 minutes  Greater than 50% was spent in counseling and coordination of care with the patient    Signed, Esmond Plants, M.D., Ph.D. [place], [place]

## 2022-08-08 ENCOUNTER — Encounter: Payer: Self-pay | Admitting: Cardiovascular Disease

## 2022-08-08 ENCOUNTER — Ambulatory Visit: Payer: Medicare HMO | Attending: Cardiovascular Disease | Admitting: Cardiovascular Disease

## 2022-08-08 VITALS — BP 140/80 | HR 76 | Ht 66.0 in | Wt 251.0 lb

## 2022-08-08 DIAGNOSIS — I469 Cardiac arrest, cause unspecified: Secondary | ICD-10-CM | POA: Diagnosis not present

## 2022-08-08 DIAGNOSIS — I493 Ventricular premature depolarization: Secondary | ICD-10-CM

## 2022-08-08 DIAGNOSIS — Z95810 Presence of automatic (implantable) cardiac defibrillator: Secondary | ICD-10-CM

## 2022-08-08 DIAGNOSIS — I5020 Unspecified systolic (congestive) heart failure: Secondary | ICD-10-CM

## 2022-08-08 DIAGNOSIS — I10 Essential (primary) hypertension: Secondary | ICD-10-CM

## 2022-08-08 DIAGNOSIS — R7303 Prediabetes: Secondary | ICD-10-CM

## 2022-08-08 DIAGNOSIS — E782 Mixed hyperlipidemia: Secondary | ICD-10-CM

## 2022-08-08 MED ORDER — SPIRONOLACTONE 50 MG PO TABS: 50.0000 mg | ORAL_TABLET | Freq: Every day | ORAL | 3 refills | Status: DC

## 2022-08-08 MED ORDER — METOPROLOL SUCCINATE ER 50 MG PO TB24: 50.0000 mg | ORAL_TABLET | Freq: Every day | ORAL | 3 refills | Status: DC

## 2022-08-08 MED ORDER — AMLODIPINE BESYLATE 10 MG PO TABS: 10.0000 mg | ORAL_TABLET | Freq: Every day | ORAL | 3 refills | Status: DC

## 2022-08-08 MED ORDER — LOSARTAN POTASSIUM 100 MG PO TABS: 100.0000 mg | ORAL_TABLET | Freq: Every day | ORAL | 3 refills | Status: DC

## 2022-08-08 MED ORDER — ATORVASTATIN CALCIUM 20 MG PO TABS: 20.0000 mg | ORAL_TABLET | Freq: Every day | ORAL | 0 refills | Status: DC

## 2022-08-08 NOTE — Patient Instructions (Addendum)
Medication Instructions:  No changes  If you need a refill on your cardiac medications before your next appointment, please call your pharmacy.   Please check your blood pressure at home (check 2 hours after morning medication) and keep a log. Bring your BP log with you to your next appointment.   Lab work: No new labs needed  Testing/Procedures: No new testing needed  Follow-Up: At [facility], you and your health needs are our priority.  As part of our continuing mission to provide you with exceptional heart care, we have created designated Provider Care Teams.  These Care Teams include your primary Cardiologist (physician) and Advanced Practice Providers (APPs -  Physician Assistants and Nurse Practitioners) who all work together to provide you with the care you need, when you need it.  You will need a follow up appointment in 12 months  Providers on your designated Care Team:   Murray Hodgkins, NP Christell Faith, PA-C Cadence Kathlen Mody, [place]  COVID-19 Vaccine Information can be found at: [email or web address].uk For questions related to vaccine distribution or appointments, please email [email or web address] or call [phone].

## 2022-08-11 ENCOUNTER — Other Ambulatory Visit: Payer: Self-pay | Admitting: Cardiovascular Disease

## 2022-08-11 DIAGNOSIS — K219 Gastro-esophageal reflux disease without esophagitis: Secondary | ICD-10-CM

## 2022-08-14 ENCOUNTER — Other Ambulatory Visit: Payer: Self-pay

## 2022-08-14 DIAGNOSIS — K219 Gastro-esophageal reflux disease without esophagitis: Secondary | ICD-10-CM

## 2022-08-14 MED ORDER — PANTOPRAZOLE SODIUM 40 MG PO TBEC: 40.0000 mg | DELAYED_RELEASE_TABLET | Freq: Every day | ORAL | 0 refills | Status: DC

## 2022-09-06 ENCOUNTER — Ambulatory Visit (INDEPENDENT_AMBULATORY_CARE_PROVIDER_SITE_OTHER): Payer: Medicare HMO

## 2022-09-06 DIAGNOSIS — I469 Cardiac arrest, cause unspecified: Secondary | ICD-10-CM

## 2022-09-06 LAB — CUP PACEART REMOTE DEVICE CHECK
Battery Remaining Longevity: 82 mo
Battery Remaining Percentage: 79 %
Battery Voltage: 2.99 V
Brady Statistic RV Percent Paced: 1 %
Date Time Interrogation Session: 20231004020014
HighPow Impedance: 68 Ohm
HighPow Impedance: 68 Ohm
Implantable Lead Implant Date: 20220406
Implantable Lead Location: 753860
Implantable Lead Model: 7122
Implantable Pulse Generator Implant Date: 20220406
Lead Channel Impedance Value: 450 Ohm
Lead Channel Pacing Threshold Amplitude: 0.75 V
Lead Channel Pacing Threshold Pulse Width: 0.5 ms
Lead Channel Sensing Intrinsic Amplitude: 11.8 mV
Lead Channel Setting Pacing Amplitude: 2.5 V
Lead Channel Setting Pacing Pulse Width: 0.5 ms
Lead Channel Setting Sensing Sensitivity: 0.5 mV
Pulse Gen Serial Number: 9907684

## 2022-09-15 NOTE — Progress Notes (Signed)
Remote ICD transmission.   

## 2022-09-26 ENCOUNTER — Other Ambulatory Visit: Payer: Self-pay | Admitting: *Deleted

## 2022-09-26 ENCOUNTER — Encounter: Payer: Self-pay | Admitting: Internal Medicine

## 2022-09-26 ENCOUNTER — Ambulatory Visit: Payer: Medicare HMO | Attending: Internal Medicine | Admitting: Internal Medicine

## 2022-09-26 VITALS — BP 146/88 | HR 78 | Ht 66.0 in | Wt 250.6 lb

## 2022-09-26 DIAGNOSIS — I5020 Unspecified systolic (congestive) heart failure: Secondary | ICD-10-CM | POA: Diagnosis not present

## 2022-09-26 DIAGNOSIS — K219 Gastro-esophageal reflux disease without esophagitis: Secondary | ICD-10-CM

## 2022-09-26 DIAGNOSIS — Z95810 Presence of automatic (implantable) cardiac defibrillator: Secondary | ICD-10-CM

## 2022-09-26 DIAGNOSIS — I469 Cardiac arrest, cause unspecified: Secondary | ICD-10-CM | POA: Diagnosis not present

## 2022-09-26 DIAGNOSIS — I493 Ventricular premature depolarization: Secondary | ICD-10-CM | POA: Diagnosis not present

## 2022-09-26 DIAGNOSIS — Z79899 Other long term (current) drug therapy: Secondary | ICD-10-CM

## 2022-09-26 DIAGNOSIS — E782 Mixed hyperlipidemia: Secondary | ICD-10-CM

## 2022-09-26 LAB — CUP PACEART INCLINIC DEVICE CHECK
Battery Remaining Longevity: 82 mo
Brady Statistic RV Percent Paced: 0.01 %
Date Time Interrogation Session: 20231024101729
HighPow Impedance: 72 Ohm
Implantable Lead Connection Status: 753985
Implantable Lead Implant Date: 20220406
Implantable Lead Location: 753860
Implantable Lead Model: 7122
Implantable Pulse Generator Implant Date: 20220406
Lead Channel Impedance Value: 512.5 Ohm
Lead Channel Pacing Threshold Amplitude: 0.5 V
Lead Channel Pacing Threshold Amplitude: 0.5 V
Lead Channel Pacing Threshold Pulse Width: 0.5 ms
Lead Channel Pacing Threshold Pulse Width: 0.5 ms
Lead Channel Sensing Intrinsic Amplitude: 11.8 mV
Lead Channel Setting Pacing Amplitude: 2.5 V
Lead Channel Setting Pacing Pulse Width: 0.5 ms
Lead Channel Setting Sensing Sensitivity: 0.5 mV
Pulse Gen Serial Number: 9907684

## 2022-09-26 MED ORDER — LOSARTAN POTASSIUM-HCTZ 100-12.5 MG PO TABS
1.0000 | ORAL_TABLET | Freq: Every day | ORAL | 6 refills | Status: DC
Start: 2022-09-26 — End: 2023-04-25

## 2022-09-26 MED ORDER — ATORVASTATIN CALCIUM 20 MG PO TABS: 20.0000 mg | ORAL_TABLET | Freq: Every day | ORAL | 3 refills | Status: DC

## 2022-09-26 MED ORDER — PANTOPRAZOLE SODIUM 40 MG PO TBEC: 40.0000 mg | DELAYED_RELEASE_TABLET | Freq: Every day | ORAL | 1 refills | Status: DC

## 2022-09-26 NOTE — Patient Instructions (Signed)
Medication Instructions:  - Your physician has recommended you make the following change in your medication:   1) STOP plain losartan  2) START Losartan-HCTZ 100-12.5 mg: - take 1 tablet by mouth once daily   *If you need a refill on your cardiac medications before your next appointment, please call your pharmacy*   Lab Work: - Your physician recommends that you return for lab work in: 1 week  [place] at [facility] 1st desk on the right to check in (REGISTRATION)  Lab hours: Monday- Friday (7:30 am- 5:30 pm)   If you have labs (blood work) drawn today and your tests are completely normal, you will receive your results only by: MyChart Message (if you have MyChart) OR A paper copy in the mail If you have any lab test that is abnormal or we need to change your treatment, we will call you to review the results.   Testing/Procedures: - none ordered   Follow-Up: At [facility], you and your health needs are our priority.  As part of our continuing mission to provide you with exceptional heart care, we have created designated Provider Care Teams.  These Care Teams include your primary Cardiologist (physician) and Advanced Practice Providers (APPs -  Physician Assistants and Nurse Practitioners) who all work together to provide you with the care you need, when you need it.  We recommend signing up for the patient portal called "MyChart".  Sign up information is provided on this After Visit Summary.  MyChart is used to connect with patients for Virtual Visits (Telemedicine).  Patients are able to view lab/test results, encounter notes, upcoming appointments, etc.  Non-urgent messages can be sent to your provider as well.   To learn more about what you can do with MyChart, go to [email or web address].    Your next appointment:   1 year(s)  The format for your next appointment:   In Person  Provider:   Virl Axe, MD    Other  Instructions N/a  Important Information About Sugar

## 2022-09-26 NOTE — Progress Notes (Signed)
Patient ID: Julie James, female   DOB: [birth year], 71 y.o.   MRN: [id number]      Patient Care Team: Verl Bangs, FNP as PCP - General (Family Medicine) Minna Merritts, MD as PCP - Cardiology (Cardiology) Deboraha Sprang, MD as PCP - Electrophysiology (Cardiology) Christene Lye, MD (General Surgery)     Julie James is a 71 y.o. female seen in follow-up for an aborted cardiac arrest for which she received a [place] ICD 4/22 with late ICD lead dislodgment requiring surgical repositioning 8/22 (GT)  The patient denies chest pain, shortness of breath, nocturnal dyspnea, orthopnea and some intermittent peripheral edema.  There have been no palpitations, lightheadedness or syncope.   Some daytime somnolence.  Denies snoring    Daughter Joelene Millin passed away from Lupus some years ago  DATE TEST EF%   3/22 Echo  55-60 %   4/22 LHC  55-65 %   4/22 MRI 91  LGE neg    Date   Cr              K Hgb   4/21 0.79 4.5 11.1   3/22 0.94 4.1 11.3   4/22 0.90 4.5  12.4   8/22 0.9 3.7 12.0    Records and Results Reviewed   Past Medical History:  Diagnosis Date   Cardiac arrest ([place]) 02/27/2021   a.  Witnessed out of hospital cardiac arrest s/p shock x2; b.  LHC 03/04/2021 w/ normal coronary arteries; c. TTE 03/02/21 - EF 55-60%, no RWMA, mild LVH, Gr1DD, nl RVSF and ventricular cavity size, and mild aortic valve sclerosis w/o evidence of stenosis   Hypercholesteremia    Hypertension     Past Surgical History:  Procedure Laterality Date   ICD IMPLANT N/A 03/09/2021   Procedure: ICD IMPLANT;  Surgeon: Deboraha Sprang, MD;  Location: [place] [facility];  Service: Cardiovascular;  Laterality: N/A;   LEAD REVISION/REPAIR N/A 07/25/2021   Procedure: ICD LEAD REVISION/REPAIR;  Surgeon: Evans Lance, MD;  Location: [place] [facility];  Service: Cardiovascular;  Laterality: N/A;   LEFT HEART CATH AND CORONARY ANGIOGRAPHY N/A 03/04/2021   Procedure: LEFT HEART CATH AND  CORONARY ANGIOGRAPHY;  Surgeon: Wellington Hampshire, MD;  Location: [place] [facility];  Service: Cardiovascular;  Laterality: N/A;    Current Meds  Medication Sig   amLODipine (NORVASC) 10 MG tablet Take 1 tablet (10 mg total) by mouth daily.   aspirin 81 MG EC tablet Take 1 tablet (81 mg total) by mouth daily. Swallow whole.   atorvastatin (LIPITOR) 20 MG tablet Take 1 tablet (20 mg total) by mouth daily.   losartan (COZAAR) 100 MG tablet Take 1 tablet (100 mg total) by mouth daily.   metoprolol succinate (TOPROL-XL) 50 MG 24 hr tablet Take 1 tablet (50 mg total) by mouth daily. Take with or immediately following a meal.   pantoprazole (PROTONIX) 40 MG tablet Take 1 tablet (40 mg total) by mouth at bedtime.   spironolactone (ALDACTONE) 50 MG tablet Take 1 tablet (50 mg total) by mouth daily.    Allergies  Allergen Reactions   Chlorthalidone Other (See Comments)    hypokalemia    Review of Systems negative except from HPI and PMH  Physical Exam: BP (!) 146/88   Pulse 78   Ht '5\' 6"'$  (1.676 m)   Wt 250 lb 9.6 oz (113.7 kg)   SpO2 97%   BMI 40.45 kg/m  Well developed and well nourished  in no acute distress HENT normal Neck supple with JVP-flat Clear Device pocket well healed; without hematoma or erythema.  There is no tethering  Regular rate and rhythm, no  gallop No  murmur Abd-soft with active BS No Clubbing cyanosis   edema Skin-warm and dry A & Oriented  Grossly normal sensory and motor function  ECG sinus at 78 Intervals 18/15/43 IVCD-left bundle branch like   Assessment and  Plan:  Aborted cardiac arrest  Nonischemic cardiomyopathy-mild  PVCs right bundle-dimorphic with superior axis and inferior axis  Left bundle branch block  Hypertension  HFpEF/dyspnea on exertion  ICD-Saint Jude  Note interval arrhythmias.  Blood pressure remains elevated.  Continue losartan, amlodipine and spironolactone.  With her intermittent peripheral edema, we will add  hydrochlorothiazide 12.5.  We will check a metabolic profile in about 2 weeks time.    With daytime somnolence and hypertension, I recommended a sleep study, she is not currently inclined.  Device function is normal. Programming changes none   See P[employer] for details

## 2022-09-29 ENCOUNTER — Encounter: Payer: Self-pay | Admitting: Internal Medicine

## 2022-09-29 LAB — PACEMAKER DEVICE OBSERVATION

## 2022-10-03 ENCOUNTER — Other Ambulatory Visit
Admission: RE | Admit: 2022-10-03 | Discharge: 2022-10-03 | Disposition: A | Discharge status: Home / Self Care | Payer: Medicare HMO | Attending: Internal Medicine | Admitting: Internal Medicine

## 2022-10-03 DIAGNOSIS — I5020 Unspecified systolic (congestive) heart failure: Secondary | ICD-10-CM | POA: Diagnosis present

## 2022-10-03 DIAGNOSIS — Z79899 Other long term (current) drug therapy: Secondary | ICD-10-CM | POA: Diagnosis present

## 2022-10-03 DIAGNOSIS — I493 Ventricular premature depolarization: Secondary | ICD-10-CM | POA: Insufficient documentation

## 2022-10-03 LAB — BASIC METABOLIC PANEL
Anion gap: 8 (ref 5–15)
BUN: 16 mg/dL (ref 8–23)
CO2: 25 mmol/L (ref 22–32)
Calcium: 9.5 mg/dL (ref 8.9–10.3)
Chloride: 104 mmol/L (ref 98–111)
Creatinine, Ser: 1.12 mg/dL — ABNORMAL HIGH (ref 0.44–1.00)
GFR, Estimated: 53 mL/min — ABNORMAL LOW (ref >60)
Glucose, Bld: 129 mg/dL — ABNORMAL HIGH (ref 70–99)
Potassium: 3.9 mmol/L (ref 3.5–5.1)
Sodium: 137 mmol/L (ref 135–145)

## 2022-10-30 ENCOUNTER — Telehealth: Payer: Self-pay | Admitting: Internal Medicine

## 2022-10-30 NOTE — Telephone Encounter (Signed)
Deboraha Sprang, MD 10/25/2022 11:35 AM EST     Please Inform Patient   Labs are normal x mildly worsened renal function Plz recheck in 2 months   Thanks

## 2022-10-30 NOTE — Telephone Encounter (Signed)
I called and spoke with the patient regarding her BMP results from 10/03/22. She is aware of her slightly elevated creatinine level and Dr. Olin Pia recommendations to repeat this in ~ 2 months.  She is aware that I will reach out to her in ~ 2 months to arrange for the repeat lab work.   The patient voices understanding of all of the above and is agreeable.

## 2022-12-06 ENCOUNTER — Ambulatory Visit (INDEPENDENT_AMBULATORY_CARE_PROVIDER_SITE_OTHER): Payer: Medicare HMO

## 2022-12-06 DIAGNOSIS — Z95810 Presence of automatic (implantable) cardiac defibrillator: Secondary | ICD-10-CM

## 2022-12-06 DIAGNOSIS — I469 Cardiac arrest, cause unspecified: Secondary | ICD-10-CM

## 2022-12-07 LAB — CUP PACEART REMOTE DEVICE CHECK
Battery Remaining Longevity: 79 mo
Battery Remaining Percentage: 77 %
Battery Voltage: 2.99 V
Brady Statistic RV Percent Paced: 1 %
Date Time Interrogation Session: 20240103020017
HighPow Impedance: 69 Ohm
HighPow Impedance: 69 Ohm
Implantable Lead Connection Status: 753985
Implantable Lead Implant Date: 20220406
Implantable Lead Location: 753860
Implantable Lead Model: 7122
Implantable Pulse Generator Implant Date: 20220406
Lead Channel Impedance Value: 430 Ohm
Lead Channel Pacing Threshold Amplitude: 0.5 V
Lead Channel Pacing Threshold Pulse Width: 0.5 ms
Lead Channel Sensing Intrinsic Amplitude: 11.8 mV
Lead Channel Setting Pacing Amplitude: 2.5 V
Lead Channel Setting Pacing Pulse Width: 0.5 ms
Lead Channel Setting Sensing Sensitivity: 0.5 mV
Pulse Gen Serial Number: 9907684

## 2023-01-01 NOTE — Progress Notes (Signed)
Remote ICD transmission.   

## 2023-01-08 ENCOUNTER — Other Ambulatory Visit: Payer: Self-pay | Admitting: *Deleted

## 2023-01-08 ENCOUNTER — Encounter: Payer: Self-pay | Admitting: *Deleted

## 2023-01-08 DIAGNOSIS — I5020 Unspecified systolic (congestive) heart failure: Secondary | ICD-10-CM

## 2023-01-08 DIAGNOSIS — N289 Disorder of kidney and ureter, unspecified: Secondary | ICD-10-CM

## 2023-02-28 ENCOUNTER — Other Ambulatory Visit
Admission: RE | Admit: 2023-02-28 | Discharge: 2023-02-28 | Disposition: A | Discharge status: Home / Self Care | Payer: Medicare HMO | Source: Ambulatory Visit | Attending: Internal Medicine | Admitting: Internal Medicine

## 2023-02-28 DIAGNOSIS — N289 Disorder of kidney and ureter, unspecified: Secondary | ICD-10-CM | POA: Insufficient documentation

## 2023-02-28 DIAGNOSIS — I5020 Unspecified systolic (congestive) heart failure: Secondary | ICD-10-CM | POA: Diagnosis present

## 2023-02-28 LAB — BASIC METABOLIC PANEL
Anion gap: 7 (ref 5–15)
BUN: 20 mg/dL (ref 8–23)
CO2: 25 mmol/L (ref 22–32)
Calcium: 9.3 mg/dL (ref 8.9–10.3)
Chloride: 105 mmol/L (ref 98–111)
Creatinine, Ser: 1.18 mg/dL — ABNORMAL HIGH (ref 0.44–1.00)
GFR, Estimated: 50 mL/min — ABNORMAL LOW (ref >60)
Glucose, Bld: 115 mg/dL — ABNORMAL HIGH (ref 70–99)
Potassium: 3.8 mmol/L (ref 3.5–5.1)
Sodium: 137 mmol/L (ref 135–145)

## 2023-03-12 ENCOUNTER — Ambulatory Visit (INDEPENDENT_AMBULATORY_CARE_PROVIDER_SITE_OTHER): Payer: Medicare HMO

## 2023-03-12 DIAGNOSIS — I469 Cardiac arrest, cause unspecified: Secondary | ICD-10-CM | POA: Diagnosis not present

## 2023-03-13 LAB — CUP PACEART REMOTE DEVICE CHECK
Battery Remaining Longevity: 77 mo
Battery Remaining Percentage: 75 %
Battery Voltage: 2.99 V
Brady Statistic RV Percent Paced: 1 %
Date Time Interrogation Session: 20240408020015
HighPow Impedance: 75 Ohm
HighPow Impedance: 75 Ohm
Implantable Lead Connection Status: 753985
Implantable Lead Implant Date: 20220406
Implantable Lead Location: 753860
Implantable Lead Model: 7122
Implantable Pulse Generator Implant Date: 20220406
Lead Channel Impedance Value: 450 Ohm
Lead Channel Pacing Threshold Amplitude: 0.5 V
Lead Channel Pacing Threshold Pulse Width: 0.5 ms
Lead Channel Sensing Intrinsic Amplitude: 11.8 mV
Lead Channel Setting Pacing Amplitude: 2.5 V
Lead Channel Setting Pacing Pulse Width: 0.5 ms
Lead Channel Setting Sensing Sensitivity: 0.5 mV
Pulse Gen Serial Number: 9907684

## 2023-04-02 ENCOUNTER — Telehealth: Payer: Self-pay

## 2023-04-02 NOTE — Telephone Encounter (Signed)
Spoke with pt and advised per Dr Graciela Husbands labs are normal with chronic MILD kidney insufficiency.  Pt verbalizes understanding and will continue to follow up with her PCP.

## 2023-04-02 NOTE — Telephone Encounter (Signed)
-----   Message from Duke Salvia, MD sent at 03/09/2023  4:39 PM EDT ----- Please Inform Patient   Labs are normal xMild   chronic renal insufficiency    Thanks

## 2023-04-19 NOTE — Progress Notes (Signed)
Remote ICD transmission.   

## 2023-04-25 ENCOUNTER — Other Ambulatory Visit: Payer: Self-pay | Admitting: Internal Medicine

## 2023-04-26 ENCOUNTER — Other Ambulatory Visit: Payer: Self-pay | Admitting: Cardiovascular Disease

## 2023-04-26 DIAGNOSIS — K219 Gastro-esophageal reflux disease without esophagitis: Secondary | ICD-10-CM

## 2023-04-26 NOTE — Telephone Encounter (Signed)
Please advise if ok to refill Pantoprazole.  

## 2023-05-29 ENCOUNTER — Encounter: Payer: Self-pay | Admitting: Cardiovascular Disease

## 2023-05-29 NOTE — Telephone Encounter (Signed)
Error

## 2023-05-30 ENCOUNTER — Telehealth: Payer: Self-pay | Admitting: Cardiovascular Disease

## 2023-05-30 DIAGNOSIS — K219 Gastro-esophageal reflux disease without esophagitis: Secondary | ICD-10-CM

## 2023-05-30 NOTE — Telephone Encounter (Signed)
*  STAT* If patient is at the pharmacy, call can be transferred to refill team.   1. Which medications need to be refilled? (please list name of each medication and dose if known)   losartan-hydrochlorothiazide (HYZAAR) 100-12.5 MG tablet    pantoprazole (PROTONIX) 40 MG tablet    2. Which pharmacy/location (including street and city if local pharmacy) is medication to be sent to?  [facility] - [place], [place] - [address]      3. Do they need a 30 day or 90 day supply? 90 day   Pt is completely out of medication

## 2023-05-31 MED ORDER — LOSARTAN POTASSIUM-HCTZ 100-12.5 MG PO TABS: 1.0000 | ORAL_TABLET | Freq: Every day | ORAL | 0 refills | Status: DC

## 2023-05-31 MED ORDER — PANTOPRAZOLE SODIUM 40 MG PO TBEC
40.0000 mg | DELAYED_RELEASE_TABLET | Freq: Every day | ORAL | 0 refills | Status: DC
Start: 2023-05-31 — End: 2023-08-10

## 2023-05-31 NOTE — Telephone Encounter (Signed)
Requested Prescriptions   Signed Prescriptions Disp Refills   losartan-hydrochlorothiazide (HYZAAR) 100-12.5 MG tablet 90 tablet 0    Sig: Take 1 tablet by mouth daily.    Authorizing Provider: Duke Salvia    Ordering User: Thayer Headings, Tywon Niday L   pantoprazole (PROTONIX) 40 MG tablet 90 tablet 0    Sig: Take 1 tablet (40 mg total) by mouth at bedtime.    Authorizing Provider: Antonieta Iba    Ordering User: Thayer Headings, Kristiann Noyce L

## 2023-06-11 ENCOUNTER — Ambulatory Visit (INDEPENDENT_AMBULATORY_CARE_PROVIDER_SITE_OTHER): Payer: Medicare HMO

## 2023-06-11 DIAGNOSIS — I469 Cardiac arrest, cause unspecified: Secondary | ICD-10-CM

## 2023-06-11 LAB — CUP PACEART REMOTE DEVICE CHECK
Battery Remaining Longevity: 76 mo
Battery Remaining Percentage: 73 %
Battery Voltage: 2.99 V
Brady Statistic RV Percent Paced: 1 %
Date Time Interrogation Session: 20240708020016
HighPow Impedance: 78 Ohm
HighPow Impedance: 78 Ohm
Implantable Lead Connection Status: 753985
Implantable Lead Implant Date: 20220406
Implantable Lead Location: 753860
Implantable Lead Model: 7122
Implantable Pulse Generator Implant Date: 20220406
Lead Channel Impedance Value: 460 Ohm
Lead Channel Pacing Threshold Amplitude: 0.5 V
Lead Channel Pacing Threshold Pulse Width: 0.5 ms
Lead Channel Sensing Intrinsic Amplitude: 11.8 mV
Lead Channel Setting Pacing Amplitude: 2.5 V
Lead Channel Setting Pacing Pulse Width: 0.5 ms
Lead Channel Setting Sensing Sensitivity: 0.5 mV
Pulse Gen Serial Number: 9907684

## 2023-06-28 NOTE — Progress Notes (Signed)
Remote ICD transmission.   

## 2023-08-09 NOTE — Progress Notes (Signed)
Cardiology Office Note  Date:  08/10/2023   ID:  Julie James, DOB [birth year], MRN [id number]  PCP:  Tarri Fuller, FNP   Chief Complaint  Patient presents with   12 month follow up     "Doing well." Medications reviewed by the patient verbally.     HPI:  Julie James is a 72 y.o. female with hx of  admitted February 27, 2021 for cardiac arrest  Had CPR, shock x2, Normal ejection fraction on echocardiogram Full recovery,  Cath: Normal coronary arteries.  Saint Jude ICD 4/22 with late ICD lead dislodgment requiring surgical repositioning 8/22 (GT) presents for follow-up of her cardiac disease, history of cardiac arrest  Last seen by myself in clinic September 2023 Seen by EP October 2023  Followed by device clinic Was referred for sleep study  On further discussion she reports that she feels well Denies any tachypalpitations concerning for arrhythmia No shortness of breath on exertion, denies chest pain  Blood pressure well-controlled on current regimen  Prior imaging studies  Echo 3/22: normal ef 55%  Cardiac MRI 4/22,  1. Normal LV size with mild concentric LV hypertrophy. Septal-lateral dyssynchrony consistent with LBBB, also mild inferior and inferolateral hypokinesis. EF 46%. 2.  Normal RV size and systolic function, EF 49%. 3. No myocardial LGE, so no definitive evidence for prior MI, infiltrative disease, or myocarditis. Normal extracellular fluid volume percentage.  EKG personally reviewed by myself on todays visit EKG Interpretation Date/Time:  Friday August 10 2023 10:21:59 EDT Ventricular Rate:  64 PR Interval:  164 QRS Duration:  152 QT Interval:  450 QTC Calculation: 464 R Axis:   -33  Text Interpretation: Normal sinus rhythm Left axis deviation Left bundle branch block When compared with ECG of 24-Jul-2021 00:25, No significant change was found Confirmed by Julien Nordmann [phone]) on 08/10/2023 10:45:52 AM   Prior events  reviewed 02/27/21, with witnessed and aborted cardiac arrest s/p immediate CPR.  911 was called and on fire/rescue arrival, patient was reportedly in a shockable rhythm.  She received 2 shocks.  On EMS arrival, she had a pulse and was in what appeared to be a normal sinus rhythm.    Code STEMI was activated due to concern for anterior precordial lead elevations.  En route, she was minimally responsive.  She arrived to the ED with pulses, unresponsive.  Code STEMI was cancelled by Dr. Kristine Royal.  She was intubated in the emergency room due to being unresponsive.  In the ED, she developed WCT and was started on amiodarone which was subsequently stopped due to bradycardia.   HS-Tn initially 13 with a delta of 105, ultimately peaking at 502 and down trending.   Echo on 03/02/2021 showed an EF of 55-60%, no RWMA, mild LVH, Gr1DD, normal RVSF and ventricular cavity size, and mild aortic valve sclerosis without evidence of stenosis.   MRI brain showed no acute intracranial process. She was treated with IV heparin for 48 hours. Initial potassium was low at 2.7, which was repleted. Magnesium 2.0.  As outlined above, there were initial concerns for anoxic brain injury, though she has subsequently had return of near normal cognitive function.  Echo with normal EF. LHC with normal LVEDP and normal coronaries. cMRI with EF 46% and no LGE.    underwent implantation of a St. Jude single chamber ICD    PMH:   has a past medical history of Cardiac arrest (HCC) (02/27/2021), Hypercholesteremia, and Hypertension.  PSH:    Past Surgical History:  Procedure Laterality Date   ICD IMPLANT N/A 03/09/2021   Procedure: ICD IMPLANT;  Surgeon: Duke Salvia, MD;  Location: [facility] INVASIVE CV LAB;  Service: Cardiovascular;  Laterality: N/A;   LEAD REVISION/REPAIR N/A 07/25/2021   Procedure: ICD LEAD REVISION/REPAIR;  Surgeon: Marinus Maw, MD;  Location: [facility] INVASIVE CV LAB;  Service: Cardiovascular;  Laterality: N/A;   LEFT  HEART CATH AND CORONARY ANGIOGRAPHY N/A 03/04/2021   Procedure: LEFT HEART CATH AND CORONARY ANGIOGRAPHY;  Surgeon: Iran Ouch, MD;  Location: [facility] INVASIVE CV LAB;  Service: Cardiovascular;  Laterality: N/A;    Current Outpatient Medications  Medication Sig Dispense Refill   amLODipine (NORVASC) 10 MG tablet Take 1 tablet (10 mg total) by mouth daily. 90 tablet 3   aspirin 81 MG EC tablet Take 1 tablet (81 mg total) by mouth daily. Swallow whole. 30 tablet 11   atorvastatin (LIPITOR) 20 MG tablet Take 1 tablet (20 mg total) by mouth daily. 90 tablet 3   losartan-hydrochlorothiazide (HYZAAR) 100-12.5 MG tablet Take 1 tablet by mouth daily. 90 tablet 0   metoprolol succinate (TOPROL-XL) 50 MG 24 hr tablet Take 1 tablet (50 mg total) by mouth daily. Take with or immediately following a meal. 90 tablet 3   pantoprazole (PROTONIX) 40 MG tablet Take 1 tablet (40 mg total) by mouth at bedtime. 90 tablet 0   spironolactone (ALDACTONE) 50 MG tablet Take 1 tablet (50 mg total) by mouth daily. 90 tablet 3   No current facility-administered medications for this visit.    Allergies:   Chlorthalidone   Social History:  The patient  reports that she has never smoked. She has never used smokeless tobacco. She reports that she does not drink alcohol and does not use drugs.   Family History:   family history includes Cancer in her sister and sister.    Review of Systems: Review of Systems  Constitutional: Negative.   HENT: Negative.    Respiratory: Negative.    Cardiovascular: Negative.   Gastrointestinal: Negative.   Musculoskeletal: Negative.   Neurological: Negative.   Psychiatric/Behavioral: Negative.    All other systems reviewed and are negative.   PHYSICAL EXAM: VS:  BP 130/62 (BP Location: Left Arm, Patient Position: Sitting, Cuff Size: Normal)   Pulse 64   Ht 5\' 5"  (1.651 m)   Wt 242 lb 4 oz (109.9 kg)   SpO2 96%   BMI 40.31 kg/m  , BMI Body mass index is 40.31  kg/m. Constitutional:  oriented to person, place, and time. No distress.  HENT:  Head: Grossly normal Eyes:  no discharge. No scleral icterus.  Neck: No JVD, no carotid bruits  Cardiovascular: Regular rate and rhythm, no murmurs appreciated Pulmonary/Chest: Clear to auscultation bilaterally, no wheezes or rails Abdominal: Soft.  no distension.  no tenderness.  Musculoskeletal: Normal range of motion Neurological:  normal muscle tone. Coordination normal. No atrophy Skin: Skin warm and dry Psychiatric: normal affect, pleasant  Recent Labs: 02/28/2023: BUN 20; Creatinine, Ser 1.18; Potassium 3.8; Sodium 137    Lipid Panel Lab Results  Component Value Date   CHOL 179 02/22/2021   HDL 51 02/22/2021   LDLCALC 106 (H) 02/22/2021   TRIG 88 02/28/2021      Wt Readings from Last 3 Encounters:  08/10/23 242 lb 4 oz (109.9 kg)  09/26/22 250 lb 9.6 oz (113.7 kg)  08/08/22 251 lb (113.9 kg)     ASSESSMENT AND PLAN:  Problem List Items Addressed This Visit  Cardiology Problems   Hypertension   Relevant Orders   EKG 12-Lead (Completed)   Hyperlipidemia   Cardiac arrest (HCC) - Primary   Relevant Orders   EKG 12-Lead (Completed)     Other   GERD (gastroesophageal reflux disease)   Other Visit Diagnoses     HFrEF (heart failure with reduced ejection fraction) (HCC)       Relevant Orders   EKG 12-Lead (Completed)   Renal insufficiency       ICD (implantable cardioverter-defibrillator) in place       Relevant Orders   EKG 12-Lead (Completed)   PVCs (premature ventricular contractions)       Relevant Orders   EKG 12-Lead (Completed)   Prediabetes          Cardiac arrest Nonischemic, no coronary disease by catheterization Normal ejection fraction, cardiac MRI reviewed Followed by Dr. Graciela Husbands for ICD  Essential hypertension Blood pressure is well controlled on today's visit. No changes made to the medications.  Morbid obesity We have encouraged continued  exercise, careful diet management in an effort to lose weight. Patient  ICD in place [facility], downloads reviewed Followed by Dr. Graciela Husbands   Total encounter time more than 30 minutes  Greater than 50% was spent in counseling and coordination of care with the patient    Signed, Dossie Arbour, M.D., Ph.D. [facility] [place], [place] [phone]

## 2023-08-10 ENCOUNTER — Encounter: Payer: Self-pay | Admitting: Cardiovascular Disease

## 2023-08-10 ENCOUNTER — Ambulatory Visit: Payer: Medicare HMO | Attending: Cardiovascular Disease | Admitting: Cardiovascular Disease

## 2023-08-10 VITALS — BP 130/62 | HR 64 | Ht 65.0 in | Wt 242.2 lb

## 2023-08-10 DIAGNOSIS — Z95810 Presence of automatic (implantable) cardiac defibrillator: Secondary | ICD-10-CM

## 2023-08-10 DIAGNOSIS — E782 Mixed hyperlipidemia: Secondary | ICD-10-CM

## 2023-08-10 DIAGNOSIS — I5020 Unspecified systolic (congestive) heart failure: Secondary | ICD-10-CM | POA: Diagnosis not present

## 2023-08-10 DIAGNOSIS — N289 Disorder of kidney and ureter, unspecified: Secondary | ICD-10-CM | POA: Diagnosis not present

## 2023-08-10 DIAGNOSIS — R7303 Prediabetes: Secondary | ICD-10-CM

## 2023-08-10 DIAGNOSIS — K219 Gastro-esophageal reflux disease without esophagitis: Secondary | ICD-10-CM | POA: Diagnosis not present

## 2023-08-10 DIAGNOSIS — I469 Cardiac arrest, cause unspecified: Secondary | ICD-10-CM

## 2023-08-10 DIAGNOSIS — I10 Essential (primary) hypertension: Secondary | ICD-10-CM

## 2023-08-10 DIAGNOSIS — I493 Ventricular premature depolarization: Secondary | ICD-10-CM

## 2023-08-10 MED ORDER — PANTOPRAZOLE SODIUM 40 MG PO TBEC
40.0000 mg | DELAYED_RELEASE_TABLET | Freq: Every day | ORAL | 3 refills | Status: DC
Start: 2023-08-10 — End: 2024-09-03

## 2023-08-10 MED ORDER — AMLODIPINE BESYLATE 10 MG PO TABS: 10.0000 mg | ORAL_TABLET | Freq: Every day | ORAL | 3 refills | Status: DC

## 2023-08-10 MED ORDER — METOPROLOL SUCCINATE ER 50 MG PO TB24
50.0000 mg | ORAL_TABLET | Freq: Every day | ORAL | 3 refills | Status: DC
Start: 2023-08-10 — End: 2024-09-29

## 2023-08-10 MED ORDER — ATORVASTATIN CALCIUM 20 MG PO TABS
20.0000 mg | ORAL_TABLET | Freq: Every day | ORAL | 3 refills | Status: DC
Start: 2023-08-10 — End: 2024-05-06

## 2023-08-10 MED ORDER — SPIRONOLACTONE 50 MG PO TABS
50.0000 mg | ORAL_TABLET | Freq: Every day | ORAL | 3 refills | Status: DC
Start: 2023-08-10 — End: 2024-08-07

## 2023-08-10 NOTE — Patient Instructions (Signed)

## 2023-08-29 ENCOUNTER — Other Ambulatory Visit: Payer: Self-pay | Admitting: Internal Medicine

## 2023-09-10 ENCOUNTER — Ambulatory Visit (INDEPENDENT_AMBULATORY_CARE_PROVIDER_SITE_OTHER): Payer: Medicare HMO

## 2023-09-10 DIAGNOSIS — I469 Cardiac arrest, cause unspecified: Secondary | ICD-10-CM

## 2023-09-11 LAB — CUP PACEART REMOTE DEVICE CHECK
Battery Remaining Longevity: 74 mo
Battery Remaining Percentage: 72 %
Battery Voltage: 2.98 V
Brady Statistic RV Percent Paced: 1 %
Date Time Interrogation Session: 20241007020016
HighPow Impedance: 73 Ohm
HighPow Impedance: 73 Ohm
Implantable Lead Connection Status: 753985
Implantable Lead Implant Date: 20220406
Implantable Lead Location: 753860
Implantable Lead Model: 7122
Implantable Pulse Generator Implant Date: 20220406
Lead Channel Impedance Value: 480 Ohm
Lead Channel Pacing Threshold Amplitude: 0.5 V
Lead Channel Pacing Threshold Pulse Width: 0.5 ms
Lead Channel Sensing Intrinsic Amplitude: 11.8 mV
Lead Channel Setting Pacing Amplitude: 2.5 V
Lead Channel Setting Pacing Pulse Width: 0.5 ms
Lead Channel Setting Sensing Sensitivity: 0.5 mV
Pulse Gen Serial Number: 9907684

## 2023-09-27 NOTE — Progress Notes (Signed)
Remote ICD transmission.   

## 2023-10-02 ENCOUNTER — Encounter: Payer: Self-pay | Admitting: Internal Medicine

## 2023-10-02 ENCOUNTER — Ambulatory Visit: Payer: Medicare HMO | Attending: Internal Medicine | Admitting: Internal Medicine

## 2023-10-02 VITALS — BP 144/70 | HR 70 | Ht 65.0 in | Wt 240.4 lb

## 2023-10-02 DIAGNOSIS — Z95810 Presence of automatic (implantable) cardiac defibrillator: Secondary | ICD-10-CM | POA: Diagnosis not present

## 2023-10-02 DIAGNOSIS — I10 Essential (primary) hypertension: Secondary | ICD-10-CM

## 2023-10-02 DIAGNOSIS — E782 Mixed hyperlipidemia: Secondary | ICD-10-CM | POA: Diagnosis not present

## 2023-10-02 DIAGNOSIS — I469 Cardiac arrest, cause unspecified: Secondary | ICD-10-CM

## 2023-10-02 LAB — PACEMAKER DEVICE OBSERVATION

## 2023-10-02 MED ORDER — DAPAGLIFLOZIN PROPANEDIOL 10 MG PO TABS: 10.0000 mg | ORAL_TABLET | Freq: Every day | ORAL | 3 refills | Status: DC

## 2023-10-02 NOTE — Patient Instructions (Addendum)
Medication Instructions:  START Farxiga 10 mg daily   *If you need a refill on your cardiac medications before your next appointment, please call your pharmacy*    Testing/Procedures:  Your physician has requested that you have an echocardiogram. Echocardiography is a painless test that uses sound waves to create images of your heart. It provides your doctor with information about the size and shape of your heart and how well your heart's chambers and valves are working.   You may receive an ultrasound enhancing agent through an IV if needed to better visualize your heart during the echo. This procedure takes approximately one hour.  There are no restrictions for this procedure.  This will take place at [address] [address] [facility], [place] [address]     Follow-Up: At [facility], you and your health needs are our priority.  As part of our continuing mission to provide you with exceptional heart care, we have created designated Provider Care Teams.  These Care Teams include your primary Cardiologist (physician) and Advanced Practice Providers (APPs -  Physician Assistants and Nurse Practitioners) who all work together to provide you with the care you need, when you need it.  We recommend signing up for the patient portal called "MyChart".  Sign up information is provided on this After Visit Summary.  MyChart is used to connect with patients for Virtual Visits (Telemedicine).  Patients are able to view lab/test results, encounter notes, upcoming appointments, etc.  Non-urgent messages can be sent to your provider as well.   To learn more about what you can do with MyChart, go to [email or web address].    Your next appointment:   6-8 week(s)  Provider:   You may see Julien Nordmann, MD or one of the following Advanced Practice Providers on your designated Care Team:   Nicolasa Ducking, NP Eula Listen, PA-C Cadence Fransico Michael, PA-C Charlsie Quest, NP    Back  to see Michele Rockers, MD in 12 months.   Other Instructions Check BP at home-

## 2023-10-02 NOTE — Progress Notes (Signed)
Patient ID: Julie James, female   DOB: 1950-12-23, 72 y.o.   MRN: [id number]      Patient Care Team: Patient, No Pcp Per as PCP - General (General Practice) Antonieta Iba, MD as PCP - Cardiology (Cardiology) Duke Salvia, MD as PCP - Electrophysiology (Cardiology) Kieth Brightly, MD (General Surgery)     Julie James is a 72 y.o. female seen in follow-up for an aborted cardiac arrest for which she received a Saint Jude ICD 4/22 with late ICD lead dislodgment requiring surgical repositioning 8/22 (GT) The patient denies chest pain, nocturnal dyspnea, orthopnea or peripheral edema.  There have been no palpitations, lightheadedness or syncope.  Complains of dyspnea on exertion 1 flight of stairs and 100 feet..  Diet is salt deplete at home; however she eats out frequently.  Daughter Julie James passed away from Lupus some years ago  DATE TEST EF%   3/22 Echo  55-60 %   4/22 LHC  55-65 %   4/22 MRI 46  LGE neg         Date   Cr              K Hgb   4/21 0.79 4.5 11.1   3/22 0.94 4.1 11.3   4/22 0.90 4.5  12.4   8/22 0.9 3.7 12.0   10/23   12.4   3/24 1.18 3.8     Records and Results Reviewed   Past Medical History:  Diagnosis Date   Cardiac arrest (HCC) 02/27/2021   a.  Witnessed out of hospital cardiac arrest s/p shock x2; b.  LHC 03/04/2021 w/ normal coronary arteries; c. TTE 03/02/21 - EF 55-60%, no RWMA, mild LVH, Gr1DD, nl RVSF and ventricular cavity size, and mild aortic valve sclerosis w/o evidence of stenosis   Hypercholesteremia    Hypertension     Past Surgical History:  Procedure Laterality Date   ICD IMPLANT N/A 03/09/2021   Procedure: ICD IMPLANT;  Surgeon: Duke Salvia, MD;  Location: [facility] INVASIVE CV LAB;  Service: Cardiovascular;  Laterality: N/A;   LEAD REVISION/REPAIR N/A 07/25/2021   Procedure: ICD LEAD REVISION/REPAIR;  Surgeon: Marinus Maw, MD;  Location: [facility] INVASIVE CV LAB;  Service: Cardiovascular;  Laterality: N/A;   LEFT  HEART CATH AND CORONARY ANGIOGRAPHY N/A 03/04/2021   Procedure: LEFT HEART CATH AND CORONARY ANGIOGRAPHY;  Surgeon: Iran Ouch, MD;  Location: [facility] INVASIVE CV LAB;  Service: Cardiovascular;  Laterality: N/A;    Current Meds  Medication Sig   amLODipine (NORVASC) 10 MG tablet Take 1 tablet (10 mg total) by mouth daily.   aspirin 81 MG EC tablet Take 1 tablet (81 mg total) by mouth daily. Swallow whole.   atorvastatin (LIPITOR) 20 MG tablet Take 1 tablet (20 mg total) by mouth daily.   losartan-hydrochlorothiazide (HYZAAR) 100-12.5 MG tablet Take 1 tablet by mouth daily.   metoprolol succinate (TOPROL-XL) 50 MG 24 hr tablet Take 1 tablet (50 mg total) by mouth daily. Take with or immediately following a meal.   pantoprazole (PROTONIX) 40 MG tablet Take 1 tablet (40 mg total) by mouth at bedtime.   spironolactone (ALDACTONE) 50 MG tablet Take 1 tablet (50 mg total) by mouth daily.    Allergies  Allergen Reactions   Chlorthalidone Other (See Comments)    hypokalemia    Review of Systems negative except from HPI and PMH  Physical Exam: BP (!) 144/70 (BP Location: Left Arm, Patient Position: Sitting, Cuff Size: Large)  Pulse 70   Ht 5\' 5"  (1.651 m)   Wt 240 lb 6 oz (109 kg)   SpO2 98%   BMI 40.00 kg/m  Well developed and well nourished in no acute distress HENT normal Neck supple with JVP-flat Clear Device pocket well healed; without hematoma or erythema.  There is no tethering  Regular rate and rhythm, no  gallop  murmur Abd-soft with active BS No Clubbing cyanosis  edema Skin-warm and dry A & Oriented  Grossly normal sensory and motor function  ECG sinus at 70 Intervals 15/15/42 Left bundle branch block  Device function is normal. Programming changes none   See Paceart for details     Assessment and  Plan:  Aborted cardiac arrest  Nonischemic cardiomyopathy-mild  PVCs right bundle-dimorphic with superior axis and inferior axis  Left bundle branch  block  Hypertension  HFpEF/dyspnea on exertion  ICD-Saint Jude   No interval arrhythmias.  Cardiomyopathy has been stable; has been a couple of years.  Will reassess LVEF.  For now we will continue losartan metoprolol and Aldactone.  In the event that she has worsening EF given her blood pressure issues we will change the losartan to Entresto.  With her dyspnea and her modest depression of LVEF, we will begin [place].  Blood pressure remains inadequately controlled despite 5 medications.  She does not have symptoms to suggest sleep apnea as a primary issue.  There is a literature to suggest some benefit from the SGLT2 initiated as above.

## 2023-10-06 LAB — CUP PACEART INCLINIC DEVICE CHECK
Battery Remaining Longevity: 74 mo
Brady Statistic RV Percent Paced: 0.03 %
Date Time Interrogation Session: 20241029162756
HighPow Impedance: 78.75 Ohm
Implantable Lead Connection Status: 753985
Implantable Lead Implant Date: 20220406
Implantable Lead Location: 753860
Implantable Lead Model: 7122
Implantable Pulse Generator Implant Date: 20220406
Lead Channel Impedance Value: 525 Ohm
Lead Channel Pacing Threshold Amplitude: 0.75 V
Lead Channel Pacing Threshold Amplitude: 0.75 V
Lead Channel Pacing Threshold Pulse Width: 0.5 ms
Lead Channel Pacing Threshold Pulse Width: 0.5 ms
Lead Channel Sensing Intrinsic Amplitude: 11.8 mV
Lead Channel Setting Pacing Amplitude: 2.5 V
Lead Channel Setting Pacing Pulse Width: 0.5 ms
Lead Channel Setting Sensing Sensitivity: 0.5 mV
Pulse Gen Serial Number: 9907684

## 2023-10-10 ENCOUNTER — Telehealth: Payer: Self-pay | Admitting: Internal Medicine

## 2023-10-10 NOTE — Telephone Encounter (Signed)
Application started and pending review with patient and provider signature. Placed in Pending patient assistance folder

## 2023-10-10 NOTE — Telephone Encounter (Signed)
Pt c/o medication issue:  1. Name of Medication: dapagliflozin propanediol (FARXIGA) 10 MG TABS tablet   2. How are you currently taking this medication (dosage and times per day)?   3. Are you having a reaction (difficulty breathing--STAT)? no  4. What is your medication issue? Patient states the medication is too expensive. Calling to see what other options is there. Please advise

## 2023-10-12 NOTE — Telephone Encounter (Signed)
No answer/No voicemail box has been set up. 

## 2023-10-16 NOTE — Telephone Encounter (Signed)
No answer/No voicemail box has been set up. Closing encounter

## 2023-10-25 ENCOUNTER — Ambulatory Visit: Payer: Medicare HMO | Attending: Internal Medicine

## 2023-11-19 NOTE — Progress Notes (Deleted)
Cardiology Clinic Note   Date: 11/19/2023 ID: Jaynie Collins, DOB [birth year], MRN [id number]  Primary Cardiologist:  Julien Nordmann, MD  Patient Profile    Julie James is a 72 y.o. female who presents to the clinic today for ***    Past medical history significant for: Cardiac arrest. Witnessed arrest at home 02/27/2021.  Shocked x 2 by EMS prior to arrival. [facility] 03/04/2021: Normal coronary arteries.  Normal LV systolic function and LVEDP. Cardiac MRI 03/07/2021: Normal LV size with mild concentric LVH.  Septal-lateral dyssynchrony consistent with LBBB, also mild inferior and inferior lateral hypokinesis.  EF 46%.  Normal RV size/function EF 49%.  No myocardial LGE with no definitive evidence for prior MI, infiltrative disease, myocarditis.  Normal extracellular fluid volume presented. ICD placement 03/09/2021. Lead revision/repair 07/25/2021. Clinic device check 10/02/2023: Normal device function.  No changes made. LBBB. Nonischemic cardiomyopathy. Echo 03/02/2021: EF 55 to 60%.  No RWMA.  Mild LVH.  Grade I DD.  Normal RV size/function.  No significant valvular abnormalities. PVCs. 3-day ZIO 06/29/2021: HR 52 to 135 bpm, average 67 bpm.  Predominantly sinus rhythm.  3.2% PVC burden. Hypertension. Hyperlipidemia. GERD.  In summary, patient presented to the ED via EMS on 02/27/2021 as a witness arrest at home.  Patient's husband reported she was in her usual state of health when she fell backwards getting into the house and went unresponsive.  Husband found her confused on the ground and then stopped breathing.  He immediately began CPR.  EMS, patient had shockable rhythm and she received shocks x 2.  She arrived to the ED unresponsive with palpable pulses.  She was initially activated as a code STEMI however review of EKG did not appear significantly changed from prior ST elevation was suspected to be secondary to LAFB.  Due to patient's persistent unresponsiveness hypothermic protocol  was initiated.  She was intubated in the ED.  She developed WCT and was started on amiodarone.  She developed bradycardia and amiodarone was stopped.  MRI brain showed no acute intracranial process.  Echo showed EF 55 to 60%, no RWMA, mild LVH, Grade I DD, normal RV size/function, mild aortic valve sclerosis without stenosis.  Patient returned to baseline cognitive state.  She underwent ICD placement.  She was discharged on 03/09/2021.  In August 2022 patient presented to the ED with reports of feeling as though her ICD had moved.  She reported spasms in her chest similar to hiccups.  Chest x-ray demonstrated flipping of her ICD generator and retraction of the lead.  She underwent lead revision/repair.     History of Present Illness    Julie James is followed by Dr. Mariah Milling and Dr. Graciela Husbands for the above outlined history.   Patient was last seen in the office by Dr. Mariah Milling on 08/10/2023 for routine follow-up.  She was doing well at that time and no changes were made.  Her last visit with Dr. Graciela Husbands was on 10/02/2023 for routine follow-up.  Echo was ordered for reassessment of LVEF but does not appear to have been completed.  Secondary to complaints of dyspnea she was started on Farxiga.  Today, patient ***  Cardiac arrest S/p ICD implantation with lead repair/revision August 2022.  Last device check showed normal device function October 2024.  Witnessed arrest at home March 2022.  ROSC achieved with CPR and shocked x 2.  LHC showed normal coronary arteries.  Cardiac MRI showed septal/lateral dyssynchrony consistent with LBBB and mildly depressed LVEF.   -Continue to  follow with EP.  Nonischemic cardiomyopathy Echo March normal LV/RV function, no RWMA, Grade I DD.  Patient*** Euvolemic and well compensated on exam. -Continue Farxiga, Hyzaar, Toprol, spironolactone.  Palpitations/PVCs 3 day Zio July 2022 showed 3% PVC burden. Patient *** -Continue Toprol  Hypertension BP today *** -Continue  amlodipine, Hyzaar, spironolactone.   ROS: All other systems reviewed and are otherwise negative except as noted in History of Present Illness.  Studies Reviewed       ***  Risk Assessment/Calculations    {Does this patient have ATRIAL FIBRILLATION?:[phone]} No BP recorded.  {Refresh Note OR Click here to enter BP  :1}***        Physical Exam    VS:  There were no vitals taken for this visit. , BMI There is no height or weight on file to calculate BMI.  GEN: Well nourished, well developed, in no acute distress. Neck: No JVD or carotid bruits. Cardiac: *** RRR. No murmurs. No rubs or gallops.   Respiratory:  Respirations regular and unlabored. Clear to auscultation without rales, wheezing or rhonchi. GI: Soft, nontender, nondistended. Extremities: Radials/DP/PT 2+ and equal bilaterally. No clubbing or cyanosis. No edema ***  Skin: Warm and dry, no rash. Neuro: Strength intact.  Assessment & Plan   ***  Disposition: ***     {Are you ordering a CV Procedure (e.g. stress test, cath, DCCV, TEE, etc)?   Press F2        :[id number]}   Signed, Etta Grandchild. Nike Southwell, DNP, NP-C

## 2023-11-22 ENCOUNTER — Ambulatory Visit: Payer: Medicare HMO | Attending: Student | Admitting: Student

## 2023-11-23 ENCOUNTER — Encounter: Payer: Self-pay | Admitting: Student

## 2023-11-23 ENCOUNTER — Other Ambulatory Visit: Payer: Self-pay | Admitting: Internal Medicine

## 2023-12-10 ENCOUNTER — Ambulatory Visit (INDEPENDENT_AMBULATORY_CARE_PROVIDER_SITE_OTHER): Payer: Self-pay

## 2023-12-10 DIAGNOSIS — I469 Cardiac arrest, cause unspecified: Secondary | ICD-10-CM

## 2023-12-12 LAB — CUP PACEART REMOTE DEVICE CHECK
Battery Remaining Longevity: 72 mo
Battery Remaining Percentage: 70 %
Battery Voltage: 2.98 V
Brady Statistic RV Percent Paced: 1 %
Date Time Interrogation Session: 20250106020016
HighPow Impedance: 71 Ohm
HighPow Impedance: 71 Ohm
Implantable Lead Connection Status: 753985
Implantable Lead Implant Date: 20220406
Implantable Lead Location: 753860
Implantable Lead Model: 7122
Implantable Pulse Generator Implant Date: 20220406
Lead Channel Impedance Value: 450 Ohm
Lead Channel Pacing Threshold Amplitude: 0.75 V
Lead Channel Pacing Threshold Pulse Width: 0.5 ms
Lead Channel Sensing Intrinsic Amplitude: 11.8 mV
Lead Channel Setting Pacing Amplitude: 2.5 V
Lead Channel Setting Pacing Pulse Width: 0.5 ms
Lead Channel Setting Sensing Sensitivity: 0.5 mV
Pulse Gen Serial Number: 9907684

## 2023-12-20 ENCOUNTER — Encounter: Payer: Self-pay | Admitting: Internal Medicine

## 2024-01-21 NOTE — Progress Notes (Signed)
 Remote ICD transmission.

## 2024-01-21 NOTE — Addendum Note (Signed)
Addended by: Geralyn Flash D on: 01/21/2024 04:36 PM   Modules accepted: Orders

## 2024-02-20 ENCOUNTER — Encounter: Payer: Self-pay | Admitting: Internal Medicine

## 2024-02-20 NOTE — Progress Notes (Signed)
Unable to contact patient to schedule echocardiogram, letter sent. Order cancelled.

## 2024-03-10 ENCOUNTER — Ambulatory Visit (INDEPENDENT_AMBULATORY_CARE_PROVIDER_SITE_OTHER): Payer: Self-pay

## 2024-03-10 DIAGNOSIS — I469 Cardiac arrest, cause unspecified: Secondary | ICD-10-CM

## 2024-03-11 LAB — CUP PACEART REMOTE DEVICE CHECK
Battery Remaining Longevity: 71 mo
Battery Remaining Percentage: 69 %
Battery Voltage: 2.98 V
Brady Statistic RV Percent Paced: 1 %
Date Time Interrogation Session: 20250407020017
HighPow Impedance: 71 Ohm
HighPow Impedance: 71 Ohm
Implantable Lead Connection Status: 753985
Implantable Lead Implant Date: 20220406
Implantable Lead Location: 753860
Implantable Lead Model: 7122
Implantable Pulse Generator Implant Date: 20220406
Lead Channel Impedance Value: 450 Ohm
Lead Channel Pacing Threshold Amplitude: 0.75 V
Lead Channel Pacing Threshold Pulse Width: 0.5 ms
Lead Channel Sensing Intrinsic Amplitude: 11.8 mV
Lead Channel Setting Pacing Amplitude: 2.5 V
Lead Channel Setting Pacing Pulse Width: 0.5 ms
Lead Channel Setting Sensing Sensitivity: 0.5 mV
Pulse Gen Serial Number: 9907684

## 2024-04-30 NOTE — Progress Notes (Signed)
 Remote ICD transmission.

## 2024-04-30 NOTE — Addendum Note (Signed)
 Addended by: Edra Govern D on: 04/30/2024 12:54 PM   Modules accepted: Orders

## 2024-05-06 ENCOUNTER — Other Ambulatory Visit: Payer: Self-pay | Admitting: Cardiovascular Disease

## 2024-05-06 DIAGNOSIS — E782 Mixed hyperlipidemia: Secondary | ICD-10-CM

## 2024-05-29 ENCOUNTER — Other Ambulatory Visit: Payer: Self-pay | Admitting: Internal Medicine

## 2024-06-09 ENCOUNTER — Ambulatory Visit (INDEPENDENT_AMBULATORY_CARE_PROVIDER_SITE_OTHER): Payer: Self-pay

## 2024-06-09 DIAGNOSIS — I469 Cardiac arrest, cause unspecified: Secondary | ICD-10-CM | POA: Diagnosis not present

## 2024-06-11 LAB — CUP PACEART REMOTE DEVICE CHECK
Battery Remaining Longevity: 68 mo
Battery Remaining Percentage: 66 %
Battery Voltage: 2.98 V
Brady Statistic RV Percent Paced: 1 %
Date Time Interrogation Session: 20250707020016
HighPow Impedance: 74 Ohm
HighPow Impedance: 74 Ohm
Implantable Lead Connection Status: 753985
Implantable Lead Implant Date: 20220406
Implantable Lead Location: 753860
Implantable Lead Model: 7122
Implantable Pulse Generator Implant Date: 20220406
Lead Channel Impedance Value: 460 Ohm
Lead Channel Pacing Threshold Amplitude: 0.75 V
Lead Channel Pacing Threshold Pulse Width: 0.5 ms
Lead Channel Sensing Intrinsic Amplitude: 11.8 mV
Lead Channel Setting Pacing Amplitude: 2.5 V
Lead Channel Setting Pacing Pulse Width: 0.5 ms
Lead Channel Setting Sensing Sensitivity: 0.5 mV
Pulse Gen Serial Number: 9907684

## 2024-06-14 ENCOUNTER — Ambulatory Visit: Payer: Self-pay | Admitting: Cardiology

## 2024-08-07 ENCOUNTER — Other Ambulatory Visit: Payer: Self-pay | Admitting: Cardiovascular Disease

## 2024-08-07 DIAGNOSIS — Z95810 Presence of automatic (implantable) cardiac defibrillator: Secondary | ICD-10-CM

## 2024-08-07 DIAGNOSIS — I10 Essential (primary) hypertension: Secondary | ICD-10-CM

## 2024-09-03 ENCOUNTER — Other Ambulatory Visit: Payer: Self-pay | Admitting: Cardiovascular Disease

## 2024-09-03 ENCOUNTER — Other Ambulatory Visit: Payer: Self-pay | Admitting: Internal Medicine

## 2024-09-03 DIAGNOSIS — K219 Gastro-esophageal reflux disease without esophagitis: Secondary | ICD-10-CM

## 2024-09-03 NOTE — Telephone Encounter (Signed)
 Patient need make an appointment for further refills.  Thanks [phone]

## 2024-09-03 NOTE — Telephone Encounter (Signed)
 Patient need make an appointment with her Cardiologist for further refills.  [phone]

## 2024-09-08 ENCOUNTER — Ambulatory Visit: Payer: Self-pay

## 2024-09-08 DIAGNOSIS — I493 Ventricular premature depolarization: Secondary | ICD-10-CM

## 2024-09-10 LAB — CUP PACEART REMOTE DEVICE CHECK
Battery Remaining Longevity: 67 mo
Battery Remaining Percentage: 65 %
Battery Voltage: 2.98 V
Brady Statistic RV Percent Paced: 1 %
Date Time Interrogation Session: 20251006020015
HighPow Impedance: 74 Ohm
HighPow Impedance: 74 Ohm
Implantable Lead Connection Status: 753985
Implantable Lead Implant Date: 20220406
Implantable Lead Location: 753860
Implantable Lead Model: 7122
Implantable Pulse Generator Implant Date: 20220406
Lead Channel Impedance Value: 450 Ohm
Lead Channel Pacing Threshold Amplitude: 0.75 V
Lead Channel Pacing Threshold Pulse Width: 0.5 ms
Lead Channel Sensing Intrinsic Amplitude: 11.8 mV
Lead Channel Setting Pacing Amplitude: 2.5 V
Lead Channel Setting Pacing Pulse Width: 0.5 ms
Lead Channel Setting Sensing Sensitivity: 0.5 mV
Pulse Gen Serial Number: 9907684

## 2024-09-11 NOTE — Progress Notes (Signed)
 Remote ICD Transmission

## 2024-09-15 ENCOUNTER — Ambulatory Visit: Payer: Self-pay | Admitting: Cardiology

## 2024-09-15 NOTE — Progress Notes (Signed)
 Remote ICD Transmission

## 2024-09-25 ENCOUNTER — Other Ambulatory Visit: Payer: Self-pay | Admitting: Cardiovascular Disease

## 2024-09-25 DIAGNOSIS — I10 Essential (primary) hypertension: Secondary | ICD-10-CM

## 2024-09-26 NOTE — Telephone Encounter (Signed)
 Please contact pt for future appointment. Pt due for primary cardiology follow up and EP cardiology follow up. Pt needing refills.

## 2024-09-26 NOTE — Telephone Encounter (Signed)
 No Vm could not LVM

## 2024-09-29 NOTE — Telephone Encounter (Signed)
 LVM to schedule f/u appt

## 2024-10-07 ENCOUNTER — Encounter: Payer: Self-pay | Admitting: Medical

## 2024-10-07 ENCOUNTER — Ambulatory Visit: Attending: Medical | Admitting: Medical

## 2024-10-07 VITALS — BP 142/78 | HR 74 | Ht 66.0 in | Wt 238.2 lb

## 2024-10-07 DIAGNOSIS — I428 Other cardiomyopathies: Secondary | ICD-10-CM

## 2024-10-07 DIAGNOSIS — Z79899 Other long term (current) drug therapy: Secondary | ICD-10-CM

## 2024-10-07 DIAGNOSIS — E782 Mixed hyperlipidemia: Secondary | ICD-10-CM | POA: Diagnosis not present

## 2024-10-07 DIAGNOSIS — N289 Disorder of kidney and ureter, unspecified: Secondary | ICD-10-CM

## 2024-10-07 DIAGNOSIS — I5022 Chronic systolic (congestive) heart failure: Secondary | ICD-10-CM

## 2024-10-07 DIAGNOSIS — I10 Essential (primary) hypertension: Secondary | ICD-10-CM

## 2024-10-07 DIAGNOSIS — I469 Cardiac arrest, cause unspecified: Secondary | ICD-10-CM

## 2024-10-07 DIAGNOSIS — Z95810 Presence of automatic (implantable) cardiac defibrillator: Secondary | ICD-10-CM

## 2024-10-07 MED ORDER — METOPROLOL SUCCINATE ER 50 MG PO TB24: 50.0000 mg | ORAL_TABLET | Freq: Every day | ORAL | 3 refills | Status: AC

## 2024-10-07 MED ORDER — SPIRONOLACTONE 50 MG PO TABS: 50.0000 mg | ORAL_TABLET | Freq: Every day | ORAL | 3 refills | Status: AC

## 2024-10-07 MED ORDER — LOSARTAN POTASSIUM-HCTZ 100-12.5 MG PO TABS: 1.0000 | ORAL_TABLET | Freq: Every day | ORAL | 0 refills | Status: DC

## 2024-10-07 MED ORDER — AMLODIPINE BESYLATE 10 MG PO TABS: 10.0000 mg | ORAL_TABLET | Freq: Every day | ORAL | 0 refills | Status: DC

## 2024-10-07 MED ORDER — ATORVASTATIN CALCIUM 20 MG PO TABS: 20.0000 mg | ORAL_TABLET | Freq: Every day | ORAL | 0 refills | Status: DC

## 2024-10-07 NOTE — Patient Instructions (Signed)
 Medication Instructions:  Your physician recommends that you continue on your current medications as directed. Please refer to the Current Medication list given to you today.    *If you need a refill on your cardiac medications before your next appointment, please call your pharmacy*  Lab Work: Your provider would like for you to have following labs drawn today CBC, CMP, TSH, Lipid, direct LDL.     Testing/Procedures: Your physician has requested that you have an echocardiogram. Echocardiography is a painless test that uses sound waves to create images of your heart. It provides your doctor with information about the size and shape of your heart and how well your heart's chambers and valves are working.   You may receive an ultrasound enhancing agent through an IV if needed to better visualize your heart during the echo. This procedure takes approximately one hour.  There are no restrictions for this procedure.  This will take place at [address] [facility], [place] [address]  Please note: We ask at that you not bring children with you during ultrasound (echo/ vascular) testing. Due to room size and safety concerns, children are not allowed in the ultrasound rooms during exams. Our front office staff cannot provide observation of children in our lobby area while testing is being conducted. An adult accompanying a patient to their appointment will only be allowed in the ultrasound room at the discretion of the ultrasound technician under special circumstances. We apologize for any inconvenience.   Follow-Up: At [facility], you and your health needs are our priority.  As part of our continuing mission to provide you with exceptional heart care, our providers are all part of one team.  This team includes your primary Cardiologist (physician) and Advanced Practice Providers or APPs (Physician Assistants and Nurse Practitioners) who all work together to provide  you with the care you need, when you need it.  Your next appointment:   1 year(s)  Provider:   Evalene Lunger, MD or Cadence Franchester, PA-C

## 2024-10-07 NOTE — Progress Notes (Signed)
 Cardiology Office Note   Date:  10/07/2024  ID:  Julie James, DOB Feb 17, 1951, MRN [id number] PCP: Patient, No Pcp Per  [facility] Providers Cardiologist:  Evalene Lunger, MD Electrophysiologist:  Elspeth Sage, MD (Inactive)   History of Present Illness Julie James is a 73 y.o. female with a history of cardiac arrest status post ICD 03/2021, HFmrEF, renal insufficiency, PVCs, prediabetes who presents for 1 year follow-up of CHF.  The patient presented 02/27/2021 with witnessed and aborted cardiac arrest status post immediate CPR.  911 was called and she was in reportedly a shockable rhythm and she received 2 shocks.  Code STEMI was activated due to concern for anterior precordial lead elevation.  She arrived to the ED with pulses but was unresponsive.  Code STEMI was canceled by Dr. Florencio.  She was intubated in the ER.  She developed wide-complex tachycardia and was started on amiodarone , which was subsequently stopped due to bradycardia.  High-sensitivity troponin 13, 105, 502.  Echo showed EF of 55 to 60%, no wall motion abnormality, mild LVH, grade 1 diastolic dysfunction.  MRI of the brain was nonacute.  Left heart cath showed normal coronaries and normal LVEDP.  Subsequent cardiac MRI showed EF of 46%, no LGE.  She underwent implantation of [employer] Jude single-chamber ICD 03/2021.  She had lead dislodgment requiring surgical repositioning 07/2021.  Today, the patient is overall doing well. She denies chest pain, SOB, lower leg edema, palpitations, heart racing, orthopnea, pnd, lightheadedness, dizziness. Patient does not formal activity. Diet is pretty good. She has not losartan  in a few days. She needs refills. She remembers farxiga  cost was high and it was therefore never started.   Studies Reviewed EKG Interpretation Date/Time:  Tuesday October 07 2024 09:59:13 EST Ventricular Rate:  74 PR Interval:  162 QRS Duration:  146 QT Interval:  424 QTC Calculation: 470 R  Axis:   -21  Text Interpretation: Normal sinus rhythm Left bundle branch block When compared with ECG of 02-Oct-2023 10:05, No significant change was found Confirmed by Franchester, Ahmari Duerson ([id number]) on 10/07/2024 10:06:15 AM    cMRI 2022 IMPRESSION: 1. Normal LV size with mild concentric LV hypertrophy. Septal-lateral dyssynchrony consistent with LBBB, also mild inferior and inferolateral hypokinesis. EF 46%.   2.  Normal RV size and systolic function, EF 49%.   3. No myocardial LGE, so no definitive evidence for prior MI, infiltrative disease, or myocarditis. Normal extracellular fluid volume percentage.   _______  [facility] 2022  The left ventricular systolic function is normal. LV end diastolic pressure is normal. The left ventricular ejection fraction is 55-65% by visual estimate.   1.  Normal coronary arteries. 2.  Normal LV systolic function and left ventricular end-diastolic pressure.  Wall motion could not be fully evaluated.   Recommendations: No ischemic etiology for cardiac arrest. Continue evaluation by electrophysiology.   _______  Echo 02/2021  1. Left ventricular ejection fraction, by estimation, is 55 to 60%. The  left ventricle has normal function. The left ventricle has no regional  wall motion abnormalities. There is mild left ventricular hypertrophy.  Left ventricular diastolic parameters  are consistent with Grade I diastolic dysfunction (impaired relaxation).   2. Right ventricular systolic function is normal. The right ventricular  size is normal. Tricuspid regurgitation signal is inadequate for assessing  PA pressure.   3. The mitral valve is normal in structure. No evidence of mitral valve  regurgitation. No evidence of mitral stenosis.   4. The aortic valve is normal  in structure. Aortic valve regurgitation is  not visualized. Mild aortic valve sclerosis is present, with no evidence  of aortic valve stenosis.   5. The inferior vena cava is normal in size with  greater than 50%  respiratory variability, suggesting right atrial pressure of 3 mmHg.   6. Challenging image quality.       Physical Exam VS:  BP (!) 142/78 (BP Location: Left Arm, Patient Position: Sitting, Cuff Size: Normal)   Pulse 74   Ht 5' 6 (1.676 m)   Wt 238 lb 3.2 oz (108 kg)   SpO2 97%   BMI 38.45 kg/m        Wt Readings from Last 3 Encounters:  10/07/24 238 lb 3.2 oz (108 kg)  10/02/23 240 lb 6 oz (109 kg)  08/10/23 242 lb 4 oz (109.9 kg)    GEN: Well nourished, well developed in no acute distress NECK: No JVD; No carotid bruits CARDIAC: RRR, no murmurs, rubs, gallops RESPIRATORY:  Clear to auscultation without rales, wheezing or rhonchi  ABDOMEN: Soft, non-tender, non-distended EXTREMITIES:  No edema; No deformity   ASSESSMENT AND PLAN  HFmrEF NICM In the setting of cardiac arrest echo 02/2021 showed EF of 55 to 60%, mild LVH, grade 1 diastolic dysfunction.  Left heart cath showed nonobstructive CAD.  Cardiac MRI showed EF of 46%, no myocardial LGE, no no definitive evidence for prior MI, infiltrative disease, or myocarditis.  She has a chronic left bundle branch block.  Patient was started on GDMT.  Patient is euvolemic on exam.  She has needed refills of all of her cardiac medications today.  I will refill losartan -HCTZ 100-12.5 mg daily, Toprol  50 mg daily, spironolactone  50 mg daily.  Farxiga  started by Dr. Fernande at last appointment, but there were issues with cost.  I will update an echocardiogram.  If pump function is reduced, we will can consider restarting Farxiga .  I will update CBC, c-Met, TSH, lipids.  Hypertension Blood pressure is mildly elevated today, but she has been off losartan  as hydrochlorothiazide  for a few days.  Continue amlodipine  10 mg daily, losartan -hydrochlorothiazide  100-12.5 mg daily, Toprol  50 mg daily, spironolactone  50 mg daily.  I will send in refills as above.  Hyperlipidemia Recheck lipids today.  Continue Lipitor 20 mg  daily.  Cardiac Arrest s/p ICD Continue aspirin  81 mg daily.  She is followed by EP, she has appointment with Dr. Kennyth next year.  Most recent device interrogation showed normally functioning device.  CKD Stage 3 CMET today.       Dispo: Follow-up in 1 year  Signed, Isella Slatten VEAR Fishman, PA-C

## 2024-10-08 ENCOUNTER — Other Ambulatory Visit: Payer: Self-pay | Admitting: Cardiovascular Disease

## 2024-10-08 DIAGNOSIS — K219 Gastro-esophageal reflux disease without esophagitis: Secondary | ICD-10-CM

## 2024-10-08 LAB — COMPREHENSIVE METABOLIC PANEL WITH GFR
ALT: 11 IU/L (ref 0–32)
AST: 19 IU/L (ref 0–40)
Albumin: 4.4 g/dL (ref 3.8–4.8)
Alkaline Phosphatase: 105 IU/L (ref 49–135)
BUN/Creatinine Ratio: 21 (ref 12–28)
BUN: 23 mg/dL (ref 8–27)
Bilirubin Total: 0.3 mg/dL (ref 0.0–1.2)
CO2: 23 mmol/L (ref 20–29)
Calcium: 10.1 mg/dL (ref 8.7–10.3)
Chloride: 102 mmol/L (ref 96–106)
Creatinine, Ser: 1.11 mg/dL — ABNORMAL HIGH (ref 0.57–1.00)
Globulin, Total: 3.2 g/dL (ref 1.5–4.5)
Glucose: 92 mg/dL (ref 70–99)
Potassium: 5.1 mmol/L (ref 3.5–5.2)
Sodium: 138 mmol/L (ref 134–144)
Total Protein: 7.6 g/dL (ref 6.0–8.5)
eGFR: 53 mL/min/1.73 — ABNORMAL LOW (ref >59)

## 2024-10-08 LAB — CBC
Hematocrit: 34.5 % (ref 34.0–46.6)
Hemoglobin: 10.4 g/dL — ABNORMAL LOW (ref 11.1–15.9)
MCH: 28 pg (ref 26.6–33.0)
MCHC: 30.1 g/dL — ABNORMAL LOW (ref 31.5–35.7)
MCV: 93 fL (ref 79–97)
Platelets: 390 x10E3/uL (ref 150–450)
RBC: 3.71 x10E6/uL — ABNORMAL LOW (ref 3.77–5.28)
RDW: 13.1 % (ref 11.7–15.4)
WBC: 6.2 x10E3/uL (ref 3.4–10.8)

## 2024-10-08 LAB — LIPID PANEL
Chol/HDL Ratio: 4.1 ratio (ref 0.0–4.4)
Cholesterol, Total: 148 mg/dL (ref 100–199)
HDL: 36 mg/dL — ABNORMAL LOW (ref >39)
LDL Chol Calc (NIH): 96 mg/dL (ref 0–99)
Triglycerides: 85 mg/dL (ref 0–149)
VLDL Cholesterol Cal: 16 mg/dL (ref 5–40)

## 2024-10-08 LAB — TSH: TSH: 2.78 u[IU]/mL (ref 0.450–4.500)

## 2024-10-08 LAB — LDL CHOLESTEROL, DIRECT: LDL Direct: 97 mg/dL (ref 0–99)

## 2024-10-09 ENCOUNTER — Ambulatory Visit: Payer: Self-pay | Admitting: Medical

## 2024-10-27 ENCOUNTER — Other Ambulatory Visit: Payer: Self-pay | Admitting: Medical

## 2024-10-27 NOTE — Telephone Encounter (Signed)
 Requested Prescriptions   Signed Prescriptions Disp Refills   amLODipine  (NORVASC ) 10 MG tablet 90 tablet 3    Sig: Take 1 tablet (10 mg total) by mouth daily.    Authorizing Provider: FRANCHESTER MIKEY DEL    Ordering User: WILFRED, Lucita Montoya  C   losartan -hydrochlorothiazide  (HYZAAR) 100-12.5 MG tablet 90 tablet 3    Sig: Take 1 tablet by mouth daily.    Authorizing Provider: FRANCHESTER MIKEY DEL    Ordering User: WILFRED, Mikaiah Stoffer  C

## 2024-11-23 ENCOUNTER — Emergency Department
Admission: EM | Admit: 2024-11-23 | Discharge: 2024-11-23 | Disposition: A | Discharge status: Home / Self Care | Attending: Emergency Medicine | Admitting: Emergency Medicine

## 2024-11-23 ENCOUNTER — Other Ambulatory Visit: Payer: Self-pay

## 2024-11-23 ENCOUNTER — Encounter: Payer: Self-pay | Admitting: Emergency Medicine

## 2024-11-23 DIAGNOSIS — I10 Essential (primary) hypertension: Secondary | ICD-10-CM | POA: Insufficient documentation

## 2024-11-23 DIAGNOSIS — R059 Cough, unspecified: Secondary | ICD-10-CM | POA: Diagnosis present

## 2024-11-23 DIAGNOSIS — J101 Influenza due to other identified influenza virus with other respiratory manifestations: Secondary | ICD-10-CM | POA: Insufficient documentation

## 2024-11-23 LAB — RESP PANEL BY RT-PCR (RSV, FLU A&B, COVID)  RVPGX2
Influenza A by PCR: POSITIVE — AB
Influenza B by PCR: NEGATIVE
Resp Syncytial Virus by PCR: NEGATIVE
SARS Coronavirus 2 by RT PCR: NEGATIVE

## 2024-11-23 MED ORDER — OSELTAMIVIR PHOSPHATE 75 MG PO CAPS
75.0000 mg | ORAL_CAPSULE | Freq: Once | ORAL | Status: AC
  Administered 2024-11-23: 75 mg via ORAL
  Filled 2024-11-23: qty 1

## 2024-11-23 MED ORDER — OSELTAMIVIR PHOSPHATE 75 MG PO CAPS: 75.0000 mg | ORAL_CAPSULE | Freq: Two times a day (BID) | ORAL | 0 refills | Status: AC

## 2024-11-23 MED ORDER — IBUPROFEN 400 MG PO TABS
400.0000 mg | ORAL_TABLET | Freq: Once | ORAL | Status: AC
  Administered 2024-11-23: 400 mg via ORAL
  Filled 2024-11-23: qty 1

## 2024-11-23 NOTE — ED Provider Notes (Signed)
 "  [facility] Provider Note    Event Date/Time   First MD Initiated Contact with Patient 11/23/24 1857     (approximate)   History   Cough and Fever   HPI  Julie James is a 73 y.o. female history of hyper tension, hypercholesterolemia, cardiac arrest presents emergency department with      Physical Exam   Triage Vital Signs: ED Triage Vitals  Encounter Vitals Group     BP 11/23/24 1701 139/71     Girls Systolic BP Percentile --      Girls Diastolic BP Percentile --      Boys Systolic BP Percentile --      Boys Diastolic BP Percentile --      Pulse Rate 11/23/24 1701 100     Resp 11/23/24 1701 18     Temp 11/23/24 1701 (!) 100.5 F (38.1 C)     Temp Source 11/23/24 1701 Oral     SpO2 11/23/24 1701 95 %     Weight 11/23/24 1702 238 lb 1.6 oz (108 kg)     Height 11/23/24 1702 5' 6 (1.676 m)     Head Circumference --      Peak Flow --      Pain Score 11/23/24 1701 0     Pain Loc --      Pain Education --      Exclude from Growth Chart --     Most recent vital signs: Vitals:   11/23/24 1701 11/23/24 1932  BP: 139/71 133/80  Pulse: 100 91  Resp: 18   Temp: (!) 100.5 F (38.1 C) 99.7 F (37.6 C)  SpO2: 95% 100%     General: Awake, no distress.   CV:  Good peripheral perfusion. regular rate and  rhythm Resp:  Normal effort. Lungs cta Abd:  No distention.   Other:      ED Results / Procedures / Treatments   Labs (all labs ordered are listed, but only abnormal results are displayed) Labs Reviewed  RESP PANEL BY RT-PCR (RSV, FLU A&B, COVID)  RVPGX2 - Abnormal; Notable for the following components:      Result Value   Influenza A by PCR POSITIVE (*)    All other components within normal limits     EKG     RADIOLOGY     PROCEDURES:   Procedures  Critical Care:  no Chief Complaint  Patient presents with   Cough   Fever      MEDICATIONS ORDERED IN ED: Medications  ibuprofen  (ADVIL ) tablet 400 mg  (400 mg Oral Given 11/23/24 1748)  oseltamivir  (TAMIFLU ) capsule 75 mg (75 mg Oral Given 11/23/24 1919)     IMPRESSION / MDM / ASSESSMENT AND PLAN / ED COURSE  I reviewed the triage vital signs and the nursing notes.                              Differential diagnosis includes, but is not limited to, COVID, influenza, RSV, CAP, acute URI  Patient's presentation is most consistent with acute illness / injury with system symptoms.    Medications given Tamiflu  1 p.o.  Respiratory panel positive for influenza  With the patient's past medical history we will go ahead and treat her with Tamiflu  by giving her Tamiflu  75 mg here in the ED.  Prescription sent to her pharmacy.  I do not see any red flags to warrant any further  workup at this time.  Her symptoms are very consistent with influenza.  She is to follow-up with her regular doctor if not improving in 2 to 3 days.  Return if worsening.  She is in agreement treatment plan.  Discharged stable condition.      FINAL CLINICAL IMPRESSION(S) / ED DIAGNOSES   Final diagnoses:  Influenza A     Rx / DC Orders   ED Discharge Orders          Ordered    oseltamivir  (TAMIFLU ) 75 MG capsule  2 times daily        11/23/24 1912             Note:  This document was prepared using Dragon voice recognition software and may include unintentional dictation errors.    Gasper Devere ORN, PA-C 11/23/24 2119  "

## 2024-11-23 NOTE — ED Triage Notes (Signed)
 Pt reports cough and fever since last night. Family here for same. Tylenol  last took around 3 pm.

## 2024-11-25 ENCOUNTER — Ambulatory Visit

## 2024-12-08 ENCOUNTER — Ambulatory Visit: Payer: Self-pay

## 2024-12-08 DIAGNOSIS — I5022 Chronic systolic (congestive) heart failure: Secondary | ICD-10-CM

## 2024-12-10 LAB — CUP PACEART REMOTE DEVICE CHECK
Battery Remaining Longevity: 65 mo
Battery Remaining Percentage: 63 %
Battery Voltage: 2.98 V
Brady Statistic RV Percent Paced: 1 %
Date Time Interrogation Session: 20260105020016
HighPow Impedance: 87 Ohm
HighPow Impedance: 87 Ohm
Implantable Lead Connection Status: 753985
Implantable Lead Implant Date: 20220406
Implantable Lead Location: 753860
Implantable Lead Model: 7122
Implantable Pulse Generator Implant Date: 20220406
Lead Channel Impedance Value: 450 Ohm
Lead Channel Pacing Threshold Amplitude: 0.75 V
Lead Channel Pacing Threshold Pulse Width: 0.5 ms
Lead Channel Sensing Intrinsic Amplitude: 11.8 mV
Lead Channel Setting Pacing Amplitude: 2.5 V
Lead Channel Setting Pacing Pulse Width: 0.5 ms
Lead Channel Setting Sensing Sensitivity: 0.5 mV
Pulse Gen Serial Number: 9907684

## 2024-12-12 NOTE — Progress Notes (Signed)
 Remote ICD Transmission

## 2024-12-13 ENCOUNTER — Ambulatory Visit: Payer: Self-pay | Admitting: Cardiology

## 2025-01-07 NOTE — Progress Notes (Unsigned)
 "     Electrophysiology Clinic Note    Date:  01/08/2025  Patient ID:  Julie James, DOB [birth year], MRN [id number] PCP:  Patient, No Pcp Per  Cardiologist:  Evalene Lunger, MD  Electrophysiologist:  Fonda Kitty, MD  Electrophysiology APP:  Denis Carreon, NP    Discussed the use of AI scribe software for clinical note transcription with the patient, who gave verbal consent to proceed.   Patient Profile    Chief Complaint: ICD follow-up  History of Present Illness: Julie James is a 74 y.o. female with PMH notable for aborted cardiac arrest s/p ICD, PVCs, HFmrEF, HLD, HTN; seen today for Fonda Kitty, MD (Previously Dr. Fernande) for routine electrophysiology followup.   She last saw Dr. Fernande 09/2023 at which time she was overall doing well but did have complaints of dyspnea with 1 flight of stairs.  She has seen general cardiology in the interim.   Today, she overall feels well. She has no complaints related to her device. She denies ches tpain, chest pressure, palpitations. She has made a concerted effort to eat better since the new year, is now planning to increase physical activity.     Arrhythmia/Device History St. Jude single chamber ICD, imp 2022; dx cardiac arrest    ROS:  Please see the history of present illness. All other systems are reviewed and otherwise negative.    Physical Exam    VS:  BP (!) 140/72 (BP Location: Left Arm, Patient Position: Sitting, Cuff Size: Normal)   Pulse 74   Ht 5' 6 (1.676 m)   Wt 239 lb 9.6 oz (108.7 kg)   SpO2 97%   BMI 38.67 kg/m  BMI: Body mass index is 38.67 kg/m.           Wt Readings from Last 3 Encounters:  01/08/25 239 lb 9.6 oz (108.7 kg)  11/23/24 238 lb 1.6 oz (108 kg)  10/07/24 238 lb 3.2 oz (108 kg)     GEN- The patient is well appearing, alert and oriented x 3 today.   Lungs- Clear to ausculation bilaterally, normal work of breathing.  Heart- Regular rate and rhythm, no murmurs, rubs or  gallops Extremities- No peripheral edema, warm, dry Skin- device pocket well-healed, no tethering   Device interrogation done today and reviewed by myself:  Battery 5.4 years Lead thresholds, impedence, sensing stable  No episodes No changes made today   Studies Reviewed   Previous EP, cardiology notes.    EKG is ordered. Personal review of EKG from today shows:    EKG Interpretation Date/Time:  Thursday January 08 2025 10:01:41 EST Ventricular Rate:  74 PR Interval:  166 QRS Duration:  152 QT Interval:  428 QTC Calculation: 475 R Axis:   -21  Text Interpretation: Normal sinus rhythm Left bundle branch block Confirmed by Samuell Knoble [phone]) on 01/08/2025 10:38:09 AM    Long term monitor, 06/29/2021 Patch Wear Time:  3 days and 0 hours (2022-07-19T11:21:27-0400 to 2022-07-22T11:36:18-0400)   Patient had a min HR of 52 bpm, max HR of 135 bpm, and avg HR of 67 bpm. Predominant underlying rhythm was Sinus Rhythm. Bundle Branch Block/IVCD was present,QRS morphology changes were present. Isolated SVEs were rare (<1.0%), and no SVE Couplets or SVE  Triplets were present. Isolated VEs were occasional (3.2%, 9144), VE Couplets were rare (<1.0%, 309), and VE Triplets were rare (<1.0%, 1). Ventricular Bigeminy and Trigeminy were present.  Cardiac MRI, 03/07/2021 1. Normal LV size with mild concentric LV hypertrophy. Septal-lateral  dyssynchrony consistent with LBBB, also mild inferior and inferolateral hypokinesis. EF 46%.  2.  Normal RV size and systolic function, EF 49%.   3. No myocardial LGE, so no definitive evidence for prior MI, infiltrative disease, or myocarditis. Normal extracellular fluid volume percentage.  LHC, 03/04/2021 The left ventricular systolic function is normal. LV end diastolic pressure is normal. The left ventricular ejection fraction is 55-65% by visual estimate.   1.  Normal coronary arteries. 2.  Normal LV systolic function and left ventricular end-diastolic  pressure.  Wall motion could not be fully evaluated.  TTE, 03/02/2021  1. Left ventricular ejection fraction, by estimation, is 55 to 60%. The left ventricle has normal function. The left ventricle has no regional wall motion abnormalities. There is mild left ventricular hypertrophy. Left ventricular diastolic parameters are consistent with Grade I diastolic dysfunction (impaired relaxation).   2. Right ventricular systolic function is normal. The right ventricular size is normal. Tricuspid regurgitation signal is inadequate for assessing PA pressure.   3. The mitral valve is normal in structure. No evidence of mitral valve regurgitation. No evidence of mitral stenosis.   4. The aortic valve is normal in structure. Aortic valve regurgitation is not visualized. Mild aortic valve sclerosis is present, with no evidence of aortic valve stenosis.   5. The inferior vena cava is normal in size with greater than 50% respiratory variability, suggesting right atrial pressure of 3 mmHg.   6. Challenging image quality.    Assessment and Plan     #) cardiac arrest s/p ICD No concerning arrhythmias on ICD Battery good Stable lead measurements VP < 1%    #) HFmrEF #) HTN Appears warm and euvolemic today BP slightly elevated, she does not have BP cuff or check readings at home I encouraged her to obtain BP cuff and check BP 2-3 times per week and record measurements. Bring BP long to follow-up appts Encouraged increased physical activity Continue 10mg  amlodipine , 100-12.5mg  hyzaar, 50mg  spiro, 50mg  toprol  daily   #) HLD Refill of atorvastatin  provided   Current medicines are reviewed at length with the patient today.   The patient does not have concerns regarding her medicines.  The following changes were made today:  none  Labs/ tests ordered today include:  Orders Placed This Encounter  Procedures   EKG 12-Lead     Disposition: Follow up with Dr. Kennyth or EP APP in 12 months, continue  remote monitoring   Signed, Chantal Needle, NP  01/08/25  10:39 AM  Electrophysiology [facility] "

## 2025-01-08 ENCOUNTER — Encounter: Payer: Self-pay | Admitting: Cardiology

## 2025-01-08 ENCOUNTER — Ambulatory Visit: Admitting: Cardiology

## 2025-01-08 VITALS — BP 140/72 | HR 74 | Ht 66.0 in | Wt 239.6 lb

## 2025-01-08 DIAGNOSIS — I469 Cardiac arrest, cause unspecified: Secondary | ICD-10-CM

## 2025-01-08 DIAGNOSIS — I5022 Chronic systolic (congestive) heart failure: Secondary | ICD-10-CM | POA: Diagnosis not present

## 2025-01-08 DIAGNOSIS — I428 Other cardiomyopathies: Secondary | ICD-10-CM

## 2025-01-08 DIAGNOSIS — I493 Ventricular premature depolarization: Secondary | ICD-10-CM

## 2025-01-08 DIAGNOSIS — E782 Mixed hyperlipidemia: Secondary | ICD-10-CM

## 2025-01-08 DIAGNOSIS — I10 Essential (primary) hypertension: Secondary | ICD-10-CM

## 2025-01-08 DIAGNOSIS — Z95810 Presence of automatic (implantable) cardiac defibrillator: Secondary | ICD-10-CM

## 2025-01-08 LAB — CUP PACEART INCLINIC DEVICE CHECK
Battery Remaining Longevity: 66 mo
Brady Statistic RV Percent Paced: 0 %
Date Time Interrogation Session: 20260205104302
HighPow Impedance: 76.5 Ohm
Implantable Lead Connection Status: 753985
Implantable Lead Implant Date: 20220406
Implantable Lead Location: 753860
Implantable Lead Model: 7122
Implantable Pulse Generator Implant Date: 20220406
Lead Channel Impedance Value: 512.5 Ohm
Lead Channel Pacing Threshold Amplitude: 1.25 V
Lead Channel Pacing Threshold Amplitude: 1.25 V
Lead Channel Pacing Threshold Pulse Width: 0.5 ms
Lead Channel Pacing Threshold Pulse Width: 0.5 ms
Lead Channel Sensing Intrinsic Amplitude: 11.8 mV
Lead Channel Setting Pacing Amplitude: 2.5 V
Lead Channel Setting Pacing Pulse Width: 0.5 ms
Lead Channel Setting Sensing Sensitivity: 0.5 mV
Pulse Gen Serial Number: 9907684

## 2025-01-08 MED ORDER — ATORVASTATIN CALCIUM 20 MG PO TABS: 20.0000 mg | ORAL_TABLET | Freq: Every day | ORAL | 3 refills | Status: AC

## 2025-01-08 NOTE — Patient Instructions (Signed)
 Medication Instructions:  Your physician recommends that you continue on your current medications as directed. Please refer to the Current Medication list given to you today.  *If you need a refill on your cardiac medications before your next appointment, please call your pharmacy*  Lab Work: No labs ordered today    Testing/Procedures: No test ordered today   Follow-Up:  Home Blood Pressures.   At [facility], you and your health needs are our priority.  As part of our continuing mission to provide you with exceptional heart care, our providers are all part of one team.  This team includes your primary Cardiologist (physician) and Advanced Practice Providers or APPs (Physician Assistants and Nurse Practitioners) who all work together to provide you with the care you need, when you need it.  Your next appointment:   1 year(s)  Provider:   Suzann Riddle, NP

## 2025-01-13 ENCOUNTER — Ambulatory Visit: Admitting: Cardiology
